# Patient Record
Sex: Female | Born: 1955
Health system: Southern US, Community
[De-identification: ages and names within clinical notes are randomized; demographics above are authoritative.]

## PROBLEM LIST (undated history)

## (undated) DIAGNOSIS — Z1379 Encounter for other screening for genetic and chromosomal anomalies: Principal | ICD-10-CM

## (undated) DIAGNOSIS — Z973 Presence of spectacles and contact lenses: Secondary | ICD-10-CM

## (undated) DIAGNOSIS — Z923 Personal history of irradiation: Secondary | ICD-10-CM

## (undated) DIAGNOSIS — H409 Unspecified glaucoma: Secondary | ICD-10-CM

## (undated) DIAGNOSIS — Z87442 Personal history of urinary calculi: Secondary | ICD-10-CM

## (undated) DIAGNOSIS — C801 Malignant (primary) neoplasm, unspecified: Secondary | ICD-10-CM

## (undated) DIAGNOSIS — M549 Dorsalgia, unspecified: Secondary | ICD-10-CM

## (undated) DIAGNOSIS — R21 Rash and other nonspecific skin eruption: Secondary | ICD-10-CM

## (undated) DIAGNOSIS — I10 Essential (primary) hypertension: Secondary | ICD-10-CM

## (undated) DIAGNOSIS — K219 Gastro-esophageal reflux disease without esophagitis: Secondary | ICD-10-CM

## (undated) DIAGNOSIS — G8929 Other chronic pain: Secondary | ICD-10-CM

## (undated) DIAGNOSIS — N611 Abscess of the breast and nipple: Secondary | ICD-10-CM

## (undated) DIAGNOSIS — F329 Major depressive disorder, single episode, unspecified: Secondary | ICD-10-CM

## (undated) DIAGNOSIS — F32A Depression, unspecified: Secondary | ICD-10-CM

## (undated) DIAGNOSIS — F419 Anxiety disorder, unspecified: Secondary | ICD-10-CM

## (undated) DIAGNOSIS — E119 Type 2 diabetes mellitus without complications: Secondary | ICD-10-CM

## (undated) DIAGNOSIS — Z972 Presence of dental prosthetic device (complete) (partial): Secondary | ICD-10-CM

## (undated) DIAGNOSIS — K861 Other chronic pancreatitis: Secondary | ICD-10-CM

## (undated) DIAGNOSIS — E7800 Pure hypercholesterolemia, unspecified: Secondary | ICD-10-CM

## (undated) HISTORY — DX: Encounter for other screening for genetic and chromosomal anomalies: Z13.79

## (undated) HISTORY — PX: BREAST EXCISIONAL BIOPSY: SUR124

## (undated) HISTORY — PX: BREAST REDUCTION SURGERY: SHX8

## (undated) HISTORY — DX: Unspecified glaucoma: H40.9

## (undated) HISTORY — PX: TUBAL LIGATION: SHX77

## (undated) HISTORY — PX: PARTIAL HYSTERECTOMY: SHX80

## (undated) HISTORY — DX: Other chronic pain: G89.29

## (undated) HISTORY — DX: Pure hypercholesterolemia, unspecified: E78.00

## (undated) HISTORY — DX: Other chronic pancreatitis: K86.1

## (undated) HISTORY — PX: ABDOMINAL HYSTERECTOMY: SHX81

## (undated) HISTORY — DX: Essential (primary) hypertension: I10

## (undated) HISTORY — PX: APPENDECTOMY: SHX54

## (undated) HISTORY — PX: REDUCTION MAMMAPLASTY: SUR839

## (undated) HISTORY — PX: COLONOSCOPY W/ BIOPSIES AND POLYPECTOMY: SHX1376

## (undated) HISTORY — PX: MULTIPLE TOOTH EXTRACTIONS: SHX2053

## (undated) HISTORY — DX: Dorsalgia, unspecified: M54.9

## (undated) HISTORY — PX: OTHER SURGICAL HISTORY: SHX169

## (undated) HISTORY — PX: CHOLECYSTECTOMY: SHX55

## (undated) HISTORY — DX: Type 2 diabetes mellitus without complications: E11.9

## (undated) HISTORY — PX: BACK SURGERY: SHX140

---

## 2004-01-13 ENCOUNTER — Ambulatory Visit: Payer: Self-pay | Admitting: Internal Medicine

## 2004-03-16 ENCOUNTER — Ambulatory Visit: Payer: Self-pay | Admitting: Internal Medicine

## 2004-04-15 ENCOUNTER — Ambulatory Visit: Payer: Self-pay | Admitting: Internal Medicine

## 2004-04-15 ENCOUNTER — Ambulatory Visit (HOSPITAL_COMMUNITY): Admission: RE | Admit: 2004-04-15 | Discharge: 2004-04-15 | Payer: Self-pay | Admitting: Internal Medicine

## 2004-04-22 ENCOUNTER — Ambulatory Visit: Payer: Self-pay | Admitting: Internal Medicine

## 2006-01-27 ENCOUNTER — Inpatient Hospital Stay (HOSPITAL_COMMUNITY): Admission: RE | Admit: 2006-01-27 | Discharge: 2006-01-29 | Payer: Self-pay | Admitting: Neurosurgery

## 2009-04-24 ENCOUNTER — Encounter: Admission: RE | Admit: 2009-04-24 | Discharge: 2009-04-24 | Payer: Self-pay | Admitting: Neurosurgery

## 2009-07-03 ENCOUNTER — Ambulatory Visit (HOSPITAL_COMMUNITY): Admission: RE | Admit: 2009-07-03 | Discharge: 2009-07-04 | Payer: Self-pay | Admitting: Neurosurgery

## 2009-08-17 ENCOUNTER — Encounter: Admission: RE | Admit: 2009-08-17 | Discharge: 2009-08-17 | Payer: Self-pay | Admitting: Neurosurgery

## 2009-10-27 ENCOUNTER — Encounter: Admission: RE | Admit: 2009-10-27 | Discharge: 2009-10-27 | Payer: Self-pay | Admitting: Neurosurgery

## 2009-12-08 ENCOUNTER — Ambulatory Visit: Payer: Self-pay | Admitting: Thoracic Surgery

## 2009-12-15 ENCOUNTER — Ambulatory Visit: Payer: Self-pay | Admitting: Thoracic Surgery

## 2009-12-15 ENCOUNTER — Inpatient Hospital Stay (HOSPITAL_COMMUNITY): Admission: RE | Admit: 2009-12-15 | Discharge: 2009-12-21 | Payer: Self-pay | Admitting: Neurosurgery

## 2009-12-29 ENCOUNTER — Encounter: Admission: RE | Admit: 2009-12-29 | Discharge: 2009-12-29 | Payer: Self-pay | Admitting: Thoracic Surgery

## 2009-12-29 ENCOUNTER — Ambulatory Visit: Payer: Self-pay | Admitting: Thoracic Surgery

## 2010-01-01 ENCOUNTER — Ambulatory Visit (HOSPITAL_COMMUNITY): Admission: RE | Admit: 2010-01-01 | Discharge: 2010-01-01 | Payer: Self-pay | Admitting: Thoracic Surgery

## 2010-01-05 ENCOUNTER — Encounter: Admission: RE | Admit: 2010-01-05 | Discharge: 2010-01-05 | Payer: Self-pay | Admitting: Thoracic Surgery

## 2010-01-05 ENCOUNTER — Ambulatory Visit: Payer: Self-pay | Admitting: Thoracic Surgery

## 2010-01-20 ENCOUNTER — Ambulatory Visit: Payer: Self-pay | Admitting: Thoracic Surgery

## 2010-01-20 ENCOUNTER — Encounter
Admission: RE | Admit: 2010-01-20 | Discharge: 2010-01-20 | Payer: Self-pay | Source: Home / Self Care | Attending: Thoracic Surgery | Admitting: Thoracic Surgery

## 2010-02-24 ENCOUNTER — Encounter
Admission: RE | Admit: 2010-02-24 | Discharge: 2010-02-24 | Payer: Self-pay | Source: Home / Self Care | Attending: Thoracic Surgery | Admitting: Thoracic Surgery

## 2010-02-24 ENCOUNTER — Ambulatory Visit
Admission: RE | Admit: 2010-02-24 | Discharge: 2010-02-24 | Payer: Self-pay | Source: Home / Self Care | Attending: Thoracic Surgery | Admitting: Thoracic Surgery

## 2010-03-31 ENCOUNTER — Other Ambulatory Visit: Payer: Self-pay | Admitting: Neurosurgery

## 2010-03-31 DIAGNOSIS — M549 Dorsalgia, unspecified: Secondary | ICD-10-CM

## 2010-03-31 DIAGNOSIS — M545 Low back pain: Secondary | ICD-10-CM

## 2010-04-20 LAB — DIFFERENTIAL
Eosinophils Absolute: 0.3 10*3/uL (ref 0.0–0.7)
Eosinophils Relative: 4 % (ref 0–5)
Lymphs Abs: 2.8 10*3/uL (ref 0.7–4.0)

## 2010-04-20 LAB — BASIC METABOLIC PANEL
BUN: 5 mg/dL — ABNORMAL LOW (ref 6–23)
CO2: 27 mEq/L (ref 19–32)
CO2: 28 mEq/L (ref 19–32)
Calcium: 8.5 mg/dL (ref 8.4–10.5)
Chloride: 106 mEq/L (ref 96–112)
Creatinine, Ser: 0.73 mg/dL (ref 0.4–1.2)
Creatinine, Ser: 0.79 mg/dL (ref 0.4–1.2)
GFR calc Af Amer: >60 mL/min (ref >60)
Glucose, Bld: 111 mg/dL — ABNORMAL HIGH (ref 70–99)
Glucose, Bld: 124 mg/dL — ABNORMAL HIGH (ref 70–99)

## 2010-04-20 LAB — COMPREHENSIVE METABOLIC PANEL
ALT: 31 U/L (ref 0–35)
Albumin: 3 g/dL — ABNORMAL LOW (ref 3.5–5.2)
Alkaline Phosphatase: 63 U/L (ref 39–117)
BUN: 11 mg/dL (ref 6–23)
Calcium: 8.1 mg/dL — ABNORMAL LOW (ref 8.4–10.5)
GFR calc non Af Amer: >60 mL/min (ref >60)
Glucose, Bld: 101 mg/dL — ABNORMAL HIGH (ref 70–99)
Potassium: 4.3 mEq/L (ref 3.5–5.1)
Total Protein: 5.5 g/dL — ABNORMAL LOW (ref 6.0–8.3)

## 2010-04-20 LAB — CBC
HCT: 38.4 % (ref 36.0–46.0)
Hemoglobin: 12.4 g/dL (ref 12.0–15.0)
MCH: 29 pg (ref 26.0–34.0)
MCH: 29.4 pg (ref 26.0–34.0)
MCHC: 32.3 g/dL (ref 30.0–36.0)
MCHC: 33.8 g/dL (ref 30.0–36.0)
MCV: 87.1 fL (ref 78.0–100.0)
Platelets: 251 10*3/uL (ref 150–400)
RBC: 5.27 MIL/uL — ABNORMAL HIGH (ref 3.87–5.11)

## 2010-04-20 LAB — URINE CULTURE
Culture  Setup Time: 201111080000
Culture: NO GROWTH

## 2010-04-20 LAB — TYPE AND SCREEN
ABO/RH(D): A NEG
Antibody Screen: NEGATIVE
Unit division: 0
Unit division: 0

## 2010-04-20 LAB — URINALYSIS, ROUTINE W REFLEX MICROSCOPIC
Bilirubin Urine: NEGATIVE
Ketones, ur: NEGATIVE mg/dL
Nitrite: NEGATIVE
Urobilinogen, UA: 0.2 mg/dL (ref 0.0–1.0)

## 2010-04-20 LAB — SURGICAL PCR SCREEN: Staphylococcus aureus: NEGATIVE

## 2010-04-26 LAB — BASIC METABOLIC PANEL
CO2: 28 mEq/L (ref 19–32)
Calcium: 9.7 mg/dL (ref 8.4–10.5)
GFR calc Af Amer: >60 mL/min (ref >60)
Sodium: 139 mEq/L (ref 135–145)

## 2010-04-26 LAB — GLUCOSE, CAPILLARY
Glucose-Capillary: 104 mg/dL — ABNORMAL HIGH (ref 70–99)
Glucose-Capillary: 162 mg/dL — ABNORMAL HIGH (ref 70–99)

## 2010-04-26 LAB — TYPE AND SCREEN
ABO/RH(D): A NEG
Antibody Screen: NEGATIVE

## 2010-04-26 LAB — CBC
Hemoglobin: 15.6 g/dL — ABNORMAL HIGH (ref 12.0–15.0)
MCHC: 34.3 g/dL (ref 30.0–36.0)
RBC: 5.04 MIL/uL (ref 3.87–5.11)
WBC: 9.8 10*3/uL (ref 4.0–10.5)

## 2010-04-26 LAB — DIFFERENTIAL
Basophils Relative: 1 % (ref 0–1)
Lymphocytes Relative: 34 % (ref 12–46)
Lymphs Abs: 3.4 10*3/uL (ref 0.7–4.0)
Monocytes Absolute: 0.5 10*3/uL (ref 0.1–1.0)
Monocytes Relative: 5 % (ref 3–12)
Neutro Abs: 5.6 10*3/uL (ref 1.7–7.7)

## 2010-04-26 LAB — ABO/RH: ABO/RH(D): A NEG

## 2010-04-26 LAB — SURGICAL PCR SCREEN: MRSA, PCR: NEGATIVE

## 2010-05-05 ENCOUNTER — Ambulatory Visit
Admission: RE | Admit: 2010-05-05 | Discharge: 2010-05-05 | Disposition: A | Discharge status: Home / Self Care | Payer: BC Managed Care – PPO | Source: Ambulatory Visit | Attending: Neurosurgery | Admitting: Neurosurgery

## 2010-05-05 ENCOUNTER — Other Ambulatory Visit: Payer: Self-pay

## 2010-05-05 DIAGNOSIS — M549 Dorsalgia, unspecified: Secondary | ICD-10-CM

## 2010-05-05 DIAGNOSIS — M545 Low back pain: Secondary | ICD-10-CM

## 2010-05-06 NOTE — H&P (Addendum)
  NAMESAYWARD, HORVATH NO.:  [id number]  MEDICAL RECORD NO.:  [id number]           PATIENT TYPE:  LOCATION:                                 FACILITY:  PHYSICIAN:  Dalia Heading, M.D.  DATE OF BIRTH:  1955-04-16  DATE OF ADMISSION: DATE OF DISCHARGE:  LH                             HISTORY & PHYSICAL   CHIEF COMPLAINT:  Functional port.  HISTORY OF PRESENT ILLNESS:  The patient is a 55 year old white female who is referred for Port-A-Cath removal.  It was placed 2 years ago by Dr. Cleotis Nipper for recurrent pancreatitis.  It got infected recently and is not functioning.  PAST MEDICAL HISTORY:  Is as noted above, hypertension, non-insulin- dependent diabetes mellitus.  PAST SURGICAL HISTORY:  Hysterectomy, appendectomy, cholecystectomy, breast reduction, shoulder surgery, two back surgeries.  CURRENT MEDICATIONS:  Potassium supplements, lisinopril, Cymbalta, metformin, Lipitor, Norvasc, Pangestyme, Dilaudid, Skelaxin, Xanax, fentanyl.  ALLERGIES:  AMPICILLIN, ORAL CONTRAST.  REVIEW OF SYSTEMS:  The patient does smoke half pack cigarettes a day. She does not drink alcohol.  She denies any other cardiopulmonary difficulties or bleeding disorders.  FAMILY MEDICAL HISTORY:  Noncontributory.  PHYSICAL EXAMINATION:  GENERAL:  The patient is a well-developed, well- nourished white female in no acute distress. HEENT:  Unremarkable. LUNGS:  Clear to auscultation with equal breath sounds bilaterally. HEART:  Regular rate and rhythm without S3, S4, or murmurs. SKIN:  A dressing over a partially exposed port at the left upper chest.  IMPRESSION:  Dysfunctional Port-A-Cath.  PLAN:  The patient is scheduled for Port-A-Cath removal on May 07, 2010.  The risks and benefits of the procedure including bleeding and infection were fully explained to the patient, gave informed consent. The port will be replaced once her wound has healed.     Dalia Heading,  M.D.     MAJ/MEDQ  D:  05/04/2010  T:  05/04/2010  Job:  [id number]  cc:   Lia Hopping Fax: [phone]  Electronically Signed by Franky Macho M.D. on 05/06/2010 11:50:06 AM

## 2010-05-07 ENCOUNTER — Ambulatory Visit (HOSPITAL_COMMUNITY)
Admission: RE | Admit: 2010-05-07 | Discharge: 2010-05-07 | Disposition: A | Discharge status: Home / Self Care | Payer: BC Managed Care – PPO | Source: Ambulatory Visit | Attending: General Surgery | Admitting: General Surgery

## 2010-05-07 DIAGNOSIS — Y849 Medical procedure, unspecified as the cause of abnormal reaction of the patient, or of later complication, without mention of misadventure at the time of the procedure: Secondary | ICD-10-CM | POA: Insufficient documentation

## 2010-05-07 DIAGNOSIS — T82598A Other mechanical complication of other cardiac and vascular devices and implants, initial encounter: Secondary | ICD-10-CM | POA: Insufficient documentation

## 2010-05-10 NOTE — Op Note (Signed)
  NAMEJENIFFER, [name] NO.:  [id number]  MEDICAL RECORD NO.:  [id number]           PATIENT TYPE:  O  LOCATION:  DAYP                          FACILITY:  [facility]  PHYSICIAN:  Dalia Heading, M.D.  DATE OF BIRTH:  11/06/1955  DATE OF PROCEDURE:  05/07/2010 DATE OF DISCHARGE:                              OPERATIVE REPORT   PREOPERATIVE DIAGNOSIS:  Dysfunctional Port-A-Cath.  POSTOPERATIVE DIAGNOSIS:  Dysfunctional Port-A-Cath.  PROCEDURE:  Port-A-Cath removal.  SURGEON:  Dalia Heading, MD  ANESTHESIA:  Local  INDICATIONS:  The patient is a 55 year old white female who had a Port-A- Cath placed 2 years ago in [place], [place] who now presents with a dysfunctional port with skin loss over the port.  The port is exposed. Risks and benefits of procedure were fully explained to the patient, gave informed consent.  PROCEDURE NOTE:  The patient was placed in supine position.  The left upper chest was prepped and draped using usual sterile technique with Betadine.  Surgical site confirmation was performed.  Xylocaine 1% was used for local anesthesia.  As stated earlier, there was skin erosion and loss over the port and the port was exposed.  The necrotic tissue was debrided and the incision was somewhat extended in order to facilitate port removal.  The port was removed without difficulty.  It was disposed.  The wound was irrigated with normal saline.  The skin was reapproximated loosely using 4-0 nylon interrupted sutures.  Betadine ointment and dry sterile dressings were applied.  All tape and needle counts were correct at the end of the procedure. The patient was transferred to Day Surgery in stable condition.  COMPLICATIONS:  None.  SPECIMEN:  Port, disposed.  BLOOD LOSS:  Minimal.     Dalia Heading, M.D.     MAJ/MEDQ  D:  05/07/2010  T:  05/08/2010  Job:  [id number]  cc:   Lia Hopping Fax: [phone]  Electronically Signed by Franky Macho  M.D. on 05/10/2010 08:22:36 AM

## 2010-05-19 NOTE — Discharge Summary (Signed)
  NAMEELISHEVA, Tabitha [name] NO.:  [id number]  MEDICAL RECORD NO.:  [id number]          PATIENT TYPE:  INP  LOCATION:  3004                         FACILITY:  [facility]  PHYSICIAN:  Kathaleen Maser. Mariana Goytia, M.D.    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  OPERATION AND TREATMENT:  Left T9-10 transthoracic microdiskectomy with T9-10 interbody cage fusion utilizing rib autograft and bone morphogenic protein.  HISTORY OF PRESENT ILLNESS:  [name] is a 55 year old female with history of thoracic pain with thoracic myelopathy.  Workup demonstrates evidence of spinal stenosis at the T9-10 level secondary to a broad- based disk herniation, osteophytic protrusion causing spinal cord compression.  The patient presents now for transthoracic decompression and fusion.  HOSPITAL COURSE:  The patient was taken to the operating room where uncomplicated left thoracotomy and T9-10 microdiskectomy followed by T9- 10 interbody fusion was performed.  Postoperatively, the patient awakened with intact neurological function.  She had great deal of incisional/post thoracotomy pain.  She was gradually mobilized. Subsequent chest x-rays demonstrated no evidence of significant pneumothorax.  Dr. Edwyna Shell of the [facility] removed her two chest tubes.  She was gradually mobilized with the aid of physical and occupational therapies.  She had some mild difficulty regaining normal bowel function, but this improved and at the time of discharge, the patient was tolerating regular diet.  She is ambulating with minimal assistance of a walker.  Her wound was healing well and overall her pain seemed to be getting better.  CONDITION AT DISCHARGE:  Improving.  DISCHARGE DISPOSITION:  The patient to be discharged home.  She  will follow up in my office in 1 week.  She will also follow up with Dr. Edwyna Shell.          ______________________________ Kathaleen Maser Luan Maberry, M.D.     HAP/MEDQ  D:  05/06/2010  T:  05/07/2010  Job:  [id number]  Electronically Signed by Julio Sicks M.D. on 05/19/2010 03:23:39 PM

## 2010-05-25 ENCOUNTER — Other Ambulatory Visit: Payer: Self-pay | Admitting: Neurosurgery

## 2010-05-25 ENCOUNTER — Other Ambulatory Visit: Payer: Self-pay | Admitting: Thoracic Surgery

## 2010-05-25 DIAGNOSIS — M5124 Other intervertebral disc displacement, thoracic region: Secondary | ICD-10-CM

## 2010-05-26 ENCOUNTER — Ambulatory Visit
Admission: RE | Admit: 2010-05-26 | Discharge: 2010-05-26 | Disposition: A | Discharge status: Home / Self Care | Payer: BC Managed Care – PPO | Source: Ambulatory Visit | Attending: Thoracic Surgery | Admitting: Thoracic Surgery

## 2010-05-26 ENCOUNTER — Ambulatory Visit (INDEPENDENT_AMBULATORY_CARE_PROVIDER_SITE_OTHER): Payer: BC Managed Care – PPO | Admitting: Thoracic Surgery

## 2010-05-26 DIAGNOSIS — M5124 Other intervertebral disc displacement, thoracic region: Secondary | ICD-10-CM

## 2010-05-27 NOTE — Assessment & Plan Note (Signed)
OFFICE VISIT  TREVOR, DUTY DOB:  1955/03/10                                        May 26, 2010 CHART #:  [id number]  The patient came for followup today.  Her chest x-ray is stable shows normal postoperative change in the right side.  She complains of some pain in the right lower quadrant which is different from her left thoracotomy incision and mostly has some nerve impingement on the right side.  Overall though she is doing satisfactory, we plan to release her back to Dr. Jordan Likes, and we will see her again if she has any future problems.  Ines Bloomer, M.D. Electronically Signed  DPB/MEDQ  D:  05/26/2010  T:  05/27/2010  Job:  [id number]

## 2010-06-03 DIAGNOSIS — Z0271 Encounter for disability determination: Secondary | ICD-10-CM

## 2010-06-22 NOTE — Letter (Signed)
January 20, 2010   Sherilyn Cooter A. Pool, MD  [address]  [place], [place] [address]   Re:  Tabitha [name], Tabitha [name]              DOB:  1955-12-28   Dear Sherilyn Cooter:   The patient came today.  Her incisions are well healed.  Her blood  pressure is 135/80, pulse 100, respirations 20, sats are 97%.  Chest x-  ray showed some reaction and loculated fluid, but I think this is  improved and I think she will continue to improve.  She is doing much  better than I saw her last time.  I will see her back again in 4 weeks  with another chest x-ray.   Ines Bloomer, M.D.  Electronically Signed   DPB/MEDQ  D:  01/20/2010  T:  01/21/2010  Job:  [id number]

## 2010-06-22 NOTE — Letter (Signed)
February 24, 2010   Sherilyn Cooter A. Pool, MD  [address]  [place], [place] [address]   Re:  Tabitha [name], Tabitha [name]              DOB:  03-27-1955   Dear Mardelle Matte:   I saw the patient back today and her chest x-ray was stable, still  showed postoperative changes on the left and she has some numbness.  Her  blood pressure was 135/76, pulse 100, respirations 20, and sats were  97%, and overall she is stable.  I will see her back in 3 months with a  chest x-ray for a final check.   Sincerely,   Ines Bloomer, M.D.  Electronically Signed   DPB/MEDQ  D:  02/24/2010  T:  02/24/2010  Job:  [id number]

## 2010-06-22 NOTE — Assessment & Plan Note (Signed)
OFFICE VISIT   BERKLEE, BATTEY  DOB:  Dec 16, 1955                                        January 05, 2010  CHART #:  [id number]   The patient is status post exposure of the T9-T10 spondylosis  ___________.  Exposure was done by Dr. Edwyna Shell in December 15, 2009.  The  patient was last seen in the office December 29, 2009.  Since then, the  patient underwent thoracentesis on the last Friday, January 01, 2010,  by Radiology.  600 mL of bloody fluid was removed.  The patient's  breathing did improve following.  The patient states she is still in  significant amount of pain.  She is taking Dilaudid for her pain, and is  trying to wean off, because she does not want to become dependent.  She  is up ambulating better.  She continues to work with Physical Therapy.  At time, she is able to ambulate without a walker.  She has a followup  appointment with Dr. Jordan Likes in 3 weeks.   PHYSICAL EXAMINATION:  Vital Signs:  Blood pressure 123/81, pulse 86,  respirations of 16, O2 sats 100% on room air.  Respiratory:  Diminished  breath sounds at the left base.  Cardiac:  Regular rate and rhythm.  Incisions:  All clean, dry and intact and healing well.   STUDIES:  The patient had PA and lateral chest x-ray obtained today,  which shows a persistent small left pleural effusion noted.  No signs of  pneumothorax noted.   IMPRESSION AND PLAN:  The patient is progressing well postoperatively.  He chest x-ray did show improvement for left pleural effusion.  At this  time, the patient is continue using her incentive spirometer.  She is to  continue activity as per Dr. Jordan Likes and continue taking pain medication as  per Dr. Jordan Likes.  We will bring the patient back in 2 weeks with repeat PA  lateral chest x-ray for further followup of her left pleural effusion.  The patient was told if she has any  surgical issues in the interim she is to contact Dr. Jordan Likes at Dr.  Scheryl Darter office.  The  patient is in agreement.   Sol Blazing, PA   KMD/MEDQ  D:  01/05/2010  T:  01/06/2010  Job:  [id number]   cc:   Ines Bloomer, M.D.  Henry A. Pool, M.D.

## 2010-06-22 NOTE — Letter (Signed)
December 08, 2009   Sherilyn Cooter A. Pool, MD  [address]  [place], [place] [address]   Re:  Tabitha [name], Tabitha [name]              DOB:  07-13-1955   Dear Sherilyn Cooter:   I saw the patient in the office today.  This 55 year old patient has had  right flank pain and underwent a laminectomy previously but had  recurrent pain and recurrent CT scan shows still distal profusion that  T9-10.  She is referred here for exposure for a diskectomy with a  microdiskectomy followed by interbody fusion and local bone graft and  lateral plate fixation.  The question is whether to do this from the  right side or the left side.   MEDICATIONS:  Potassium, lisinopril, Cymbalta, metformin, Lipitor,  Norvasc, Pangestyme, Skelaxin, Percocet, fentanyl.   ALLERGIES:  She is allergic to ampicillin and oral contrast.   PAST MEDICAL HISTORY:  She has hyperlipidemia, diabetes mellitus type 2,  hypertension.   FAMILY HISTORY:  Noncontributory.   SOCIAL HISTORY:  She is married and has 2 children.  She is a Chief Operating Officer.  Smokes a pack of cigarettes a day.  Does not drink  alcohol on a regular basis.   REVIEW OF SYSTEMS:  GENERAL:  She is 198 pounds, 5 feet 5 inches.  In  general, her weight has been stable.  CARDIAC:  No angina or atrial fibrillation.  PULMONARY:  No hemoptysis, bronchitis, and wheezing.  GI:  She has diarrhea and bouts of pancreatitis and abdominal pain.  GU:  No kidney disease, dysuria, or frequent urination.  VASCULAR:  No claudication, DVT, TIAs.  NEUROLOGIC:  No dizziness, headaches, blackouts, or seizures.  MUSCULOSKELETAL:  No arthritis, but see history of present illness.  PSYCHIATRIC:  Depression.  EYE/ENT:  No change in eyesight or hearing.  HEMATOLOGIC:  No problems with bleeding, clotting disorders, or anemia.   PHYSICAL EXAMINATION:  Her blood pressure is 132/86, pulse 81,  respirations 18, sats are 94%.  Head, eyes, ears, nose, and throat are  unremarkable.  Neck is  supple without thyromegaly.  There is no  supraclavicular or axillary5 adenopathy.  Chest is clear to auscultation  and percussion.  Heart regular sinus rhythm.  No murmurs.  Abdomen is  soft and obese.  Bowel sounds are normal.  There is no splenomegaly.  Extremity pulses are 2+.  There is no clubbing or edema.  Neurological,  she is oriented x3.  Sensory and motor intact.  Cranial nerves intact.   ASSESSMENT AND PLAN:  As I mentioned, I think this can be exposed from  the right or the left.  I will call and discuss this with you.  We plan  to do this on December 15, 2009, and I explained to her the risks of the  surgery including myocardial infarction, pulmonary embolus, infection,  hemorrhage, and pneumonia.  She understands the risk and agrees to the  surgery.   Ines Bloomer, M.D.  Electronically Signed   DPB/MEDQ  D:  12/08/2009  T:  12/09/2009  Job:  [id number]

## 2010-06-22 NOTE — Letter (Signed)
December 29, 2009   Sherilyn Cooter A. Pool, M.D.  [address] [place] [address]   Re:  SHIRL, WEIR              DOB:  [birth year]   Dear Mardelle Matte,   I saw the patient back today.  Her incision is healing satisfactory;  however, her chest x-ray shows loculated left pleural effusion.  She is  going to have a thoracentesis done since it has been sore, it maybe  slightly bigger.  I will arrange to have that done either tomorrow or on  Friday.  I will see her back again next week with another chest x-ray.  Her blood pressure is 146/85, pulse 100, respirations 18, saturations  were 96%.   Ines Bloomer, M.D.  Electronically Signed   DPB/MEDQ  D:  12/29/2009  T:  12/30/2009  Job:  [id number]

## 2010-06-25 NOTE — Op Note (Signed)
[name], [name]              ACCOUNT NO.:  [id number]   MEDICAL RECORD NO.:  [id number]          PATIENT TYPE:  AMB   LOCATION:  DAY                           FACILITY:  [facility]   PHYSICIAN:  Lionel December, M.D.    DATE OF BIRTH:  [birth year]   DATE OF PROCEDURE:  04/15/2004  DATE OF DISCHARGE:                                 OPERATIVE REPORT   PROCEDURE:  ERCP with endoscopic sphincterotomy (biliary sphincterotomy).   INDICATION:  Tabitha [name] is a 55 year old Caucasian female with multiple medical  problems who has chronic right upper quadrant pain with nausea and vomiting,  and she also has chronic right lower quadrant pain. She has had extensive  evaluation. She was noted to have dilated CBD CHD measuring 16 mL; however,  LFTs were repeatedly normal. Therefore, she had an EUS at [facility] revealing  microlithiasis. She also had enlarged lymph nodes which were FNA'd.  She  also had enlarged lymph nodes and FNA revealed reactive hyperplasia. She is  undergoing ERCP with sphincterotomy. Procedure risks were reviewed the  patient, and informed consent was obtained.   PREMEDICATION:  Please see anesthesia record for details.   FINDINGS:  Procedure performed in OR. After the patient was placed under  anesthesia and intubated, she was positioned in a semi-prone position.  Therapeutic Olympus video duodenoscope was passed via oropharynx into  esophagus. Mucosa of the esophagus normal. Scope was advanced into stomach  with moderate to large amount of food. Antral mucosa was normal. Pyloric  channel was patent. Scope was advanced into bulb and descending duodenum.  Ampulla of Vater appeared normal. There was a peri-ampullary  diverticulum.  Intramural segment was short. Cannulation was attempted with RX44 autotome  and 035 __________  jag wire. The PD was initially cannulated, and dilute  contrast was injected gently. There was partially filled and  prominent  minor system, but there were no  filling defects or stricture noted. CBD was  then selectively cannulated and filled with dilute contrast. CBD and CHD  were dilated. There was segmental sacculation or dilation to hepatic duct.  It measured 15-16 mm. CBD distally was 10-12 mm and tapered nicely. There  were no filling defects. Intrahepatic biliary radicals were normal.  Endoscopic sphincterotomy was performed with a gush of contrast and bile  into the duodenum. Subsequently partially filled balloon to a diameter of 7  to 8 mm was also trolled through the duct, and no stone debris was removed.  Endoscope was withdrawn. The patient tolerated the procedure well. She is in  the process of being extubated.   FINAL DIAGNOSIS:  Moderate to large amount of food in stomach with a patent  pylorus indicative of gastroparesis which is possibly secondary to her  diabetes and narcotic therapy.   Dilated CBD and CHD but without stones. A biliary sphincterotomy performed  with excellent drainage.   PLAN:  1.  She will be monitored in day hospital and hopefully will be going home.  2.  Will start on Reglan 10 mg before each meal.  3.  She will return for OV in 2-4 weeks  from now.      NR/MEDQ  D:  04/15/2004  T:  04/15/2004  Job:  [id number]

## 2010-06-25 NOTE — Op Note (Signed)
Tabitha [name], Tabitha [name] NO.:  [id number]   MEDICAL RECORD NO.:  [id number]          PATIENT TYPE:  INP   LOCATION:  3005                         FACILITY:  [facility]   PHYSICIAN:  Hilda Lias, M.D.   DATE OF BIRTH:  November 26, 1955   DATE OF PROCEDURE:  01/27/2006  DATE OF DISCHARGE:                               OPERATIVE REPORT   PREOPERATIVE DIAGNOSES:  C5-C6, C6-C7 spondylosis with radiculopathy and  spinal stenosis.   POSTOPERATIVE DIAGNOSES:  C5-C6, C6-C7 spondylosis with radiculopathy  and spinal stenosis.   PROCEDURE:  Anterior C5-6 and 6-7 decompression of the spinal cord,  foraminotomy, interbody fusion with allograft, plate, microscope.   SURGEON:  Hilda Lias, M.D.   ASSISTANT:  Coletta Memos, M.D.   CLINICAL HISTORY:  The patient was admitted because of neck pain with  radiation into both upper extremities.  X-rays show spondylosis with a  stenosis at the levels of 5-6 and 6-7.  Surgery was advised, and the  risks were explained in the History and Physical.   DESCRIPTION OF PROCEDURE:  The patient was taken to the OR, and after  intubation, the neck was cleaned with DuraPrep.  A transverse incision  was made through the skin and subcutaneous tissue down to the cervical  area.  The x-rays showed that we were at the level of 5-6.  From then  on, we removed the anterior osteophyte with the drill, and with the help  of the microscope, we removed the anterior ligament, which was calcified  at both levels.  The excision of the ligament was made, and with the  drill as well as the curet, were did a total gross diskectomy.  We found  a calcified posterior ligament.  The disk was opened at the level of 5-6  in the midline, and we went laterally to decompress not only the spinal  cord, but also both C6 nerve roots.  The same procedure was done  essentially at the level of C6-C7 with the same results.  From then on,  the endplates were drilled and 2 pieces of  iliac crest of 7 mm height  were inserted, followed by a plate using 6 screws.  X-rays showed that  the upper part of the plate was in normal position.  The lower part was  difficult to see because of the shoulder.  Irrigation was made.  After  we accomplished good hemostasis, the wound was closed with Vicryl and  Steri-Strips.           ______________________________  Hilda Lias, M.D.     EB/MEDQ  D:  01/27/2006  T:  01/28/2006  Job:  [id number]   cc:   Coletta Memos, M.D.

## 2010-07-11 ENCOUNTER — Other Ambulatory Visit: Payer: Self-pay | Admitting: Neurosurgery

## 2010-07-11 DIAGNOSIS — M545 Low back pain, unspecified: Secondary | ICD-10-CM

## 2010-07-11 DIAGNOSIS — M549 Dorsalgia, unspecified: Secondary | ICD-10-CM

## 2010-07-11 DIAGNOSIS — R200 Anesthesia of skin: Secondary | ICD-10-CM

## 2010-07-11 DIAGNOSIS — M79604 Pain in right leg: Secondary | ICD-10-CM

## 2010-07-15 ENCOUNTER — Other Ambulatory Visit: Payer: BC Managed Care – PPO

## 2010-07-20 ENCOUNTER — Ambulatory Visit
Admission: RE | Admit: 2010-07-20 | Discharge: 2010-07-20 | Disposition: A | Discharge status: Home / Self Care | Payer: BC Managed Care – PPO | Source: Ambulatory Visit | Attending: Neurosurgery | Admitting: Neurosurgery

## 2010-07-20 DIAGNOSIS — M549 Dorsalgia, unspecified: Secondary | ICD-10-CM

## 2010-07-20 DIAGNOSIS — R200 Anesthesia of skin: Secondary | ICD-10-CM

## 2010-07-20 DIAGNOSIS — M79604 Pain in right leg: Secondary | ICD-10-CM

## 2010-07-20 DIAGNOSIS — M545 Low back pain: Secondary | ICD-10-CM

## 2010-07-20 DIAGNOSIS — M79605 Pain in left leg: Secondary | ICD-10-CM

## 2010-08-05 DIAGNOSIS — Z0271 Encounter for disability determination: Secondary | ICD-10-CM

## 2014-02-17 ENCOUNTER — Other Ambulatory Visit: Payer: Self-pay | Admitting: Neurosurgery

## 2014-02-17 DIAGNOSIS — M5416 Radiculopathy, lumbar region: Secondary | ICD-10-CM

## 2014-03-03 ENCOUNTER — Other Ambulatory Visit: Payer: Self-pay

## 2014-03-14 ENCOUNTER — Ambulatory Visit
Admission: RE | Admit: 2014-03-14 | Discharge: 2014-03-14 | Disposition: A | Discharge status: Home / Self Care | Payer: Medicare Other | Source: Ambulatory Visit | Attending: Neurosurgery | Admitting: Neurosurgery

## 2014-03-14 DIAGNOSIS — M5416 Radiculopathy, lumbar region: Secondary | ICD-10-CM

## 2014-03-14 MED ORDER — GADOBENATE DIMEGLUMINE 529 MG/ML IV SOLN
19.0000 mL | Freq: Once | INTRAVENOUS | Status: AC | PRN
  Administered 2014-03-14: 19 mL via INTRAVENOUS

## 2015-02-16 DIAGNOSIS — H401131 Primary open-angle glaucoma, bilateral, mild stage: Secondary | ICD-10-CM | POA: Diagnosis not present

## 2015-03-16 DIAGNOSIS — Z01419 Encounter for gynecological examination (general) (routine) without abnormal findings: Secondary | ICD-10-CM | POA: Diagnosis not present

## 2015-03-16 DIAGNOSIS — Z6833 Body mass index (BMI) 33.0-33.9, adult: Secondary | ICD-10-CM | POA: Diagnosis not present

## 2015-03-19 DIAGNOSIS — Z1389 Encounter for screening for other disorder: Secondary | ICD-10-CM | POA: Diagnosis not present

## 2015-03-19 DIAGNOSIS — Z79899 Other long term (current) drug therapy: Secondary | ICD-10-CM | POA: Diagnosis not present

## 2015-03-19 DIAGNOSIS — I10 Essential (primary) hypertension: Secondary | ICD-10-CM | POA: Diagnosis not present

## 2015-03-19 DIAGNOSIS — Z6832 Body mass index (BMI) 32.0-32.9, adult: Secondary | ICD-10-CM | POA: Diagnosis not present

## 2015-03-19 DIAGNOSIS — Z0000 Encounter for general adult medical examination without abnormal findings: Secondary | ICD-10-CM | POA: Diagnosis not present

## 2015-03-19 DIAGNOSIS — M545 Low back pain: Secondary | ICD-10-CM | POA: Diagnosis not present

## 2015-03-19 DIAGNOSIS — E1142 Type 2 diabetes mellitus with diabetic polyneuropathy: Secondary | ICD-10-CM | POA: Diagnosis not present

## 2015-03-24 DIAGNOSIS — Z1389 Encounter for screening for other disorder: Secondary | ICD-10-CM | POA: Diagnosis not present

## 2015-03-26 DIAGNOSIS — G458 Other transient cerebral ischemic attacks and related syndromes: Secondary | ICD-10-CM | POA: Diagnosis not present

## 2015-03-26 DIAGNOSIS — I6523 Occlusion and stenosis of bilateral carotid arteries: Secondary | ICD-10-CM | POA: Diagnosis not present

## 2015-04-14 DIAGNOSIS — Z1231 Encounter for screening mammogram for malignant neoplasm of breast: Secondary | ICD-10-CM | POA: Diagnosis not present

## 2015-05-08 DIAGNOSIS — K8681 Exocrine pancreatic insufficiency: Secondary | ICD-10-CM | POA: Diagnosis not present

## 2015-05-08 DIAGNOSIS — K861 Other chronic pancreatitis: Secondary | ICD-10-CM | POA: Diagnosis not present

## 2015-05-19 DIAGNOSIS — Z6832 Body mass index (BMI) 32.0-32.9, adult: Secondary | ICD-10-CM | POA: Diagnosis not present

## 2015-05-19 DIAGNOSIS — E1142 Type 2 diabetes mellitus with diabetic polyneuropathy: Secondary | ICD-10-CM | POA: Diagnosis not present

## 2015-05-19 DIAGNOSIS — I10 Essential (primary) hypertension: Secondary | ICD-10-CM | POA: Diagnosis not present

## 2015-07-14 DIAGNOSIS — E119 Type 2 diabetes mellitus without complications: Secondary | ICD-10-CM | POA: Diagnosis not present

## 2015-07-14 DIAGNOSIS — H401131 Primary open-angle glaucoma, bilateral, mild stage: Secondary | ICD-10-CM | POA: Diagnosis not present

## 2015-07-20 DIAGNOSIS — Z6831 Body mass index (BMI) 31.0-31.9, adult: Secondary | ICD-10-CM | POA: Diagnosis not present

## 2015-07-20 DIAGNOSIS — E1142 Type 2 diabetes mellitus with diabetic polyneuropathy: Secondary | ICD-10-CM | POA: Diagnosis not present

## 2015-07-20 DIAGNOSIS — M545 Low back pain: Secondary | ICD-10-CM | POA: Diagnosis not present

## 2015-07-20 DIAGNOSIS — I10 Essential (primary) hypertension: Secondary | ICD-10-CM | POA: Diagnosis not present

## 2015-07-30 DIAGNOSIS — E785 Hyperlipidemia, unspecified: Secondary | ICD-10-CM | POA: Diagnosis not present

## 2015-07-30 DIAGNOSIS — F329 Major depressive disorder, single episode, unspecified: Secondary | ICD-10-CM | POA: Diagnosis not present

## 2015-07-30 DIAGNOSIS — I10 Essential (primary) hypertension: Secondary | ICD-10-CM | POA: Diagnosis not present

## 2015-07-30 DIAGNOSIS — K861 Other chronic pancreatitis: Secondary | ICD-10-CM | POA: Diagnosis not present

## 2015-07-30 DIAGNOSIS — Z79891 Long term (current) use of opiate analgesic: Secondary | ICD-10-CM | POA: Diagnosis not present

## 2015-07-30 DIAGNOSIS — G8929 Other chronic pain: Secondary | ICD-10-CM | POA: Diagnosis not present

## 2015-07-30 DIAGNOSIS — Z8042 Family history of malignant neoplasm of prostate: Secondary | ICD-10-CM | POA: Diagnosis not present

## 2015-07-30 DIAGNOSIS — Z888 Allergy status to other drugs, medicaments and biological substances status: Secondary | ICD-10-CM | POA: Diagnosis not present

## 2015-07-30 DIAGNOSIS — F17200 Nicotine dependence, unspecified, uncomplicated: Secondary | ICD-10-CM | POA: Diagnosis not present

## 2015-07-30 DIAGNOSIS — Z881 Allergy status to other antibiotic agents status: Secondary | ICD-10-CM | POA: Diagnosis not present

## 2015-07-30 DIAGNOSIS — E119 Type 2 diabetes mellitus without complications: Secondary | ICD-10-CM | POA: Diagnosis not present

## 2015-07-30 DIAGNOSIS — K859 Acute pancreatitis without necrosis or infection, unspecified: Secondary | ICD-10-CM | POA: Diagnosis not present

## 2015-07-30 DIAGNOSIS — Z87898 Personal history of other specified conditions: Secondary | ICD-10-CM | POA: Diagnosis not present

## 2015-07-30 DIAGNOSIS — Z800 Family history of malignant neoplasm of digestive organs: Secondary | ICD-10-CM | POA: Diagnosis not present

## 2015-07-30 DIAGNOSIS — M545 Low back pain: Secondary | ICD-10-CM | POA: Diagnosis not present

## 2015-07-30 DIAGNOSIS — Z7984 Long term (current) use of oral hypoglycemic drugs: Secondary | ICD-10-CM | POA: Diagnosis not present

## 2015-07-30 DIAGNOSIS — Z79899 Other long term (current) drug therapy: Secondary | ICD-10-CM | POA: Diagnosis not present

## 2015-07-31 DIAGNOSIS — E119 Type 2 diabetes mellitus without complications: Secondary | ICD-10-CM | POA: Diagnosis not present

## 2015-07-31 DIAGNOSIS — E784 Other hyperlipidemia: Secondary | ICD-10-CM | POA: Diagnosis not present

## 2015-07-31 DIAGNOSIS — K861 Other chronic pancreatitis: Secondary | ICD-10-CM | POA: Diagnosis not present

## 2015-07-31 DIAGNOSIS — I10 Essential (primary) hypertension: Secondary | ICD-10-CM | POA: Diagnosis not present

## 2015-07-31 DIAGNOSIS — K858 Other acute pancreatitis without necrosis or infection: Secondary | ICD-10-CM | POA: Diagnosis not present

## 2015-08-01 DIAGNOSIS — K858 Other acute pancreatitis without necrosis or infection: Secondary | ICD-10-CM | POA: Diagnosis not present

## 2015-08-01 DIAGNOSIS — K861 Other chronic pancreatitis: Secondary | ICD-10-CM | POA: Diagnosis not present

## 2015-08-01 DIAGNOSIS — E784 Other hyperlipidemia: Secondary | ICD-10-CM | POA: Diagnosis not present

## 2015-08-01 DIAGNOSIS — E119 Type 2 diabetes mellitus without complications: Secondary | ICD-10-CM | POA: Diagnosis not present

## 2015-08-01 DIAGNOSIS — I10 Essential (primary) hypertension: Secondary | ICD-10-CM | POA: Diagnosis not present

## 2015-08-03 DIAGNOSIS — K858 Other acute pancreatitis without necrosis or infection: Secondary | ICD-10-CM | POA: Diagnosis not present

## 2015-08-03 DIAGNOSIS — E119 Type 2 diabetes mellitus without complications: Secondary | ICD-10-CM | POA: Diagnosis not present

## 2015-08-03 DIAGNOSIS — K861 Other chronic pancreatitis: Secondary | ICD-10-CM | POA: Diagnosis not present

## 2015-08-03 DIAGNOSIS — E784 Other hyperlipidemia: Secondary | ICD-10-CM | POA: Diagnosis not present

## 2015-08-03 DIAGNOSIS — I10 Essential (primary) hypertension: Secondary | ICD-10-CM | POA: Diagnosis not present

## 2015-08-10 DIAGNOSIS — I10 Essential (primary) hypertension: Secondary | ICD-10-CM | POA: Diagnosis not present

## 2015-08-10 DIAGNOSIS — Z6831 Body mass index (BMI) 31.0-31.9, adult: Secondary | ICD-10-CM | POA: Diagnosis not present

## 2015-08-10 DIAGNOSIS — K861 Other chronic pancreatitis: Secondary | ICD-10-CM | POA: Diagnosis not present

## 2015-09-18 DIAGNOSIS — I10 Essential (primary) hypertension: Secondary | ICD-10-CM | POA: Diagnosis not present

## 2015-09-18 DIAGNOSIS — K861 Other chronic pancreatitis: Secondary | ICD-10-CM | POA: Diagnosis not present

## 2015-09-18 DIAGNOSIS — Z6831 Body mass index (BMI) 31.0-31.9, adult: Secondary | ICD-10-CM | POA: Diagnosis not present

## 2015-09-18 DIAGNOSIS — E784 Other hyperlipidemia: Secondary | ICD-10-CM | POA: Diagnosis not present

## 2015-09-18 DIAGNOSIS — E1142 Type 2 diabetes mellitus with diabetic polyneuropathy: Secondary | ICD-10-CM | POA: Diagnosis not present

## 2015-10-13 DIAGNOSIS — K8681 Exocrine pancreatic insufficiency: Secondary | ICD-10-CM | POA: Diagnosis not present

## 2015-10-13 DIAGNOSIS — E1142 Type 2 diabetes mellitus with diabetic polyneuropathy: Secondary | ICD-10-CM | POA: Diagnosis not present

## 2015-10-13 DIAGNOSIS — K861 Other chronic pancreatitis: Secondary | ICD-10-CM | POA: Diagnosis not present

## 2015-10-13 DIAGNOSIS — E784 Other hyperlipidemia: Secondary | ICD-10-CM | POA: Diagnosis not present

## 2015-10-13 DIAGNOSIS — I10 Essential (primary) hypertension: Secondary | ICD-10-CM | POA: Diagnosis not present

## 2015-11-19 DIAGNOSIS — E784 Other hyperlipidemia: Secondary | ICD-10-CM | POA: Diagnosis not present

## 2015-11-19 DIAGNOSIS — E1142 Type 2 diabetes mellitus with diabetic polyneuropathy: Secondary | ICD-10-CM | POA: Diagnosis not present

## 2015-11-19 DIAGNOSIS — I10 Essential (primary) hypertension: Secondary | ICD-10-CM | POA: Diagnosis not present

## 2015-11-19 DIAGNOSIS — Z6832 Body mass index (BMI) 32.0-32.9, adult: Secondary | ICD-10-CM | POA: Diagnosis not present

## 2015-11-19 DIAGNOSIS — K861 Other chronic pancreatitis: Secondary | ICD-10-CM | POA: Diagnosis not present

## 2015-12-03 DIAGNOSIS — E1142 Type 2 diabetes mellitus with diabetic polyneuropathy: Secondary | ICD-10-CM | POA: Diagnosis not present

## 2015-12-03 DIAGNOSIS — E784 Other hyperlipidemia: Secondary | ICD-10-CM | POA: Diagnosis not present

## 2015-12-03 DIAGNOSIS — I10 Essential (primary) hypertension: Secondary | ICD-10-CM | POA: Diagnosis not present

## 2015-12-03 DIAGNOSIS — K861 Other chronic pancreatitis: Secondary | ICD-10-CM | POA: Diagnosis not present

## 2015-12-24 DIAGNOSIS — G8929 Other chronic pain: Secondary | ICD-10-CM | POA: Diagnosis not present

## 2015-12-24 DIAGNOSIS — I10 Essential (primary) hypertension: Secondary | ICD-10-CM | POA: Diagnosis not present

## 2015-12-24 DIAGNOSIS — F329 Major depressive disorder, single episode, unspecified: Secondary | ICD-10-CM | POA: Diagnosis not present

## 2015-12-24 DIAGNOSIS — Z9049 Acquired absence of other specified parts of digestive tract: Secondary | ICD-10-CM | POA: Diagnosis not present

## 2015-12-24 DIAGNOSIS — Z888 Allergy status to other drugs, medicaments and biological substances status: Secondary | ICD-10-CM | POA: Diagnosis not present

## 2015-12-24 DIAGNOSIS — R112 Nausea with vomiting, unspecified: Secondary | ICD-10-CM | POA: Diagnosis not present

## 2015-12-24 DIAGNOSIS — Z7984 Long term (current) use of oral hypoglycemic drugs: Secondary | ICD-10-CM | POA: Diagnosis not present

## 2015-12-24 DIAGNOSIS — Z79899 Other long term (current) drug therapy: Secondary | ICD-10-CM | POA: Diagnosis not present

## 2015-12-24 DIAGNOSIS — M545 Low back pain: Secondary | ICD-10-CM | POA: Diagnosis not present

## 2015-12-24 DIAGNOSIS — E785 Hyperlipidemia, unspecified: Secondary | ICD-10-CM | POA: Diagnosis not present

## 2015-12-24 DIAGNOSIS — Z90710 Acquired absence of both cervix and uterus: Secondary | ICD-10-CM | POA: Diagnosis not present

## 2015-12-24 DIAGNOSIS — Z808 Family history of malignant neoplasm of other organs or systems: Secondary | ICD-10-CM | POA: Diagnosis not present

## 2015-12-24 DIAGNOSIS — K529 Noninfective gastroenteritis and colitis, unspecified: Secondary | ICD-10-CM | POA: Diagnosis not present

## 2015-12-24 DIAGNOSIS — Z881 Allergy status to other antibiotic agents status: Secondary | ICD-10-CM | POA: Diagnosis not present

## 2015-12-24 DIAGNOSIS — Z8042 Family history of malignant neoplasm of prostate: Secondary | ICD-10-CM | POA: Diagnosis not present

## 2015-12-24 DIAGNOSIS — E119 Type 2 diabetes mellitus without complications: Secondary | ICD-10-CM | POA: Diagnosis not present

## 2015-12-24 DIAGNOSIS — K861 Other chronic pancreatitis: Secondary | ICD-10-CM | POA: Diagnosis not present

## 2015-12-24 DIAGNOSIS — F17200 Nicotine dependence, unspecified, uncomplicated: Secondary | ICD-10-CM | POA: Diagnosis not present

## 2015-12-25 DIAGNOSIS — I10 Essential (primary) hypertension: Secondary | ICD-10-CM | POA: Diagnosis not present

## 2015-12-25 DIAGNOSIS — E1142 Type 2 diabetes mellitus with diabetic polyneuropathy: Secondary | ICD-10-CM | POA: Diagnosis not present

## 2015-12-25 DIAGNOSIS — E784 Other hyperlipidemia: Secondary | ICD-10-CM | POA: Diagnosis not present

## 2015-12-25 DIAGNOSIS — K861 Other chronic pancreatitis: Secondary | ICD-10-CM | POA: Diagnosis not present

## 2016-01-01 DIAGNOSIS — K2960 Other gastritis without bleeding: Secondary | ICD-10-CM | POA: Diagnosis not present

## 2016-01-01 DIAGNOSIS — Z6833 Body mass index (BMI) 33.0-33.9, adult: Secondary | ICD-10-CM | POA: Diagnosis not present

## 2016-01-19 ENCOUNTER — Encounter (INDEPENDENT_AMBULATORY_CARE_PROVIDER_SITE_OTHER): Payer: Self-pay | Admitting: Internal Medicine

## 2016-01-21 DIAGNOSIS — Z6833 Body mass index (BMI) 33.0-33.9, adult: Secondary | ICD-10-CM | POA: Diagnosis not present

## 2016-01-21 DIAGNOSIS — I10 Essential (primary) hypertension: Secondary | ICD-10-CM | POA: Diagnosis not present

## 2016-01-21 DIAGNOSIS — E1165 Type 2 diabetes mellitus with hyperglycemia: Secondary | ICD-10-CM | POA: Diagnosis not present

## 2016-01-21 DIAGNOSIS — E784 Other hyperlipidemia: Secondary | ICD-10-CM | POA: Diagnosis not present

## 2016-01-21 DIAGNOSIS — E119 Type 2 diabetes mellitus without complications: Secondary | ICD-10-CM | POA: Diagnosis not present

## 2016-01-21 DIAGNOSIS — M545 Low back pain: Secondary | ICD-10-CM | POA: Diagnosis not present

## 2016-01-27 ENCOUNTER — Encounter (INDEPENDENT_AMBULATORY_CARE_PROVIDER_SITE_OTHER): Payer: Self-pay | Admitting: *Deleted

## 2016-01-27 ENCOUNTER — Ambulatory Visit (INDEPENDENT_AMBULATORY_CARE_PROVIDER_SITE_OTHER): Payer: PPO | Admitting: Internal Medicine

## 2016-01-27 ENCOUNTER — Encounter (INDEPENDENT_AMBULATORY_CARE_PROVIDER_SITE_OTHER): Payer: Self-pay

## 2016-01-27 ENCOUNTER — Encounter (INDEPENDENT_AMBULATORY_CARE_PROVIDER_SITE_OTHER): Payer: Self-pay | Admitting: Internal Medicine

## 2016-01-27 VITALS — BP 140/100 | HR 71 | Resp 18 | Ht 64.0 in | Wt 193.4 lb

## 2016-01-27 DIAGNOSIS — H409 Unspecified glaucoma: Secondary | ICD-10-CM | POA: Insufficient documentation

## 2016-01-27 DIAGNOSIS — E7800 Pure hypercholesterolemia, unspecified: Secondary | ICD-10-CM

## 2016-01-27 DIAGNOSIS — R1013 Epigastric pain: Secondary | ICD-10-CM

## 2016-01-27 DIAGNOSIS — I10 Essential (primary) hypertension: Secondary | ICD-10-CM | POA: Diagnosis not present

## 2016-01-27 DIAGNOSIS — G8929 Other chronic pain: Secondary | ICD-10-CM

## 2016-01-27 DIAGNOSIS — E119 Type 2 diabetes mellitus without complications: Secondary | ICD-10-CM

## 2016-01-27 DIAGNOSIS — K861 Other chronic pancreatitis: Secondary | ICD-10-CM

## 2016-01-27 HISTORY — DX: Pure hypercholesterolemia, unspecified: E78.00

## 2016-01-27 HISTORY — DX: Unspecified glaucoma: H40.9

## 2016-01-27 HISTORY — DX: Type 2 diabetes mellitus without complications: E11.9

## 2016-01-27 HISTORY — DX: Other chronic pancreatitis: K86.1

## 2016-01-27 NOTE — Patient Instructions (Signed)
US abdomen 

## 2016-01-27 NOTE — Progress Notes (Signed)
   Subjective:    Patient ID: Tabitha [name], female    DOB: 1955-10-02, 60 y.o.   MRN: [id number]  HPI Referred by Dr. Sherrie Sport for EGD.  She presents today with c/o epigastric pain. She has had the pain for several years. The pain comes and goes. She says this pain is not acid reflux. She also says the pain radiates into her back.  She says she has been to [facility] for hx of pancreatitis. She avoid fatty foods. Appetite is not good. She says she has lost about 6-7 pounds over the past several months.  BMs are normal. Usually has diarrhea.  Hx of chronic pancreatitis and maintained on Creon.  No hx of etoh abuse or drugs. She is a smoker. Smokes 1/2 pack a day.  10/13/2015 Amylase 89, Lipase 38.   Review of Systems Past Medical History:  Diagnosis Date  . Chronic back pain   . Chronic pancreatitis ([place]) 01/27/2016  . Diabetes ([place]) 01/27/2016  . Glaucoma 01/27/2016  . High cholesterol 01/27/2016  . Hypertension     Past Surgical History:  Procedure Laterality Date  . APPENDECTOMY    . BACK SURGERY     x 2  . BREAST REDUCTION SURGERY    . CHOLECYSTECTOMY    . PARTIAL HYSTERECTOMY    . rt foot spur    . rt shoulder rototar cuff      Allergies  Allergen Reactions  . Barium-Containing Compounds Hives and Swelling  . Penicillins Hives and Swelling    No current outpatient prescriptions on file prior to visit.   No current facility-administered medications on file prior to visit.        Objective:   Physical Exam Blood pressure (!) 140/100, pulse 71, resp. rate 18, height 5\' 4"  (1.626 m), weight 193 lb 6.4 oz (87.7 kg).  Alert and oriented. Skin warm and dry. Oral mucosa is moist.   . Sclera anicteric, conjunctivae is pink. Thyroid not enlarged. No cervical lymphadenopathy. Lungs clear. Heart regular rate and rhythm.  Abdomen is soft. Bowel sounds are positive. No hepatomegaly. No abdominal masses felt. No tenderness.  No edema to lower extremities.         Assessment  & Plan:  Epigastric pain. ? Pancreatitis. Am going to get an US abdomen. Further recommendations to follow.

## 2016-01-28 ENCOUNTER — Telehealth (INDEPENDENT_AMBULATORY_CARE_PROVIDER_SITE_OTHER): Payer: Self-pay | Admitting: Internal Medicine

## 2016-01-28 ENCOUNTER — Encounter (INDEPENDENT_AMBULATORY_CARE_PROVIDER_SITE_OTHER): Payer: Self-pay

## 2016-01-28 ENCOUNTER — Ambulatory Visit (HOSPITAL_COMMUNITY)
Admission: RE | Admit: 2016-01-28 | Discharge: 2016-01-28 | Disposition: A | Discharge status: Home / Self Care | Payer: PPO | Source: Ambulatory Visit | Attending: Internal Medicine | Admitting: Internal Medicine

## 2016-01-28 DIAGNOSIS — Z9049 Acquired absence of other specified parts of digestive tract: Secondary | ICD-10-CM | POA: Diagnosis not present

## 2016-01-28 DIAGNOSIS — K828 Other specified diseases of gallbladder: Secondary | ICD-10-CM | POA: Insufficient documentation

## 2016-01-28 DIAGNOSIS — K861 Other chronic pancreatitis: Secondary | ICD-10-CM | POA: Diagnosis not present

## 2016-01-28 DIAGNOSIS — R1013 Epigastric pain: Secondary | ICD-10-CM | POA: Insufficient documentation

## 2016-01-28 DIAGNOSIS — G8929 Other chronic pain: Secondary | ICD-10-CM | POA: Diagnosis not present

## 2016-01-28 DIAGNOSIS — K859 Acute pancreatitis without necrosis or infection, unspecified: Secondary | ICD-10-CM | POA: Diagnosis not present

## 2016-01-28 NOTE — Telephone Encounter (Signed)
MRI ordered

## 2016-01-29 ENCOUNTER — Encounter (INDEPENDENT_AMBULATORY_CARE_PROVIDER_SITE_OTHER): Payer: Self-pay

## 2016-01-30 LAB — COMPREHENSIVE METABOLIC PANEL
ALBUMIN: 4 g/dL (ref 3.6–5.1)
ALK PHOS: 54 U/L (ref 33–130)
ALT: 27 U/L (ref 6–29)
AST: 23 U/L (ref 10–35)
BUN: 10 mg/dL (ref 7–25)
CHLORIDE: 109 mmol/L (ref 98–110)
CO2: 23 mmol/L (ref 20–31)
CREATININE: 0.81 mg/dL (ref 0.50–1.05)
Calcium: 9.6 mg/dL (ref 8.6–10.4)
Glucose, Bld: 123 mg/dL — ABNORMAL HIGH (ref 65–99)
Potassium: 4.6 mmol/L (ref 3.5–5.3)
SODIUM: 142 mmol/L (ref 135–146)
TOTAL PROTEIN: 6.5 g/dL (ref 6.1–8.1)
Total Bilirubin: 0.4 mg/dL (ref 0.2–1.2)

## 2016-02-02 DIAGNOSIS — E784 Other hyperlipidemia: Secondary | ICD-10-CM | POA: Diagnosis not present

## 2016-02-02 DIAGNOSIS — M545 Low back pain: Secondary | ICD-10-CM | POA: Diagnosis not present

## 2016-02-02 DIAGNOSIS — E1165 Type 2 diabetes mellitus with hyperglycemia: Secondary | ICD-10-CM | POA: Diagnosis not present

## 2016-02-07 ENCOUNTER — Ambulatory Visit
Admission: RE | Admit: 2016-02-07 | Discharge: 2016-02-07 | Disposition: A | Discharge status: Home / Self Care | Payer: PPO | Source: Ambulatory Visit | Attending: Internal Medicine | Admitting: Internal Medicine

## 2016-02-07 DIAGNOSIS — K861 Other chronic pancreatitis: Secondary | ICD-10-CM

## 2016-02-07 DIAGNOSIS — R935 Abnormal findings on diagnostic imaging of other abdominal regions, including retroperitoneum: Secondary | ICD-10-CM | POA: Diagnosis not present

## 2016-02-07 DIAGNOSIS — R109 Unspecified abdominal pain: Secondary | ICD-10-CM | POA: Diagnosis not present

## 2016-02-07 MED ORDER — GADOBENATE DIMEGLUMINE 529 MG/ML IV SOLN
20.0000 mL | Freq: Once | INTRAVENOUS | Status: AC | PRN
  Administered 2016-02-07: 20 mL via INTRAVENOUS

## 2016-02-10 ENCOUNTER — Telehealth (INDEPENDENT_AMBULATORY_CARE_PROVIDER_SITE_OTHER): Payer: Self-pay | Admitting: Internal Medicine

## 2016-02-10 NOTE — Telephone Encounter (Signed)
Results given to patient

## 2016-02-10 NOTE — Telephone Encounter (Signed)
Patient called and would like the results of her MRI.  [phone]

## 2016-02-11 ENCOUNTER — Telehealth (INDEPENDENT_AMBULATORY_CARE_PROVIDER_SITE_OTHER): Payer: Self-pay | Admitting: Internal Medicine

## 2016-02-11 ENCOUNTER — Other Ambulatory Visit (INDEPENDENT_AMBULATORY_CARE_PROVIDER_SITE_OTHER): Payer: Self-pay | Admitting: Internal Medicine

## 2016-02-11 DIAGNOSIS — R1010 Upper abdominal pain, unspecified: Secondary | ICD-10-CM

## 2016-02-11 MED ORDER — PANTOPRAZOLE SODIUM 40 MG PO TBEC
40.0000 mg | DELAYED_RELEASE_TABLET | Freq: Every day | ORAL | 3 refills | Status: DC
Start: 2016-02-11 — End: 2016-03-17

## 2016-02-11 NOTE — Telephone Encounter (Signed)
I have already talked with patient

## 2016-02-11 NOTE — Telephone Encounter (Signed)
Rx for Protonix sent to her pharmacy. 

## 2016-02-11 NOTE — Telephone Encounter (Signed)
Ann, EGD 

## 2016-02-11 NOTE — Telephone Encounter (Signed)
Patient called, requesting to speak to Terri  [phone] or [phone]

## 2016-02-12 ENCOUNTER — Encounter (INDEPENDENT_AMBULATORY_CARE_PROVIDER_SITE_OTHER): Payer: Self-pay | Admitting: *Deleted

## 2016-02-12 DIAGNOSIS — R1010 Upper abdominal pain, unspecified: Secondary | ICD-10-CM | POA: Insufficient documentation

## 2016-02-12 NOTE — Telephone Encounter (Signed)
EGD sch'd 03/23/16 at 300 (200), left detailed message for patient, instructions mailed

## 2016-02-19 DIAGNOSIS — H2513 Age-related nuclear cataract, bilateral: Secondary | ICD-10-CM | POA: Diagnosis not present

## 2016-02-19 DIAGNOSIS — H353191 Nonexudative age-related macular degeneration, unspecified eye, early dry stage: Secondary | ICD-10-CM | POA: Diagnosis not present

## 2016-02-19 DIAGNOSIS — H40053 Ocular hypertension, bilateral: Secondary | ICD-10-CM | POA: Diagnosis not present

## 2016-02-19 DIAGNOSIS — E119 Type 2 diabetes mellitus without complications: Secondary | ICD-10-CM | POA: Diagnosis not present

## 2016-02-23 DIAGNOSIS — E1165 Type 2 diabetes mellitus with hyperglycemia: Secondary | ICD-10-CM | POA: Diagnosis not present

## 2016-02-23 DIAGNOSIS — E784 Other hyperlipidemia: Secondary | ICD-10-CM | POA: Diagnosis not present

## 2016-02-23 DIAGNOSIS — M545 Low back pain: Secondary | ICD-10-CM | POA: Diagnosis not present

## 2016-03-16 DIAGNOSIS — E1165 Type 2 diabetes mellitus with hyperglycemia: Secondary | ICD-10-CM | POA: Diagnosis not present

## 2016-03-16 DIAGNOSIS — E784 Other hyperlipidemia: Secondary | ICD-10-CM | POA: Diagnosis not present

## 2016-03-16 DIAGNOSIS — M545 Low back pain: Secondary | ICD-10-CM | POA: Diagnosis not present

## 2016-03-18 DIAGNOSIS — Z6833 Body mass index (BMI) 33.0-33.9, adult: Secondary | ICD-10-CM | POA: Diagnosis not present

## 2016-03-18 DIAGNOSIS — E784 Other hyperlipidemia: Secondary | ICD-10-CM | POA: Diagnosis not present

## 2016-03-18 DIAGNOSIS — E1165 Type 2 diabetes mellitus with hyperglycemia: Secondary | ICD-10-CM | POA: Diagnosis not present

## 2016-03-18 DIAGNOSIS — M545 Low back pain: Secondary | ICD-10-CM | POA: Diagnosis not present

## 2016-03-23 ENCOUNTER — Ambulatory Visit (HOSPITAL_COMMUNITY)
Admission: RE | Admit: 2016-03-23 | Discharge: 2016-03-23 | Disposition: A | Discharge status: Home / Self Care | Payer: PPO | Source: Ambulatory Visit | Attending: Internal Medicine | Admitting: Internal Medicine

## 2016-03-23 ENCOUNTER — Encounter (HOSPITAL_COMMUNITY)
Admission: RE | Disposition: A | Discharge status: Home / Self Care | Payer: Self-pay | Source: Ambulatory Visit | Attending: Internal Medicine

## 2016-03-23 ENCOUNTER — Encounter (HOSPITAL_COMMUNITY): Payer: Self-pay | Admitting: *Deleted

## 2016-03-23 DIAGNOSIS — B9681 Helicobacter pylori [H. pylori] as the cause of diseases classified elsewhere: Secondary | ICD-10-CM | POA: Insufficient documentation

## 2016-03-23 DIAGNOSIS — K2950 Unspecified chronic gastritis without bleeding: Secondary | ICD-10-CM | POA: Insufficient documentation

## 2016-03-23 DIAGNOSIS — R1010 Upper abdominal pain, unspecified: Secondary | ICD-10-CM | POA: Insufficient documentation

## 2016-03-23 DIAGNOSIS — R1013 Epigastric pain: Secondary | ICD-10-CM | POA: Diagnosis not present

## 2016-03-23 DIAGNOSIS — K2960 Other gastritis without bleeding: Secondary | ICD-10-CM | POA: Diagnosis not present

## 2016-03-23 DIAGNOSIS — Z79899 Other long term (current) drug therapy: Secondary | ICD-10-CM | POA: Insufficient documentation

## 2016-03-23 DIAGNOSIS — F17210 Nicotine dependence, cigarettes, uncomplicated: Secondary | ICD-10-CM | POA: Insufficient documentation

## 2016-03-23 DIAGNOSIS — K3189 Other diseases of stomach and duodenum: Secondary | ICD-10-CM | POA: Diagnosis not present

## 2016-03-23 DIAGNOSIS — R110 Nausea: Secondary | ICD-10-CM | POA: Insufficient documentation

## 2016-03-23 DIAGNOSIS — E119 Type 2 diabetes mellitus without complications: Secondary | ICD-10-CM | POA: Insufficient documentation

## 2016-03-23 DIAGNOSIS — K269 Duodenal ulcer, unspecified as acute or chronic, without hemorrhage or perforation: Secondary | ICD-10-CM | POA: Diagnosis not present

## 2016-03-23 DIAGNOSIS — I10 Essential (primary) hypertension: Secondary | ICD-10-CM | POA: Diagnosis not present

## 2016-03-23 DIAGNOSIS — K31819 Angiodysplasia of stomach and duodenum without bleeding: Secondary | ICD-10-CM | POA: Diagnosis not present

## 2016-03-23 DIAGNOSIS — Z7983 Long term (current) use of bisphosphonates: Secondary | ICD-10-CM | POA: Diagnosis not present

## 2016-03-23 DIAGNOSIS — Z79891 Long term (current) use of opiate analgesic: Secondary | ICD-10-CM | POA: Diagnosis not present

## 2016-03-23 DIAGNOSIS — E7800 Pure hypercholesterolemia, unspecified: Secondary | ICD-10-CM | POA: Insufficient documentation

## 2016-03-23 DIAGNOSIS — Z7984 Long term (current) use of oral hypoglycemic drugs: Secondary | ICD-10-CM | POA: Diagnosis not present

## 2016-03-23 HISTORY — PX: ESOPHAGOGASTRODUODENOSCOPY: SHX5428

## 2016-03-23 LAB — GLUCOSE, CAPILLARY: Glucose-Capillary: 143 mg/dL — ABNORMAL HIGH (ref 65–99)

## 2016-03-23 SURGERY — EGD (ESOPHAGOGASTRODUODENOSCOPY)
Anesthesia: Moderate Sedation

## 2016-03-23 MED ORDER — SODIUM CHLORIDE 0.9 % IV SOLN
INTRAVENOUS | Status: DC
  Administered 2016-03-23: 12:00:00 via INTRAVENOUS

## 2016-03-23 MED ORDER — BUTAMBEN-TETRACAINE-BENZOCAINE 2-2-14 % EX AERO
INHALATION_SPRAY | CUTANEOUS | Status: DC | PRN
  Administered 2016-03-23: 2 via TOPICAL

## 2016-03-23 MED ORDER — MIDAZOLAM HCL 5 MG/5ML IJ SOLN
INTRAMUSCULAR | Status: DC | PRN
  Administered 2016-03-23: 3 mg via INTRAVENOUS
  Administered 2016-03-23 (×2): 2 mg via INTRAVENOUS

## 2016-03-23 MED ORDER — STERILE WATER FOR IRRIGATION IR SOLN
Status: DC | PRN
  Administered 2016-03-23: 13:00:00

## 2016-03-23 MED ORDER — DICYCLOMINE HCL 10 MG PO CAPS: 10.0000 mg | ORAL_CAPSULE | Freq: Three times a day (TID) | ORAL | 1 refills | Status: DC

## 2016-03-23 MED ORDER — MIDAZOLAM HCL 5 MG/5ML IJ SOLN
INTRAMUSCULAR | Status: DC
Start: 2016-03-23 — End: 2016-03-23
  Filled 2016-03-23: qty 10

## 2016-03-23 MED ORDER — MEPERIDINE HCL 50 MG/ML IJ SOLN
INTRAMUSCULAR | Status: DC | PRN
  Administered 2016-03-23 (×2): 25 mg via INTRAVENOUS

## 2016-03-23 MED ORDER — MEPERIDINE HCL 50 MG/ML IJ SOLN
INTRAMUSCULAR | Status: AC
  Filled 2016-03-23: qty 1

## 2016-03-23 NOTE — Op Note (Signed)
[facility] Patient Name: Tabitha [name] Procedure Date: 03/23/2016 11:55 AM MRN: [id number] Date of Birth: [birth year] Attending MD: Hildred Laser , MD CSN: [id number] Age: 61 Admit Type: Outpatient Procedure:                Upper GI endoscopy Indications:              Epigastric abdominal pain. Providers:                Hildred Laser, MD, Jeanann Lewandowsky. Sharon Seller, RN, Isabella Stalling, Technician Referring MD:             Stoney Bang MD, MD Medicines:                Cetacaine spray, Meperidine 50 mg IV, Midazolam 7                            mg IV Complications:            No immediate complications. Estimated Blood Loss:     Estimated blood loss was minimal. Procedure:                Pre-Anesthesia Assessment:                           - Prior to the procedure, a History and Physical                            was performed, and patient medications and                            allergies were reviewed. The patient's tolerance of                            previous anesthesia was also reviewed. The risks                            and benefits of the procedure and the sedation                            options and risks were discussed with the patient.                            All questions were answered, and informed consent                            was obtained. Prior Anticoagulants: The patient has                            taken no previous anticoagulant or antiplatelet                            agents. ASA Grade Assessment: II - A patient with  mild systemic disease. After reviewing the risks                            and benefits, the patient was deemed in                            satisfactory condition to undergo the procedure.                           After obtaining informed consent, the endoscope was                            passed under direct vision. Throughout the                            procedure, the  patient's blood pressure, pulse, and                            oxygen saturations were monitored continuously. The                            [id number]) scope was introduced through the                            mouth, and advanced to the second part of duodenum.                            The upper GI endoscopy was accomplished without                            difficulty. The patient tolerated the procedure                            well. Scope In: 12:36:20 PM Scope Out: 12:43:10 PM Total Procedure Duration: 0 hours 6 minutes 50 seconds  Findings:      The examined esophagus was normal.      The Z-line was regular.      Mild gastric antral vascular ectasia was present in the gastric fundus       and in the gastric body.      Patchy mildly erythematous mucosa without bleeding was found in the       gastric antrum. Biopsies were taken with a cold forceps for histology.      The exam of the stomach was otherwise normal.      A few erosions without bleeding were found in the duodenal bulb.      The second portion of the duodenum was normal. Impression:               - Normal esophagus.                           - Z-line regular.                           - Gastric antral vascular ectasia.                           -  Erythematous mucosa in the antrum. Biopsied.                           - Duodenal erosions without bleeding.                           - Normal second portion of the duodenum. Moderate Sedation:      Moderate (conscious) sedation was administered by the endoscopy nurse       and supervised by the endoscopist. The following parameters were       monitored: oxygen saturation, heart rate, blood pressure, CO2       capnography and response to care. Total physician intraservice time was       13 minutes. Recommendation:           - Patient has a contact number available for                            emergencies. The signs and symptoms of potential                             delayed complications were discussed with the                            patient. Return to normal activities tomorrow.                            Written discharge instructions were provided to the                            patient.                           - Resume previous diet today.                           - Continue present medications.                           - Use Bentyl (dicyclomine) 10 mg PO TID 30 min AC. Procedure Code(s):        --- Professional ---                           [phone], Esophagogastroduodenoscopy, flexible,                            transoral; with biopsy, single or multiple                           99152, Moderate sedation services provided by the                            same physician or other qualified health care                            professional performing the diagnostic or  therapeutic service that the sedation supports,                            requiring the presence of an independent trained                            observer to assist in the monitoring of the                            patient's level of consciousness and physiological                            status; initial 15 minutes of intraservice time,                            patient age 13 years or older Diagnosis Code(s):        --- Professional ---                           K31.819, Angiodysplasia of stomach and duodenum                            without bleeding                           K31.89, Other diseases of stomach and duodenum                           K26.9, Duodenal ulcer, unspecified as acute or                            chronic, without hemorrhage or perforation                           R10.13, Epigastric pain CPT copyright 2016 American [employer]. All rights reserved. The codes documented in this report are preliminary and upon coder review may  be revised to meet current compliance requirements. Hildred Laser,  MD Hildred Laser, MD 03/23/2016 12:57:12 PM This report has been signed electronically. Number of Addenda: 0

## 2016-03-23 NOTE — H&P (Signed)
Tabitha [name] is an 61 y.o. female.   Chief Complaint: Patient is here for EGD. HPI: Patient is 61 year old Caucasian female was history of pancreatitis dating back to 12 years. She was evaluated at [facility] and felt to have chronic pancreatitis. She is on pancreatic enzyme supplement. She now has chronic epigastric pain with nausea. At times pain gets worse with meals. She has lost 10 pounds last year when it was all infiltrated weight loss. She denies melena or rectal bleeding. She is on chronic pain indication. She does not drink alcohol. She does smoke cigarettes She underwent MRCP recently and no abnormality noted to her pancreas. CBD was mildly dilated without filling defects. Mild dilation of CBD felt to be post cholecystectomy.  Past Medical History:  Diagnosis Date  . Chronic back pain   . Chronic pancreatitis ([place]) 01/27/2016  . Diabetes ([place]) 01/27/2016  . Glaucoma 01/27/2016  . High cholesterol 01/27/2016  . Hypertension        History of colonic polyps. She had colonoscopy in December 2014 with removal of 4 polyps in 3 polyps are tubular adenomas(Dr. Britta Mccreedy at [facility]).  Past Surgical History:  Procedure Laterality Date  . APPENDECTOMY    . BACK SURGERY     x 2  . BREAST REDUCTION SURGERY    . CHOLECYSTECTOMY    . PARTIAL HYSTERECTOMY    . rt foot spur    . rt shoulder rototar cuff      Family History  Problem Relation Age of Onset  . Cancer Father   . Diabetes Sister   . Breast cancer Sister   . Esophageal varices Brother   . Diabetes Sister    Social History:  reports that she has been smoking Cigarettes.  She has been smoking about 0.50 packs per day. She has never used smokeless tobacco. She reports that she does not drink alcohol or use drugs.  Allergies:  Allergies  Allergen Reactions  . Ampicillin Other (See Comments)    Hives, swelling  . Barium-Containing Compounds Hives and Swelling  . Morphine And Related Hives    Hives, rash , bad itch  . Penicillins  Hives and Swelling    Medications Prior to Admission  Medication Sig Dispense Refill  . alendronate (FOSAMAX) 70 MG tablet Take 70 mg by mouth once a week. Take with a full glass of water on an empty stomach.    . ALPRAZolam (XANAX) 0.5 MG tablet Take 0.5 mg by mouth at bedtime as needed for anxiety.    Marland Kitchen amLODipine (NORVASC) 5 MG tablet Take 5 mg by mouth daily.    Marland Kitchen atorvastatin (LIPITOR) 20 MG tablet Take 20 mg by mouth daily.    . cholecalciferol (VITAMIN D) 1000 units tablet Take 1 tablet by mouth daily.     Marland Kitchen latanoprost (XALATAN) 0.005 % ophthalmic solution Place 1 drop into both eyes at bedtime.    Marland Kitchen lisinopril (PRINIVIL,ZESTRIL) 40 MG tablet Take 40 mg by mouth daily.    . metFORMIN (GLUCOPHAGE) 500 MG tablet Take 1,000 mg by mouth 2 (two) times daily with a meal. Patient takes 2 po in the morning , and 2 po in the evening.     Marland Kitchen oxyCODONE-acetaminophen (PERCOCET) 10-325 MG tablet Take 1 tablet by mouth 3 (three) times daily.    . Pancrelipase, Lip-Prot-Amyl, (CREON) 24000-76000 units CPEP Take 2 capsules by mouth 3 (three) times daily before meals.     . triamterene-hydrochlorothiazide (MAXZIDE) 75-50 MG tablet Take 1 tablet by mouth  daily.      Results for orders placed or performed during the hospital encounter of 03/23/16 (from the past 48 hour(s))  Glucose, capillary     Status: Abnormal   Collection Time: 03/23/16 12:03 PM  Result Value Ref Range   Glucose-Capillary 143 (H) 65 - 99 mg/dL   No results found.  ROS  Blood pressure (!) 144/88, pulse 82, temperature 98.5 F (36.9 C), temperature source Oral, resp. rate 18, SpO2 97 %. Physical Exam  Constitutional: She appears well-developed and well-nourished.  HENT:  Mouth/Throat: Oropharynx is clear and moist.  Eyes: Conjunctivae are normal. No scleral icterus.  Neck: No thyromegaly present.  Cardiovascular: Normal rate, regular rhythm and normal heart sounds.   No murmur heard. Respiratory: Effort normal and breath  sounds normal.  GI:  Abdomen is full but soft with midepigastric tenderness. No organomegaly or masses  Musculoskeletal: She exhibits no edema.  Lymphadenopathy:    She has no cervical adenopathy.  Neurological: She is alert.  Skin: Skin is warm and dry.     Assessment/Plan Chronic epigastric pain and nausea. Diagnostic EGD.  Hildred Laser, MD 03/23/2016, 12:23 PM

## 2016-03-23 NOTE — Discharge Instructions (Addendum)
Resume usual medications and diet. Dicyclomine 10 mg by mouth 30 minutes before each meal. No driving for 24 hours. Physician will call with biopsy results and further recommendations.   Upper Endoscopy, Care After Refer to this sheet in the next few weeks. These instructions provide you with information about caring for yourself after your procedure. Your health care provider may also give you more specific instructions. Your treatment has been planned according to current medical practices, but problems sometimes occur. Call your health care provider if you have any problems or questions after your procedure. What can I expect after the procedure? After the procedure, it is common to have:  A sore throat.  Bloating.  Nausea. Follow these instructions at home:  Follow instructions from your health care provider about what to eat or drink after your procedure.  Return to your normal activities as told by your health care provider. Ask your health care provider what activities are safe for you.  Take over-the-counter and prescription medicines only as told by your health care provider.  Do not drive for 24 hours if you received a sedative.  Keep all follow-up visits as told by your health care provider. This is important. Contact a health care provider if:  You have a sore throat that lasts longer than one day.  You have trouble swallowing. Get help right away if:  You have a fever.  You vomit blood or your vomit looks like coffee grounds.  You have bloody, black, or tarry stools.  You have a severe sore throat or you cannot swallow.  You have difficulty breathing.  You have severe pain in your chest or belly. This information is not intended to replace advice given to you by your health care provider. Make sure you discuss any questions you have with your health care provider. Document Released: 07/26/2011 Document Revised: 07/02/2015 Document Reviewed:  11/06/2014 Elsevier Interactive Patient Education  2017 [employer].

## 2016-03-26 ENCOUNTER — Other Ambulatory Visit (INDEPENDENT_AMBULATORY_CARE_PROVIDER_SITE_OTHER): Payer: Self-pay | Admitting: Internal Medicine

## 2016-03-26 MED ORDER — OMEPRAZOLE 20 MG PO CPDR: 20.0000 mg | DELAYED_RELEASE_CAPSULE | Freq: Two times a day (BID) | ORAL | 0 refills | Status: DC

## 2016-03-26 MED ORDER — BIS SUBCIT-METRONID-TETRACYC 140-125-125 MG PO CAPS: 3.0000 | ORAL_CAPSULE | Freq: Three times a day (TID) | ORAL | 0 refills | Status: DC

## 2016-03-28 ENCOUNTER — Telehealth (INDEPENDENT_AMBULATORY_CARE_PROVIDER_SITE_OTHER): Payer: Self-pay | Admitting: Internal Medicine

## 2016-03-28 DIAGNOSIS — M7989 Other specified soft tissue disorders: Secondary | ICD-10-CM | POA: Diagnosis not present

## 2016-03-28 NOTE — Telephone Encounter (Signed)
Currently no samples. Will need to discuss with Dr.Rehman, patient is allergic to Ampicillin and Penicillin.

## 2016-03-28 NOTE — Telephone Encounter (Signed)
To be addressed by Dr.Rehman.

## 2016-03-28 NOTE — Telephone Encounter (Signed)
Patient called, stated that Dr. Laural Golden called her on Saturday and told her he was going to see if we had samples for her because the medication was too expensive at the pharmacy.  If we do not have any samples, she said he would call something different in.  She does not know what the medication is called.  [place]

## 2016-03-30 ENCOUNTER — Encounter (INDEPENDENT_AMBULATORY_CARE_PROVIDER_SITE_OTHER): Payer: Self-pay | Admitting: Internal Medicine

## 2016-03-30 ENCOUNTER — Encounter (HOSPITAL_COMMUNITY): Payer: Self-pay | Admitting: Internal Medicine

## 2016-03-30 NOTE — Progress Notes (Signed)
Patient was given an appointment for 05/17/16 at 1:30pm with Dr. Laural Golden.  A letter was mailed to the patient.

## 2016-04-07 NOTE — Telephone Encounter (Signed)
The following was called to [facility]- Bismuth - take 1 1/2 gram by mouth daily , Metronidazole 500 mg take 1 by mouth 3 times daily for 10 days, Tetracycline 500 mg - Take 1 by mouth 3 times a day for 10 days. Patient has Omeprazole 20 mg that she will take twice a day while on this treatment.

## 2016-04-12 DIAGNOSIS — E1165 Type 2 diabetes mellitus with hyperglycemia: Secondary | ICD-10-CM | POA: Diagnosis not present

## 2016-04-12 DIAGNOSIS — M545 Low back pain: Secondary | ICD-10-CM | POA: Diagnosis not present

## 2016-04-12 DIAGNOSIS — E784 Other hyperlipidemia: Secondary | ICD-10-CM | POA: Diagnosis not present

## 2016-05-17 ENCOUNTER — Ambulatory Visit (INDEPENDENT_AMBULATORY_CARE_PROVIDER_SITE_OTHER): Payer: PPO | Admitting: Internal Medicine

## 2016-05-19 ENCOUNTER — Ambulatory Visit (INDEPENDENT_AMBULATORY_CARE_PROVIDER_SITE_OTHER): Payer: PPO | Admitting: Internal Medicine

## 2016-05-19 DIAGNOSIS — Z0000 Encounter for general adult medical examination without abnormal findings: Secondary | ICD-10-CM | POA: Diagnosis not present

## 2016-05-19 DIAGNOSIS — Z6833 Body mass index (BMI) 33.0-33.9, adult: Secondary | ICD-10-CM | POA: Diagnosis not present

## 2016-05-19 DIAGNOSIS — Z1389 Encounter for screening for other disorder: Secondary | ICD-10-CM | POA: Diagnosis not present

## 2016-05-19 DIAGNOSIS — M545 Low back pain: Secondary | ICD-10-CM | POA: Diagnosis not present

## 2016-05-19 DIAGNOSIS — E784 Other hyperlipidemia: Secondary | ICD-10-CM | POA: Diagnosis not present

## 2016-05-19 DIAGNOSIS — E1165 Type 2 diabetes mellitus with hyperglycemia: Secondary | ICD-10-CM | POA: Diagnosis not present

## 2016-05-27 DIAGNOSIS — Z1231 Encounter for screening mammogram for malignant neoplasm of breast: Secondary | ICD-10-CM | POA: Diagnosis not present

## 2016-06-01 ENCOUNTER — Other Ambulatory Visit: Payer: Self-pay | Admitting: Internal Medicine

## 2016-06-01 DIAGNOSIS — R928 Other abnormal and inconclusive findings on diagnostic imaging of breast: Secondary | ICD-10-CM | POA: Diagnosis not present

## 2016-06-01 DIAGNOSIS — E784 Other hyperlipidemia: Secondary | ICD-10-CM | POA: Diagnosis not present

## 2016-06-01 DIAGNOSIS — E1142 Type 2 diabetes mellitus with diabetic polyneuropathy: Secondary | ICD-10-CM | POA: Diagnosis not present

## 2016-06-01 DIAGNOSIS — I10 Essential (primary) hypertension: Secondary | ICD-10-CM | POA: Diagnosis not present

## 2016-06-01 DIAGNOSIS — N6489 Other specified disorders of breast: Secondary | ICD-10-CM | POA: Diagnosis not present

## 2016-06-01 DIAGNOSIS — N6310 Unspecified lump in the right breast, unspecified quadrant: Secondary | ICD-10-CM | POA: Diagnosis not present

## 2016-06-07 DIAGNOSIS — C801 Malignant (primary) neoplasm, unspecified: Secondary | ICD-10-CM

## 2016-06-07 HISTORY — DX: Malignant (primary) neoplasm, unspecified: C80.1

## 2016-06-07 HISTORY — PX: BREAST LUMPECTOMY: SHX2

## 2016-06-08 ENCOUNTER — Ambulatory Visit
Admission: RE | Admit: 2016-06-08 | Discharge: 2016-06-08 | Disposition: A | Discharge status: Home / Self Care | Payer: PPO | Source: Ambulatory Visit | Attending: Internal Medicine | Admitting: Internal Medicine

## 2016-06-08 ENCOUNTER — Other Ambulatory Visit: Payer: Self-pay | Admitting: Diagnostic Radiology

## 2016-06-08 DIAGNOSIS — N6310 Unspecified lump in the right breast, unspecified quadrant: Secondary | ICD-10-CM

## 2016-06-08 DIAGNOSIS — N6313 Unspecified lump in the right breast, lower outer quadrant: Secondary | ICD-10-CM | POA: Diagnosis not present

## 2016-06-08 DIAGNOSIS — C50511 Malignant neoplasm of lower-outer quadrant of right female breast: Secondary | ICD-10-CM | POA: Diagnosis not present

## 2016-06-08 HISTORY — PX: BREAST BIOPSY: SHX20

## 2016-06-09 ENCOUNTER — Telehealth: Payer: Self-pay | Admitting: *Deleted

## 2016-06-09 NOTE — Telephone Encounter (Signed)
Left vm for return call to discuss [place] for 06/15/16. Contact information provided.

## 2016-06-09 NOTE — Telephone Encounter (Signed)
Confirmed [facility] for 06/15/16 at 0815 .  Instructions and contact information given.

## 2016-06-14 ENCOUNTER — Encounter: Payer: Self-pay | Admitting: *Deleted

## 2016-06-14 ENCOUNTER — Other Ambulatory Visit: Payer: Self-pay | Admitting: *Deleted

## 2016-06-14 DIAGNOSIS — C50511 Malignant neoplasm of lower-outer quadrant of right female breast: Secondary | ICD-10-CM

## 2016-06-14 DIAGNOSIS — Z803 Family history of malignant neoplasm of breast: Secondary | ICD-10-CM | POA: Diagnosis not present

## 2016-06-14 DIAGNOSIS — Z8042 Family history of malignant neoplasm of prostate: Secondary | ICD-10-CM | POA: Diagnosis not present

## 2016-06-14 DIAGNOSIS — Z170 Estrogen receptor positive status [ER+]: Secondary | ICD-10-CM

## 2016-06-15 ENCOUNTER — Encounter: Payer: Self-pay | Admitting: Hematology and Oncology

## 2016-06-15 ENCOUNTER — Ambulatory Visit (HOSPITAL_BASED_OUTPATIENT_CLINIC_OR_DEPARTMENT_OTHER): Payer: PPO | Admitting: Genetics

## 2016-06-15 ENCOUNTER — Ambulatory Visit: Payer: Self-pay | Admitting: General Surgery

## 2016-06-15 ENCOUNTER — Ambulatory Visit (HOSPITAL_BASED_OUTPATIENT_CLINIC_OR_DEPARTMENT_OTHER): Payer: PPO | Admitting: Hematology and Oncology

## 2016-06-15 ENCOUNTER — Ambulatory Visit: Payer: PPO | Attending: General Surgery | Admitting: Physical Therapy

## 2016-06-15 ENCOUNTER — Other Ambulatory Visit: Payer: PPO

## 2016-06-15 ENCOUNTER — Other Ambulatory Visit: Payer: Self-pay | Admitting: *Deleted

## 2016-06-15 ENCOUNTER — Other Ambulatory Visit (HOSPITAL_BASED_OUTPATIENT_CLINIC_OR_DEPARTMENT_OTHER): Payer: PPO

## 2016-06-15 ENCOUNTER — Ambulatory Visit (INDEPENDENT_AMBULATORY_CARE_PROVIDER_SITE_OTHER): Payer: PPO | Admitting: Internal Medicine

## 2016-06-15 ENCOUNTER — Encounter: Payer: Self-pay | Admitting: Physical Therapy

## 2016-06-15 ENCOUNTER — Ambulatory Visit
Admission: RE | Admit: 2016-06-15 | Discharge: 2016-06-15 | Disposition: A | Discharge status: Home / Self Care | Payer: PPO | Source: Ambulatory Visit | Attending: Radiation Oncology | Admitting: Radiation Oncology

## 2016-06-15 DIAGNOSIS — Z170 Estrogen receptor positive status [ER+]: Secondary | ICD-10-CM

## 2016-06-15 DIAGNOSIS — M25611 Stiffness of right shoulder, not elsewhere classified: Secondary | ICD-10-CM | POA: Diagnosis not present

## 2016-06-15 DIAGNOSIS — C50511 Malignant neoplasm of lower-outer quadrant of right female breast: Secondary | ICD-10-CM

## 2016-06-15 DIAGNOSIS — C50911 Malignant neoplasm of unspecified site of right female breast: Secondary | ICD-10-CM

## 2016-06-15 DIAGNOSIS — F17210 Nicotine dependence, cigarettes, uncomplicated: Secondary | ICD-10-CM | POA: Diagnosis not present

## 2016-06-15 DIAGNOSIS — Z803 Family history of malignant neoplasm of breast: Secondary | ICD-10-CM

## 2016-06-15 DIAGNOSIS — Z716 Tobacco abuse counseling: Secondary | ICD-10-CM | POA: Diagnosis not present

## 2016-06-15 DIAGNOSIS — Z808 Family history of malignant neoplasm of other organs or systems: Secondary | ICD-10-CM

## 2016-06-15 DIAGNOSIS — R293 Abnormal posture: Secondary | ICD-10-CM | POA: Insufficient documentation

## 2016-06-15 DIAGNOSIS — Z7183 Encounter for nonprocreative genetic counseling: Secondary | ICD-10-CM

## 2016-06-15 LAB — CBC WITH DIFFERENTIAL/PLATELET
BASO%: 1.2 % (ref 0.0–2.0)
BASOS ABS: 0.1 10*3/uL (ref 0.0–0.1)
EOS%: 2.5 % (ref 0.0–7.0)
Eosinophils Absolute: 0.2 10*3/uL (ref 0.0–0.5)
HCT: 44.5 % (ref 34.8–46.6)
HEMOGLOBIN: 15.2 g/dL (ref 11.6–15.9)
LYMPH%: 28.2 % (ref 14.0–49.7)
MCH: 29.9 pg (ref 25.1–34.0)
MCHC: 34.1 g/dL (ref 31.5–36.0)
MCV: 87.4 fL (ref 79.5–101.0)
MONO#: 0.5 10*3/uL (ref 0.1–0.9)
MONO%: 4.9 % (ref 0.0–14.0)
NEUT#: 5.9 10*3/uL (ref 1.5–6.5)
NEUT%: 63.2 % (ref 38.4–76.8)
Platelets: 259 10*3/uL (ref 145–400)
RBC: 5.08 10*6/uL (ref 3.70–5.45)
RDW: 13.2 % (ref 11.2–14.5)
WBC: 9.3 10*3/uL (ref 3.9–10.3)
lymph#: 2.6 10*3/uL (ref 0.9–3.3)

## 2016-06-15 LAB — COMPREHENSIVE METABOLIC PANEL
ALT: 40 U/L (ref 0–55)
AST: 29 U/L (ref 5–34)
Albumin: 4 g/dL (ref 3.5–5.0)
Alkaline Phosphatase: 74 U/L (ref 40–150)
Anion Gap: 12 mEq/L — ABNORMAL HIGH (ref 3–11)
BUN: 8.3 mg/dL (ref 7.0–26.0)
CHLORIDE: 104 meq/L (ref 98–109)
CO2: 25 meq/L (ref 22–29)
Calcium: 9.6 mg/dL (ref 8.4–10.4)
Creatinine: 0.8 mg/dL (ref 0.6–1.1)
EGFR: 79 mL/min/{1.73_m2} — AB (ref >90)
GLUCOSE: 151 mg/dL — AB (ref 70–140)
POTASSIUM: 4.2 meq/L (ref 3.5–5.1)
SODIUM: 141 meq/L (ref 136–145)
Total Bilirubin: 0.71 mg/dL (ref 0.20–1.20)
Total Protein: 7.3 g/dL (ref 6.4–8.3)

## 2016-06-15 MED ORDER — ANASTROZOLE 1 MG PO TABS: 1.0000 mg | ORAL_TABLET | Freq: Every day | ORAL | 6 refills | Status: DC

## 2016-06-15 NOTE — Progress Notes (Signed)
Radiation Oncology         [phone] ________________________________  Initial outpatient Consultation  Name: Tabitha [name] MRN: [id number]  Date: 06/15/2016  DOB: [birth year]  YE:[id number], Samul Dada, MD  Excell Seltzer, MD   REFERRING PHYSICIAN: Excell Seltzer, MD  DIAGNOSIS:    ICD-9-CM ICD-10-CM   1. Malignant neoplasm of lower-outer quadrant of right breast of female, estrogen receptor positive ([place]) 174.5 C50.511    V86.0 Z17.0   Cancer Staging Malignant neoplasm of lower-outer quadrant of right breast of female, estrogen receptor positive ([place]) Staging form: Breast, AJCC 8th Edition - Clinical stage from 06/15/2016: Stage IA (cT1b, cN0, cM0, G2, ER: Positive, PR: Positive, HER2: Negative) - Unsigned Staging comments: Staged at breast conference on 5.9.18  Stage T1b M0 N0 Right Breast LOQ Invasive Ductal Carcinoma, ER+ / PR+ / Her2 neg, Grade II  CHIEF COMPLAINT: Here to discuss management of right breast cancer  HISTORY OF PRESENT ILLNESS::Tabitha [name] is a 61 y.o. female who presented with screening mammogram in [place] which showed asymmetry in the right breast. The lesion measured 7 mm with no ultrasound correlate and the axilla was negative. Biopsy showed invasive ductal carcinoma with characteristics as described above in the diagnosis. Genetics referral was recommended at tumor board  She is accompanied by several family members today. She lives in [place]. She currently smokes 1/2 ppd. She is motivated to quit smoking. Today we set a quit date of June 30th, 2018.   On review of systems, the patient reports stomach and back pain, described as stabbing and throbbing. She wears glasses. She has dentures. She reports feet swelling. She uses two pillows to sleep. She reports nausea/vomiting and abdominal pain. She has arthritis. She reports anxiety. She as diabetes. A complete review of systems is obtained and is otherwise negative. Chronic RLE edema x years.   PREVIOUS  RADIATION THERAPY: No  PAST MEDICAL HISTORY:  has a past medical history of Chronic back pain; Chronic pancreatitis ([place]) (01/27/2016); Diabetes ([place]) (01/27/2016); Glaucoma (01/27/2016); High cholesterol (01/27/2016); and Hypertension.    PAST SURGICAL HISTORY: Past Surgical History:  Procedure Laterality Date  . APPENDECTOMY    . BACK SURGERY     x 2  . BREAST REDUCTION SURGERY    . CHOLECYSTECTOMY    . ESOPHAGOGASTRODUODENOSCOPY N/A 03/23/2016   Procedure: ESOPHAGOGASTRODUODENOSCOPY (EGD);  Surgeon: Rogene Houston, MD;  Location: [facility];  Service: Endoscopy;  Laterality: N/A;  3:00  . PARTIAL HYSTERECTOMY    . rt foot spur    . rt shoulder rototar cuff      FAMILY HISTORY: family history includes Breast cancer in her sister; Cancer in her father; Diabetes in her sister and sister; Esophageal varices in her brother.  SOCIAL HISTORY:  reports that she has been smoking Cigarettes.  She has been smoking about 0.50 packs per day. She has never used smokeless tobacco. She reports that she does not drink alcohol or use drugs.  ALLERGIES: Ampicillin; Barium-containing compounds; Morphine and related; and Penicillins  MEDICATIONS:  Current Outpatient Prescriptions  Medication Sig Dispense Refill  . alendronate (FOSAMAX) 70 MG tablet Take 70 mg by mouth once a week. Take with a full glass of water on an empty stomach.    . ALPRAZolam (XANAX) 0.5 MG tablet Take 0.5 mg by mouth at bedtime as needed for anxiety.    Marland Kitchen amLODipine (NORVASC) 5 MG tablet Take 5 mg by mouth daily.    Marland Kitchen atorvastatin (LIPITOR) 20 MG tablet Take  20 mg by mouth daily.    Marland Kitchen bismuth-metronidazole-tetracycline (PYLERA) 140-125-125 MG capsule Take 3 capsules by mouth 4 (four) times daily -  before meals and at bedtime. (Patient not taking: Reported on 06/15/2016) 120 capsule 0  . cholecalciferol (VITAMIN D) 1000 units tablet Take 1 tablet by mouth daily.     Marland Kitchen dicyclomine (BENTYL) 10 MG capsule Take 1 capsule (10 mg  total) by mouth 3 (three) times daily before meals. (Patient not taking: Reported on 06/15/2016) 90 capsule 1  . latanoprost (XALATAN) 0.005 % ophthalmic solution Place 1 drop into both eyes at bedtime.    Marland Kitchen lisinopril (PRINIVIL,ZESTRIL) 40 MG tablet Take 40 mg by mouth daily.    . metFORMIN (GLUCOPHAGE) 500 MG tablet Take 1,000 mg by mouth 2 (two) times daily with a meal. Patient takes 2 po in the morning , and 2 po in the evening.     Marland Kitchen omeprazole (PRILOSEC) 20 MG capsule Take 1 capsule (20 mg total) by mouth 2 (two) times daily before a meal. (Patient not taking: Reported on 06/15/2016) 30 capsule 0  . oxyCODONE-acetaminophen (PERCOCET) 10-325 MG tablet Take 1 tablet by mouth 3 (three) times daily.    . Pancrelipase, Lip-Prot-Amyl, (CREON) 24000-76000 units CPEP Take 2 capsules by mouth 3 (three) times daily before meals.     . triamterene-hydrochlorothiazide (MAXZIDE) 75-50 MG tablet Take 1 tablet by mouth daily.     No current facility-administered medications for this encounter.    Gynecologic History  Age at first menstrual period? 15  Are you still having periods? No Approximate date of last period? N/A  If you are still having periods: Are your periods regular? N/A  If you no longer have periods: Have you used hormone replacement? No  If YES, for how long? N/A When did you stop? N/A Obstetric History:  How many children have you carried to term? 1 Your age at first live birth? 35  Pregnant now or trying to get pregnant? No  Have you used birth control pills or hormone shots for contraception? No  If so, for how long (or approximate dates)? N/A  Would you be interested in learning more about the options to preserve fertility? No Health Maintenance:  Have you ever had a colonoscopy? Yes If yes, date? Within 5 years  Have you ever had a bone density? Yes If yes, date? ?  Date of your last PAP smear? 2017 Date of your FIRST mammogram? ?  REVIEW OF SYSTEMS: A 10+ POINT REVIEW OF SYSTEMS  WAS OBTAINED including neurology, dermatology, psychiatry, cardiac, respiratory, lymph, extremities, GI, GU, Musculoskeletal, constitutional, breasts, reproductive, HEENT.  All pertinent positives are noted in the HPI.  All others are negative.   PHYSICAL EXAM:  Vitals with BMI 06/15/2016  Height '5\' 4"'   Weight 193 lbs 10 oz  BMI 29.5  Systolic 188  Diastolic 75  Pulse 72  Respirations 18   General: Alert and oriented, in no acute distress HEENT: Head is normocephalic. Extraocular movements are intact. Oropharynx is clear. Horizontal scar over her lower neck which is related to cervical neck surgery.  Neck: Neck is supple, no palpable cervical or supraclavicular lymphadenopathy. Heart: Regular in rate and rhythm with no murmurs, rubs, or gallops. Chest: Clear to auscultation bilaterally, with no rhonchi, wheezes, or rales. Left upper chest port-a-cath scar for prior chronic pancreatitis. Abdomen: Soft, mildly tender, nondistended, with no rigidity or guarding. Extremities: No cyanosis. Edema, slightly pitting, in the RLE. Of note, she states has had  a doppler to work up her RLE which is chronic. No upper extremity edema. Lymphatics: see Neck Exam Skin: No concerning lesions. Musculoskeletal: symmetric strength and muscle tone throughout. Neurologic: Cranial nerves II through XII are grossly intact. No obvious focalities. Speech is fluent. Coordination is intact. Psychiatric: Judgment and insight are intact. Affect is appropriate. Breasts: Status post breast reduction bilaterally. Right breast in the LOQ she has bruising related to her biopsy and firmness through the majority of the right lower breast. It is difficult to feel the actual tumor. No palpable adenopathy in the right axilla. Left breast tissue is denser in the LOQ compared to the UOQ but no palpable masses or axillary adenopathy. No other palpable masses appreciated in the breasts or axillae.  ECOG = 2  0 - Asymptomatic (Fully  active, able to carry on all predisease activities without restriction)  1 - Symptomatic but completely ambulatory (Restricted in physically strenuous activity but ambulatory and able to carry out work of a light or sedentary nature. For example, light housework, office work)  2 - Symptomatic, <50% in bed during the day (Ambulatory and capable of all self care but unable to carry out any work activities. Up and about more than 50% of waking hours)  3 - Symptomatic, >50% in bed, but not bedbound (Capable of only limited self-care, confined to bed or chair 50% or more of waking hours)  4 - Bedbound (Completely disabled. Cannot carry on any self-care. Totally confined to bed or chair)  5 - Death   Eustace Pen MM, Creech RH, Tormey DC, et al. [phone]). "Toxicity and response criteria of the [facility] Group". [place] Oncol. 5 (6): 649-55   LABORATORY DATA:  Lab Results  Component Value Date   WBC 9.3 06/15/2016   HGB 15.2 06/15/2016   HCT 44.5 06/15/2016   MCV 87.4 06/15/2016   PLT 259 06/15/2016   CMP     Component Value Date/Time   NA 141 06/15/2016 0852   K 4.2 06/15/2016 0852   CL 109 01/28/2016 1308   CO2 25 06/15/2016 0852   GLUCOSE 151 (H) 06/15/2016 0852   BUN 8.3 06/15/2016 0852   CREATININE 0.8 06/15/2016 0852   CALCIUM 9.6 06/15/2016 0852   PROT 7.3 06/15/2016 0852   ALBUMIN 4.0 06/15/2016 0852   AST 29 06/15/2016 0852   ALT 40 06/15/2016 0852   ALKPHOS 74 06/15/2016 0852   BILITOT 0.71 06/15/2016 0852   GFRNONAA >60 12/17/2009 0357   GFRAA  12/17/2009 0357    >60        The eGFR has been calculated using the MDRD equation. This calculation has not been validated in all clinical situations. eGFR's persistently <60 mL/min signify possible Chronic Kidney Disease.         RADIOGRAPHY: Mm Clip Placement Right  Result Date: 06/08/2016 CLINICAL DATA:  Status post stereotactic biopsy for a right breast mass earlier today. EXAM: DIAGNOSTIC RIGHT  MAMMOGRAM POST STEREOTACTIC BIOPSY COMPARISON:  Previous exam(s). FINDINGS: Mammographic images were obtained following stereotactic guided biopsy of the right breast mass, lower outer quadrant, at posterior depth. At the conclusion of the procedure, a coil shaped tissue marker was placed at the biopsy site. Biopsy clip is appropriately positioned within lower outer quadrant at posterior depth. IMPRESSION: Coil shaped biopsy clip appropriately positioned within the lower outer quadrant of the right breast, at posterior depth. Postprocedure hematoma at the biopsy site obscures the mass on this postprocedure examination. Final Assessment: Post Procedure Mammograms  for Marker Placement Electronically Signed   By: Franki Cabot M.D.   On: 06/08/2016 11:37   Mm Rt Breast Bx W Loc Dev 1st Lesion Image Bx Spec Stereo Guide  Addendum Date: 06/09/2016   ADDENDUM REPORT: 06/09/2016 11:44 ADDENDUM: Pathology revealed grade II invasive ductal carcinoma in the right breast. This was found to be concordant by Dr. Franki Cabot. Pathology results were discussed with the patient by telephone. The patient reported doing well after the biopsy with tenderness and bruising at the site. Post biopsy instructions and care were reviewed and questions were answered. The patient was encouraged to call [facility] for any additional concerns. The patient was referred to the [facility] at the [facility] on Jun 15, 2016. Pathology results reported by Susa Raring RN, BSN on 06/09/2016. Electronically Signed   By: Franki Cabot M.D.   On: 06/09/2016 11:44   Result Date: 06/09/2016 CLINICAL DATA:  Patient with a right breast mass presents today for stereotactic biopsy using 3D tomosynthesis guidance. EXAM: RIGHT BREAST STEREOTACTIC CORE NEEDLE BIOPSY COMPARISON:  Previous exams. FINDINGS: The patient and I discussed the procedure of stereotactic-guided biopsy including  benefits and alternatives. We discussed the high likelihood of a successful procedure. We discussed the risks of the procedure including infection, bleeding, tissue injury, clip migration, and inadequate sampling. Informed written consent was given. The usual time out protocol was performed immediately prior to the procedure. Using sterile technique and 1% Lidocaine as local anesthetic, under stereotactic guidance, a 9 gauge vacuum assisted device was used to perform core needle biopsy of the mass in the lower outer quadrant of the right breast using a lateral approach. Lesion quadrant: Lower outer quadrant At the conclusion of the procedure, a coil shaped tissue marker clip was deployed into the biopsy cavity. Follow-up 2-view mammogram was performed and dictated separately. IMPRESSION: Stereotactic-guided biopsy of the right breast mass, lower outer quadrant. No apparent complications. Electronically Signed: By: Franki Cabot M.D. On: 06/08/2016 11:16      IMPRESSION/PLAN: Stage T1b M0 N0 Right Breast LOQ Invasive Ductal Carcinoma, ER+ / PR+ / Her2 neg, Grade II   She has been discussed at our multidisciplinary tumor board.  The consensus is that she would be a good candidate for breast conservation. I talked to her about the option of a mastectomy and informed her that her expected overall survival would be equivalent between mastectomy and breast conservation, based upon randomized controlled data. She is enthusiastic about breast conservation.  It was a pleasure meeting the patient today. We discussed the risks, benefits, and side effects of radiotherapy. I recommend radiotherapy to the right breast to reduce her risk of locoregional recurrence by 2/3.  We discussed that radiation would take approximately 4 weeks to complete and that I would give the patient a few weeks to heal following surgery before starting treatment planning.  If chemotherapy were to be given, this would precede radiotherapy. We  spoke about acute effects including skin irritation and fatigue as well as much less common late effects including internal organ injury or irritation. We spoke about the latest technology that is used to minimize the risk of late effects for patients undergoing radiotherapy to the breast or chest wall. No guarantees of treatment were given. The patient is enthusiastic about proceeding with treatment. She is unsure whether she would like to proceed with radiation treatment in [place] or in [place]. She will contact our clinic with  her decision in the near future.   I told her that I think the clinic in [place] at [facility] would serve her well; I am not sure if the MD up there would recommend 4 weeks or 6 weeks of RT.  I look forward to participating in the patient's care, if decided.     I asked the patient today about tobacco use. The patient uses tobacco.  I advised the patient to quit. Services were offered by me today including outpatient counseling and pharmacotherapy. I assessed for the willingness to attempt to quit and provided encouragement and demonstrated willingness to make referrals and/or prescriptions to help the patient attempt to quit. The patient has follow-up with the oncologic team to touch base on their tobacco use and /or cessation efforts.  Over 3 minutes were spent on this issue. We discussed 1-800-QUITNOW, and she was offered pharmacotherapy and further counseling to help with this. The patient declined pharmacotherapy and further counseling at this time. Today we set a quit date of June 30th, 2018.  __________________________________________   Eppie Gibson, MD  This document serves as a record of services personally performed by Eppie Gibson, MD. It was created on her behalf by Arlyce Harman, a trained medical scribe. The creation of this record is based on the scribe's personal observations and the provider's statements to them. This document has been checked and  approved by the attending provider.

## 2016-06-15 NOTE — Progress Notes (Signed)
[place] NOTE  Patient Care Team: Neale Burly, MD as PCP - General (Internal Medicine) Excell Seltzer, MD as Consulting Physician (General Surgery) Nicholas Lose, MD as Consulting Physician (Hematology and Oncology) Eppie Gibson, MD as Attending Physician (Radiation Oncology)  CHIEF COMPLAINTS/PURPOSE OF CONSULTATION:  Newly diagnosed breast cancer  HISTORY OF PRESENTING ILLNESS:  Tabitha [name] 61 y.o. female is here because of recent diagnosis of right breast cancer. Patient presented with the routine screening mammogram that revealed a right breast asymmetry measuring 7 mm in size. There was no ultrasound correlate for this. She had a stereotactic biopsy which revealed that this was a grade 2 invasive ductal cancer that was ER/PR positive HER-2 negative with a Ki-67 of 10%. She was presented this morning in the multidisciplinary tumor board and she is here today to discuss a treatment plan at the [facility] clinic. Patient has family history of breast cancer in 2 paternal aunts and maternal grandmother.   I reviewed her records extensively and collaborated the history with the patient.  SUMMARY OF ONCOLOGIC HISTORY:   Malignant neoplasm of lower-outer quadrant of right breast of female, estrogen receptor positive ([place])   06/08/2016 Initial Diagnosis    Right breast asymmetry posteriorly 7 mm size, no ultrasound correlate, AFFIRM biopsy: Grade 2 invasive ductal carcinoma ER 100%, PR 95%, Ki-67 10%, HER-2 negative ratio 1.58, T1 BN 0 stage IA clinical stage      MEDICAL HISTORY:  Past Medical History:  Diagnosis Date  . Chronic back pain   . Chronic pancreatitis ([place]) 01/27/2016  . Diabetes ([place]) 01/27/2016  . Glaucoma 01/27/2016  . High cholesterol 01/27/2016  . Hypertension     SURGICAL HISTORY: Past Surgical History:  Procedure Laterality Date  . APPENDECTOMY    . BACK SURGERY     x 2  . BREAST REDUCTION SURGERY    . CHOLECYSTECTOMY    .  ESOPHAGOGASTRODUODENOSCOPY N/A 03/23/2016   Procedure: ESOPHAGOGASTRODUODENOSCOPY (EGD);  Surgeon: Rogene Houston, MD;  [facility];  Service: Endoscopy;  Laterality: N/A;  3:00  . PARTIAL HYSTERECTOMY    . rt foot spur    . rt shoulder rototar cuff      SOCIAL HISTORY: Social History   Social History  . Marital status: Married    Spouse name: N/A  . Number of children: N/A  . Years of education: N/A   Occupational History  . Not on file.   Social History Main Topics  . Smoking status: Current Some Day Smoker    Packs/day: 0.50    Types: Cigarettes  . Smokeless tobacco: Never Used  . Alcohol use No  . Drug use: No  . Sexual activity: Yes    Partners: Male   Other Topics Concern  . Not on file   Social History Narrative  . No narrative on file    FAMILY HISTORY: Family History  Problem Relation Age of Onset  . Cancer Father   . Diabetes Sister   . Breast cancer Sister   . Esophageal varices Brother   . Diabetes Sister     ALLERGIES:  is allergic to ampicillin; barium-containing compounds; morphine and related; and penicillins.  MEDICATIONS:  Current Outpatient Prescriptions  Medication Sig Dispense Refill  . alendronate (FOSAMAX) 70 MG tablet Take 70 mg by mouth once a week. Take with a full glass of water on an empty stomach.    . ALPRAZolam (XANAX) 0.5 MG tablet Take 0.5 mg by mouth at  bedtime as needed for anxiety.    Marland Kitchen amLODipine (NORVASC) 5 MG tablet Take 5 mg by mouth daily.    Marland Kitchen atorvastatin (LIPITOR) 20 MG tablet Take 20 mg by mouth daily.    Marland Kitchen latanoprost (XALATAN) 0.005 % ophthalmic solution Place 1 drop into both eyes at bedtime.    Marland Kitchen lisinopril (PRINIVIL,ZESTRIL) 40 MG tablet Take 40 mg by mouth daily.    . metFORMIN (GLUCOPHAGE) 500 MG tablet Take 1,000 mg by mouth 2 (two) times daily with a meal. Patient takes 2 po in the morning , and 2 po in the evening.     Marland Kitchen oxyCODONE-acetaminophen (PERCOCET) 10-325 MG tablet Take 1 tablet by  mouth 3 (three) times daily.    . Pancrelipase, Lip-Prot-Amyl, (CREON) 24000-76000 units CPEP Take 2 capsules by mouth 3 (three) times daily before meals.     . triamterene-hydrochlorothiazide (MAXZIDE) 75-50 MG tablet Take 1 tablet by mouth daily.    Marland Kitchen anastrozole (ARIMIDEX) 1 MG tablet Take 1 tablet (1 mg total) by mouth daily. 30 tablet 6  . bismuth-metronidazole-tetracycline (PYLERA) 140-125-125 MG capsule Take 3 capsules by mouth 4 (four) times daily -  before meals and at bedtime. (Patient not taking: Reported on 06/15/2016) 120 capsule 0  . cholecalciferol (VITAMIN D) 1000 units tablet Take 1 tablet by mouth daily.     Marland Kitchen dicyclomine (BENTYL) 10 MG capsule Take 1 capsule (10 mg total) by mouth 3 (three) times daily before meals. (Patient not taking: Reported on 06/15/2016) 90 capsule 1  . omeprazole (PRILOSEC) 20 MG capsule Take 1 capsule (20 mg total) by mouth 2 (two) times daily before a meal. (Patient not taking: Reported on 06/15/2016) 30 capsule 0   No current facility-administered medications for this visit.     REVIEW OF SYSTEMS:   Constitutional: Denies fevers, chills or abnormal night sweats Eyes: Denies blurriness of vision, double vision or watery eyes Ears, nose, mouth, throat, and face: Denies mucositis or sore throat Respiratory: Denies cough, dyspnea or wheezes Cardiovascular: Denies palpitation, chest discomfort or lower extremity swelling Gastrointestinal:  Denies nausea, heartburn or change in bowel habits Skin: Denies abnormal skin rashes Lymphatics: Denies new lymphadenopathy or easy bruising Neurological:Denies numbness, tingling or new weaknesses Behavioral/Psych: Mood is stable, no new changes  Breast:  Bruising related to the recent biopsy, prior breast reduction surgery bilaterally All other systems were reviewed with the patient and are negative.  PHYSICAL EXAMINATION: ECOG PERFORMANCE STATUS: 1 - Symptomatic but completely ambulatory  Vitals:   06/15/16 0920   BP: (!) 144/75  Pulse: 72  Resp: 18  Temp: 98.3 F (36.8 C)   Filed Weights   06/15/16 0920  Weight: 193 lb 9.6 oz (87.8 kg)    GENERAL:alert, no distress and comfortable SKIN: skin color, texture, turgor are normal, no rashes or significant lesions EYES: normal, conjunctiva are pink and non-injected, sclera clear OROPHARYNX:no exudate, no erythema and lips, buccal mucosa, and tongue normal  NECK: supple, thyroid normal size, non-tender, without nodularity LYMPH:  no palpable lymphadenopathy in the cervical, axillary or inguinal LUNGS: clear to auscultation and percussion with normal breathing effort HEART: regular rate & rhythm and no murmurs and no lower extremity edema ABDOMEN:abdomen soft, non-tender and normal bowel sounds Musculoskeletal:no cyanosis of digits and no clubbing  PSYCH: alert & oriented x 3 with fluent speech NEURO: no focal motor/sensory deficits BREAST: Bruising and right breast related to the breast biopsy. Prior breast reduction surgery scars on both sides. No palpable axillary or supraclavicular lymphadenopathy (  exam performed in the presence of a chaperone)   LABORATORY DATA:  I have reviewed the data as listed Lab Results  Component Value Date   WBC 9.3 06/15/2016   HGB 15.2 06/15/2016   HCT 44.5 06/15/2016   MCV 87.4 06/15/2016   PLT 259 06/15/2016   Lab Results  Component Value Date   NA 141 06/15/2016   K 4.2 06/15/2016   CL 109 01/28/2016   CO2 25 06/15/2016    RADIOGRAPHIC STUDIES: I have personally reviewed the radiological reports and agreed with the findings in the report.  ASSESSMENT AND PLAN:  Malignant neoplasm of lower-outer quadrant of right breast of female, estrogen receptor positive ([place])  06/08/2016: Right breast asymmetry posteriorly 7 mm size, no ultrasound correlate, AFFIRM biopsy: Grade 2 invasive ductal carcinoma ER 100%, PR 95%, Ki-67 10%, HER-2 negative ratio 1.58, T1 BN 0 stage IA clinical stage  Pathology and  radiology counseling:Discussed with the patient, the details of pathology including the type of breast cancer,the clinical staging, the significance of ER, PR and HER-2/neu receptors and the implications for treatment. After reviewing the pathology in detail, we proceeded to discuss the different treatment options between surgery, radiation, chemotherapy, antiestrogen therapies.  Recommendations:Genetic counseling and testing (2 paternal aunts with breast cancer) 1. Breast conserving surgery followed by 2. Oncotype DX testing to determine if chemotherapy would be of any benefit followed by 3. Adjuvant radiation therapy followed by 4. Adjuvant antiestrogen therapy  Oncotype counseling: I discussed Oncotype DX test. I explained to the patient that this is a 21 gene panel to evaluate patient tumors DNA to calculate recurrence score. This would help determine whether patient has high risk or intermediate risk or low risk breast cancer. She understands that if her tumor was found to be high risk, she would benefit from systemic chemotherapy. If low risk, no need of chemotherapy. If she was found to be intermediate risk, we would need to evaluate the score as well as other risk factors and determine if an abbreviated chemotherapy may be of benefit.  Return to clinic after surgery to discuss final pathology report and then determine if Oncotype DX testing will need to be sent.     All questions were answered. The patient knows to call the clinic with any problems, questions or concerns.    Rulon Eisenmenger, MD 06/15/16

## 2016-06-15 NOTE — Patient Instructions (Signed)

## 2016-06-15 NOTE — Assessment & Plan Note (Signed)
06/08/2016: Right breast asymmetry posteriorly 7 mm size, no ultrasound correlate, AFFIRM biopsy: Grade 2 invasive ductal carcinoma ER 100%, PR 95%, Ki-67 10%, HER-2 negative ratio 1.58, T1 BN 0 stage IA clinical stage  Pathology and radiology counseling:Discussed with the patient, the details of pathology including the type of breast cancer,the clinical staging, the significance of ER, PR and HER-2/neu receptors and the implications for treatment. After reviewing the pathology in detail, we proceeded to discuss the different treatment options between surgery, radiation, chemotherapy, antiestrogen therapies.  Recommendations:Genetic counseling and testing (2 paternal aunts with breast cancer) 1. Breast conserving surgery followed by 2. Oncotype DX testing to determine if chemotherapy would be of any benefit followed by 3. Adjuvant radiation therapy followed by 4. Adjuvant antiestrogen therapy  Oncotype counseling: I discussed Oncotype DX test. I explained to the patient that this is a 21 gene panel to evaluate patient tumors DNA to calculate recurrence score. This would help determine whether patient has high risk or intermediate risk or low risk breast cancer. She understands that if her tumor was found to be high risk, she would benefit from systemic chemotherapy. If low risk, no need of chemotherapy. If she was found to be intermediate risk, we would need to evaluate the score as well as other risk factors and determine if an abbreviated chemotherapy may be of benefit.  Return to clinic after surgery to discuss final pathology report and then determine if Oncotype DX testing will need to be sent.

## 2016-06-15 NOTE — Progress Notes (Signed)
Nutrition Assessment  Reason for Assessment:  Pt seen in Breast Clinic  ASSESSMENT:   61 year old female with new diagnosis of breast cancer.  Past medical history of DM, HTN, high cholesterol, chronic pancreatitis.  Medications:  reviewed  Labs: reviewed  Anthropometrics:   Height: 64 inches Weight: 193 lb 9.6 oz BMI: 33.3   NUTRITION DIAGNOSIS: Food and nutrition related knowledge deficit related to new diagnosis of breast cancer as evidenced by no prior need for nutrition related information.  INTERVENTION:   Discussed and provided packet of information regarding nutritional tips for breast cancer patients.  Questions answered.  Teachback method used.  Contact information provided and patient knows to contact me with questions/concerns.    MONITORING, EVALUATION, and GOAL: Pt will consume a healthy plant based diet to maintain lean body mass throughout treatment.   Alec Jaros B. Zenia Resides, [place], [place] Registered Dietitian [phone] (pager)

## 2016-06-15 NOTE — Therapy (Signed)
[place], [place], [address] Phone: [phone]   Fax:  [phone]  Physical Therapy Evaluation  Patient Details  Name: Tabitha [name] MRN: [id number] Date of Birth: [birth year] Referring Provider: Dr. Excell Seltzer  Encounter Date: 06/15/2016      PT End of Session - 06/15/16 1056    Visit Number 1   Number of Visits 1   PT Start Time 1005   PT Stop Time 1031   PT Time Calculation (min) 26 min   Activity Tolerance Patient tolerated treatment well   Behavior During Therapy [facility] for tasks assessed/performed      Past Medical History:  Diagnosis Date  . Chronic back pain   . Chronic pancreatitis ([place]) 01/27/2016  . Diabetes ([place]) 01/27/2016  . Glaucoma 01/27/2016  . High cholesterol 01/27/2016  . Hypertension     Past Surgical History:  Procedure Laterality Date  . APPENDECTOMY    . BACK SURGERY     x 2  . BREAST REDUCTION SURGERY    . CHOLECYSTECTOMY    . ESOPHAGOGASTRODUODENOSCOPY N/A 03/23/2016   Procedure: ESOPHAGOGASTRODUODENOSCOPY (EGD);  Surgeon: Rogene Houston, MD;  Location[facility];  Service: Endoscopy;  Laterality: N/A;  3:00  . PARTIAL HYSTERECTOMY    . rt foot spur    . rt shoulder rototar cuff      There were no vitals filed for this visit.       Subjective Assessment - 06/15/16 1037    Subjective Patient reports she is here today with her family to be seen by her medical team for her newly diagnosed right breast cancer.   Patient is accompained by: Family member   Pertinent History Patient was diagnosed on 05/27/16 with right grade 2 invasive ductal carcinoma breast cancer. It measures 7 mm and is located in the lower outer quadrant. It is ER/PR positive and HER2 negative with a Ki67 of 10%. She has had multiple back surgeries and smokes 1/2 pack per day.   Patient Stated Goals Reduce lymphedema risk and learn post op shoulder ROM HEP   Currently in Pain? Yes    Pain Score 5    Pain Location Back   Pain Orientation Lower   Pain Descriptors / Indicators Constant   Pain Type Chronic pain   Pain Radiating Towards unknown   Pain Onset More than a month ago   Pain Frequency Constant   Aggravating Factors  Unknown - pain is constant   Pain Relieving Factors Unknown - pain is constant   Multiple Pain Sites No            [facility] PT Assessment - 06/15/16 0001      Assessment   Medical Diagnosis Right breast cancer   Referring Provider Dr. Excell Seltzer   Onset Date/Surgical Date 05/27/16   Hand Dominance Right   Prior Therapy none     Precautions   Precautions Other (comment)   Precaution Comments Active cancer     Restrictions   Weight Bearing Restrictions No     Balance Screen   Has the patient fallen in the past 6 months No   Has the patient had a decrease in activity level because of a fear of falling?  No   Is the patient reluctant to leave their home because of a fear of falling?  No     Home Social worker Private residence   Living Arrangements Spouse/significant other;Children  Husband and  5 y.o. daughter   Available Help at Discharge Family     Prior Function   Level of Independence Independent   Vocation On disability   Vocation Requirements On disability for back pain   Leisure She reports that she walks 3x/week for 15 minutes with her grandchild     Cognition   Overall Cognitive Status Within Functional Limits for tasks assessed     Posture/Postural Control   Posture/Postural Control Postural limitations   Postural Limitations Forward head;Rounded Shoulders;Decreased lumbar lordosis;Increased thoracic kyphosis  Very poor posture     ROM / Strength   AROM / PROM / Strength AROM;Strength     AROM   AROM Assessment Site Shoulder;Cervical   Right/Left Shoulder Right;Left   Right Shoulder Extension 27 Degrees   Right Shoulder Flexion 102 Degrees   Right Shoulder ABduction 122 Degrees    Right Shoulder Internal Rotation 52 Degrees   Right Shoulder External Rotation 68 Degrees   Left Shoulder Extension 31 Degrees   Left Shoulder Flexion 140 Degrees   Left Shoulder ABduction 161 Degrees   Left Shoulder Internal Rotation 58 Degrees   Left Shoulder External Rotation 72 Degrees   Cervical Flexion WNL   Cervical Extension WNL   Cervical - Right Side Bend 25% limited   Cervical - Left Side Bend WNL   Cervical - Right Rotation 25% limited   Cervical - Left Rotation WNL     Strength   Overall Strength Within functional limits for tasks performed           LYMPHEDEMA/ONCOLOGY QUESTIONNAIRE - 06/15/16 1052      Type   Cancer Type Right breast cancer     Lymphedema Assessments   Lymphedema Assessments Upper extremities     Right Upper Extremity Lymphedema   10 cm Proximal to Olecranon Process 29.2 cm   Olecranon Process 24.9 cm   10 cm Proximal to Ulnar Styloid Process 21.7 cm   Just Proximal to Ulnar Styloid Process 16.4 cm   Across Hand at [employer] 18 cm   At [place] of 2nd Digit 6.1 cm     Left Upper Extremity Lymphedema   10 cm Proximal to Olecranon Process 30.5 cm   Olecranon Process 26 cm   10 cm Proximal to Ulnar Styloid Process 22.5 cm   Just Proximal to Ulnar Styloid Process 17.1 cm   Across Hand at [employer] 18.4 cm   At [place] of 2nd Digit 6.1 cm      Patient was instructed today in a home exercise program today for post op shoulder range of motion. These included active assist shoulder flexion in sitting, scapular retraction, wall walking with shoulder abduction, and hands behind head external rotation.  She was encouraged to do these twice a day, holding 3 seconds and repeating 5 times when permitted by her physician.         PT Education - 06/15/16 1056    Education provided Yes   Education Details Lymphedema risk reduction and post op shoulder ROM HEP   Person(s) Educated Patient;Child(ren)   Methods  Explanation;Demonstration;Handout   Comprehension Returned demonstration;Verbalized understanding              Breast Clinic Goals - 06/15/16 1104      Patient will be able to verbalize understanding of pertinent lymphedema risk reduction practices relevant to her diagnosis specifically related to skin care.   Time 1   Period Days   Status Achieved  Patient will be able to return demonstrate and/or verbalize understanding of the post-op home exercise program related to regaining shoulder range of motion.   Time 1   Period Days   Status Achieved     Patient will be able to verbalize understanding of the importance of attending the postoperative After Breast Cancer Class for further lymphedema risk reduction education and therapeutic exercise.   Time 1   Period Days   Status Achieved              Plan - 07-13-16 1057    Clinical Impression Statement Patient was diagnosed on 05/27/16 with right grade 2 invasive ductal carcinoma breast cancer. It measures 7 mm and is located in the lower outer quadrant. It is ER/PR positive and HER2 negative with a Ki67 of 10%. She has had multiple back surgeries and smokes 1/2 pack per day. Her multidisciplinary medical team met prior to her assessments to determine a recommended treatment plan. She is planning to have a right lumpectomy and sentinel node biopsy followed by radiation and anti-estrogen therapy. Due to her back issues which limit her ability to ambulate and exercise and due to lmited right shoulder ROM at baseline, she will benefit from post op PT to improve and regain function. Due to her comorbidities and evolving condition, her eval is of moderate complexity.   Rehab Potential Good   Clinical Impairments Affecting Rehab Potential Multiple back surgery history and overall health condition   PT Frequency One time visit   PT Treatment/Interventions Therapeutic exercise;Patient/family education   PT Next Visit Plan Will f/u  after surgery to determine PT needs   PT Home Exercise Plan Post op shoulder ROM HEP   Consulted and Agree with Plan of Care Patient;Family member/caregiver   Family Member Consulted sister and daughters      Patient will benefit from skilled therapeutic intervention in order to improve the following deficits and impairments:  Postural dysfunction, Decreased knowledge of precautions, Pain, Impaired UE functional use, Decreased range of motion, Decreased mobility  Visit Diagnosis: Carcinoma of lower-outer quadrant of right breast in female, estrogen receptor positive ([place]) - Plan: PT plan of care cert/re-cert  Abnormal posture - Plan: PT plan of care cert/re-cert  Stiffness of right shoulder, not elsewhere classified - Plan: PT plan of care cert/re-cert  Patient will follow up at outpatient cancer rehab if needed following surgery.  If the patient requires physical therapy at that time, a specific plan will be dictated and sent to the referring physician for approval. The patient was educated today on appropriate basic range of motion exercises to begin post operatively and the importance of attending the After Breast Cancer class following surgery.  Patient was educated today on lymphedema risk reduction practices as it pertains to recommendations that will benefit the patient immediately following surgery.  She verbalized good understanding.  No additional physical therapy is indicated at this time.        G-Codes - Jul 13, 2016 1104    Functional Assessment Tool Used (Outpatient Only) Clinical Judgement   Functional Limitation Other PT primary   Other PT Primary Current Status (X3818) At least 20 percent but less than 40 percent impaired, limited or restricted   Other PT Primary Goal Status (E9937) At least 20 percent but less than 40 percent impaired, limited or restricted   Other PT Primary Discharge Status (J6967) At least 20 percent but less than 40 percent impaired, limited or restricted        Problem  List Patient Active Problem List   Diagnosis Date Noted  . Malignant neoplasm of lower-outer quadrant of right breast of female, estrogen receptor positive ([place]) 06/14/2016  . Pain of upper abdomen 02/12/2016  . Diabetes ([place]) 01/27/2016  . High cholesterol 01/27/2016  . Essential hypertension 01/27/2016  . Glaucoma 01/27/2016  . Chronic pancreatitis ([place]) 01/27/2016  . Hypertension    Annia Friendly, [place] 06/15/16 11:06 AM  [place], [place], [address] Phone: [phone]   Fax:  [phone]  Name: SHANAE LUO MRN: [id number] Date of Birth: [birth year]

## 2016-06-16 ENCOUNTER — Other Ambulatory Visit: Payer: PPO

## 2016-06-16 ENCOUNTER — Encounter: Payer: Self-pay | Admitting: Genetics

## 2016-06-16 ENCOUNTER — Encounter: Payer: PPO | Admitting: Genetics

## 2016-06-20 ENCOUNTER — Telehealth: Payer: Self-pay | Admitting: *Deleted

## 2016-06-20 NOTE — Telephone Encounter (Signed)
Spoke to pt concerning Tabitha [name] from 06/15/16. Denies questions or concerns regarding dx or treatment care plan. Encourage pt to call with needs. Received verbal understanding.

## 2016-06-23 DIAGNOSIS — I10 Essential (primary) hypertension: Secondary | ICD-10-CM | POA: Diagnosis not present

## 2016-06-23 DIAGNOSIS — E784 Other hyperlipidemia: Secondary | ICD-10-CM | POA: Diagnosis not present

## 2016-06-23 DIAGNOSIS — E1142 Type 2 diabetes mellitus with diabetic polyneuropathy: Secondary | ICD-10-CM | POA: Diagnosis not present

## 2016-06-23 DIAGNOSIS — E861 Hypovolemia: Secondary | ICD-10-CM | POA: Diagnosis not present

## 2016-06-27 NOTE — Progress Notes (Signed)
REFERRING PROVIDER: Nicholas Lose, MD [place], [place] [phone]  PRIMARY PROVIDER:  Neale Burly, MD  PRIMARY REASON FOR VISIT:  1. Malignant neoplasm of right breast in female, estrogen receptor positive, unspecified site of breast ([place])     HISTORY OF PRESENT ILLNESS:   Tabitha [name], a 61 y.o. female, was seen for a [place] cancer genetics consultation at the request of Dr. Lindi Adie due to a personal and family history of cancer.  Tabitha [name] presents to clinic today to discuss the possibility of a hereditary predisposition to cancer, genetic testing, and to further clarify her future cancer risks, as well as potential cancer risks for family members.   In May 2018, at the age of 20, Tabitha [name] was diagnosed with ER/PR+ HER2- invasive ductal carcinoma of the right breast. Her treatment is pending. Tabitha [name]'s health history is also significant for a carcinoid tumor of her colon in her 57s. Her most recent colonoscopy, performed in December 2014, revealed 3 adenomatous polyps and a hyperplastic polyp.    CANCER HISTORY:    Malignant neoplasm of lower-outer quadrant of right breast of female, estrogen receptor positive ([place])   06/08/2016 Initial Diagnosis    Right breast asymmetry posteriorly 7 mm size, no ultrasound correlate, AFFIRM biopsy: Grade 2 invasive ductal carcinoma ER 100%, PR 95%, Ki-67 10%, HER-2 negative ratio 1.58, T1 BN 0 stage IA clinical stage       Past Medical History:  Diagnosis Date  . Chronic back pain   . Chronic pancreatitis ([place]) 01/27/2016  . Diabetes ([place] ) 01/27/2016  . Glaucoma 01/27/2016  . High cholesterol 01/27/2016  . Hypertension     Past Surgical History:  Procedure Laterality Date  . APPENDECTOMY    . BACK SURGERY     x 2  . BREAST REDUCTION SURGERY    . CHOLECYSTECTOMY    . ESOPHAGOGASTRODUODENOSCOPY N/A 03/23/2016   Procedure: ESOPHAGOGASTRODUODENOSCOPY (EGD);  Surgeon: Rogene Houston, MD;  Location: [facility];  Service: Endoscopy;  Laterality: N/A;  3:00  . PARTIAL HYSTERECTOMY    . rt foot spur    . rt shoulder rototar cuff      Social History   Social History  . Marital status: Married    Spouse name: N/A  . Number of children: N/A  . Years of education: N/A   Social History Main Topics  . Smoking status: Current Some Day Smoker    Packs/day: 0.50    Types: Cigarettes  . Smokeless tobacco: Never Used  . Alcohol use No  . Drug use: No  . Sexual activity: Yes    Partners: Male   Other Topics Concern  . Not on file   Social History Narrative  . No narrative on file     FAMILY HISTORY:  We obtained a detailed, 4-generation family history.  Significant diagnoses are listed below: Family History  Problem Relation Age of Onset  . Prostate cancer Father        d.69  . Diabetes Sister   . Breast cancer Sister 65       Maternal half-sister  . Esophageal varices Brother   . Diabetes Sister   . Breast cancer Paternal Aunt   . Brain cancer Paternal Uncle   . Breast cancer Maternal Grandmother        d.60s  . Breast cancer Paternal Aunt   . Skin cancer Paternal Uncle    Tabitha [name] has one biological daughter, age 71, who  has no history of cancer. Tabitha [name] also adopted her brother's daughter as an infant. This niece/daughter is also cancer-free. Tabitha [name]'s full-brother died at age 74 from liver disease related to Hepatitis/drug use. Tabitha [name]'s full sister is 44 without cancers. Tabitha [name]'s maternal half-sister is currently 10 and had breast cancer in her 79s.  Tabitha [name]'s mother died at age 24 in a car accident. She had no previous illnesses/cancers. Both of Tabitha [name]'s maternal aunts are deceased without cancers. Her mother's maternal half-brother is also deceased without cancer. Her maternal grandmother died in her 71s and had a history of breast cancer. Her maternal grandfather is deceased without cancer.  Tabitha [name]'s father died at 71 with a history of prostate  cancer. Tabitha [name] reported that when her father died, he "had tumors everywhere". Tabitha [name] had two paternal aunts with breast cancer. One is living, the other is deceased. A paternal uncle died of brain cancer and another died with skin cancer. Tabitha [name]'s father had 11 siblings in total. Tabitha [name]'s paternal grandmother died in her late-60s without cancer. There is no information about her paternal grandfather.  Tabitha [name] is unaware of previous family history of genetic testing for hereditary cancer risks. Patient's maternal and paternal ancestors are of Caucasian descent. There is no reported Ashkenazi Jewish ancestry. There is no known consanguinity.  GENETIC COUNSELING ASSESSMENT: NOVEMBER SYPHER is a 61 y.o. female with a personal and family history which is somewhat suggestive of a hereditary cancer syndrome and predisposition to cancer. We, therefore, discussed and recommended the following at today's visit.   DISCUSSION: We reviewed the characteristics, features and inheritance patterns of hereditary cancer syndromes. We also discussed genetic testing, including the appropriate family members to test, the process of testing, insurance coverage and turn-around-time for results. We discussed the implications of a negative, positive and/or variant of uncertain significant result. In order to get genetic test results in a timely manner so that Tabitha [name] can use these genetic test results for surgical decisions, we recommended Tabitha [name] pursue genetic testing for the 9-gene High/Moderate Breast Risk STAT Panel offered by [facility]. If this test is negative, we then recommend Tabitha [name] pursue reflex genetic testing to the 46-gene Common Hereditary Cancer Panel. [facility]'s Common Hereditary Cancers Panel includes analysis of the following 46 genes: APC, ATM, AXIN2, BARD1, BMPR1A, BRCA1, BRCA2, BRIP1, CDH1, CDKN2A, CHEK2, CTNNA1, DICER1, EPCAM, GREM1, HOXB13, KIT, MEN1, MLH1, MSH2, MSH3, MSH6, MUTYH,  NBN, NF1, NTHL1, PALB2, PDGFRA, PMS2, POLD1, POLE, PTEN, RAD50, RAD51C, RAD51D, SDHA, SDHB, SDHC, SDHD, SMAD4, SMARCA4, STK11, TP53, TSC1, TSC2, and VHL.   Based on Tabitha [name]'s personal and family history of cancer, she meets medical criteria for genetic testing. Despite that she meets criteria, she may still have an out of pocket cost. We discussed that if her out of pocket cost for testing is over $100, the laboratory will call and confirm whether she wants to proceed with testing.  If the out of pocket cost of testing is less than $100 she will be billed by the genetic testing laboratory.   PLAN: After considering the risks, benefits, and limitations, Ms. Mcglothlin  provided informed consent to pursue genetic testing and the blood sample was sent to [facility] for analysis of the 9-gene High/Moderate Breast Risk STAT Panel followed by [facility]'s 46-gene Common Hereditary Cancers Panel. Results for the STAT panel should be available within approximately 2 weeks' time, at which point they will be disclosed by telephone to Ms. Jay,  as will any additional recommendations warranted by these results. Ms. Goebel will receive a summary of her genetic counseling visit and a copy of her results once available. This information will also be available in Epic.   Lastly, we encouraged Ms. Martindelcampo to remain in contact with cancer genetics annually so that we can continuously update the family history and inform her of any changes in cancer genetics and testing that may be of benefit for this family.   Ms.  Felix's questions were answered to her satisfaction today. Our contact information was provided should additional questions or concerns arise. Thank you for the referral and allowing [place] to share in the care of your patient.   Mal Misty, MS, [facility] Certified Naval [email or web address] phone: [phone]  The patient was seen for a total of 35 minutes in face-to-face genetic counseling.   This patient was discussed with Drs. Magrinat, Lindi Adie and/or Burr Medico who agrees with the above.    _______________________________________________________________________ For Office Staff:  Number of people involved in session: 1 Was an Intern/ student involved with case: no

## 2016-06-30 ENCOUNTER — Other Ambulatory Visit: Payer: Self-pay | Admitting: General Surgery

## 2016-06-30 ENCOUNTER — Encounter (HOSPITAL_BASED_OUTPATIENT_CLINIC_OR_DEPARTMENT_OTHER): Payer: Self-pay | Admitting: *Deleted

## 2016-06-30 DIAGNOSIS — C50911 Malignant neoplasm of unspecified site of right female breast: Secondary | ICD-10-CM

## 2016-07-01 ENCOUNTER — Telehealth: Payer: Self-pay | Admitting: Genetics

## 2016-07-01 DIAGNOSIS — J028 Acute pharyngitis due to other specified organisms: Secondary | ICD-10-CM | POA: Diagnosis not present

## 2016-07-01 DIAGNOSIS — Z6832 Body mass index (BMI) 32.0-32.9, adult: Secondary | ICD-10-CM | POA: Diagnosis not present

## 2016-07-01 NOTE — Telephone Encounter (Signed)
Reviewed that the 9-gene high/moderate breast risk STAT panel performed by [facility] was negative for mutations. Will reflex to remaining genes included on [facility]'s 46-gene Common Hereditary Cancer panel. Will call patient when remaining results are available. Final risk assessment and documentation will be made at that time. A portion of her STAT panel results are included below for reference.  

## 2016-07-05 ENCOUNTER — Encounter (HOSPITAL_BASED_OUTPATIENT_CLINIC_OR_DEPARTMENT_OTHER)
Admission: RE | Admit: 2016-07-05 | Discharge: 2016-07-05 | Disposition: A | Discharge status: Home / Self Care | Payer: PPO | Source: Ambulatory Visit | Attending: General Surgery | Admitting: General Surgery

## 2016-07-05 ENCOUNTER — Ambulatory Visit
Admission: RE | Admit: 2016-07-05 | Discharge: 2016-07-05 | Disposition: A | Discharge status: Home / Self Care | Payer: PPO | Source: Ambulatory Visit | Attending: General Surgery | Admitting: General Surgery

## 2016-07-05 ENCOUNTER — Other Ambulatory Visit: Payer: Self-pay | Admitting: General Surgery

## 2016-07-05 DIAGNOSIS — C50911 Malignant neoplasm of unspecified site of right female breast: Secondary | ICD-10-CM

## 2016-07-05 NOTE — Progress Notes (Signed)
Bottled water given with instructions to complete by [facility], pt verbalized understanding.

## 2016-07-06 ENCOUNTER — Encounter (HOSPITAL_BASED_OUTPATIENT_CLINIC_OR_DEPARTMENT_OTHER): Payer: Self-pay

## 2016-07-06 ENCOUNTER — Ambulatory Visit (HOSPITAL_BASED_OUTPATIENT_CLINIC_OR_DEPARTMENT_OTHER): Payer: PPO | Admitting: Certified Registered Nurse Anesthetist

## 2016-07-06 ENCOUNTER — Ambulatory Visit (HOSPITAL_COMMUNITY)
Admission: RE | Admit: 2016-07-06 | Discharge: 2016-07-06 | Disposition: A | Discharge status: Home / Self Care | Payer: PPO | Source: Ambulatory Visit | Attending: General Surgery | Admitting: General Surgery

## 2016-07-06 ENCOUNTER — Ambulatory Visit
Admission: RE | Admit: 2016-07-06 | Discharge: 2016-07-06 | Disposition: A | Discharge status: Home / Self Care | Payer: PPO | Source: Ambulatory Visit | Attending: General Surgery | Admitting: General Surgery

## 2016-07-06 ENCOUNTER — Telehealth: Payer: Self-pay | Admitting: Hematology and Oncology

## 2016-07-06 ENCOUNTER — Encounter (HOSPITAL_BASED_OUTPATIENT_CLINIC_OR_DEPARTMENT_OTHER)
Admission: RE | Disposition: A | Discharge status: Home / Self Care | Payer: Self-pay | Source: Ambulatory Visit | Attending: General Surgery

## 2016-07-06 ENCOUNTER — Ambulatory Visit (HOSPITAL_BASED_OUTPATIENT_CLINIC_OR_DEPARTMENT_OTHER)
Admission: RE | Admit: 2016-07-06 | Discharge: 2016-07-06 | Disposition: A | Discharge status: Home / Self Care | Payer: PPO | Source: Ambulatory Visit | Attending: General Surgery | Admitting: General Surgery

## 2016-07-06 DIAGNOSIS — Z7984 Long term (current) use of oral hypoglycemic drugs: Secondary | ICD-10-CM | POA: Insufficient documentation

## 2016-07-06 DIAGNOSIS — C50911 Malignant neoplasm of unspecified site of right female breast: Secondary | ICD-10-CM

## 2016-07-06 DIAGNOSIS — Z780 Asymptomatic menopausal state: Secondary | ICD-10-CM | POA: Insufficient documentation

## 2016-07-06 DIAGNOSIS — Z170 Estrogen receptor positive status [ER+]: Secondary | ICD-10-CM | POA: Insufficient documentation

## 2016-07-06 DIAGNOSIS — I10 Essential (primary) hypertension: Secondary | ICD-10-CM | POA: Diagnosis not present

## 2016-07-06 DIAGNOSIS — J449 Chronic obstructive pulmonary disease, unspecified: Secondary | ICD-10-CM | POA: Insufficient documentation

## 2016-07-06 DIAGNOSIS — E7800 Pure hypercholesterolemia, unspecified: Secondary | ICD-10-CM | POA: Insufficient documentation

## 2016-07-06 DIAGNOSIS — F419 Anxiety disorder, unspecified: Secondary | ICD-10-CM | POA: Insufficient documentation

## 2016-07-06 DIAGNOSIS — M1990 Unspecified osteoarthritis, unspecified site: Secondary | ICD-10-CM | POA: Insufficient documentation

## 2016-07-06 DIAGNOSIS — G8918 Other acute postprocedural pain: Secondary | ICD-10-CM | POA: Diagnosis not present

## 2016-07-06 DIAGNOSIS — Z79899 Other long term (current) drug therapy: Secondary | ICD-10-CM | POA: Insufficient documentation

## 2016-07-06 DIAGNOSIS — Z6832 Body mass index (BMI) 32.0-32.9, adult: Secondary | ICD-10-CM | POA: Diagnosis not present

## 2016-07-06 DIAGNOSIS — F17200 Nicotine dependence, unspecified, uncomplicated: Secondary | ICD-10-CM | POA: Insufficient documentation

## 2016-07-06 DIAGNOSIS — C50511 Malignant neoplasm of lower-outer quadrant of right female breast: Secondary | ICD-10-CM | POA: Diagnosis not present

## 2016-07-06 DIAGNOSIS — E119 Type 2 diabetes mellitus without complications: Secondary | ICD-10-CM | POA: Insufficient documentation

## 2016-07-06 DIAGNOSIS — N6011 Diffuse cystic mastopathy of right breast: Secondary | ICD-10-CM | POA: Diagnosis not present

## 2016-07-06 HISTORY — PX: BREAST LUMPECTOMY WITH RADIOACTIVE SEED AND SENTINEL LYMPH NODE BIOPSY: SHX6550

## 2016-07-06 LAB — GLUCOSE, CAPILLARY: GLUCOSE-CAPILLARY: 136 mg/dL — AB (ref 65–99)

## 2016-07-06 SURGERY — BREAST LUMPECTOMY WITH RADIOACTIVE SEED AND SENTINEL LYMPH NODE BIOPSY
Anesthesia: General | Site: Breast | Laterality: Right

## 2016-07-06 MED ORDER — OXYCODONE HCL 5 MG PO TABS
ORAL_TABLET | ORAL | Status: AC
  Filled 2016-07-06: qty 1

## 2016-07-06 MED ORDER — FENTANYL CITRATE (PF) 100 MCG/2ML IJ SOLN
50.0000 ug | INTRAMUSCULAR | Status: AC | PRN
  Administered 2016-07-06: 25 ug via INTRAVENOUS
  Administered 2016-07-06: 50 ug via INTRAVENOUS
  Administered 2016-07-06 (×2): 25 ug via INTRAVENOUS
  Administered 2016-07-06: 50 ug via INTRAVENOUS
  Administered 2016-07-06: 25 ug via INTRAVENOUS

## 2016-07-06 MED ORDER — BUPIVACAINE-EPINEPHRINE (PF) 0.5% -1:200000 IJ SOLN
INTRAMUSCULAR | Status: DC | PRN
  Administered 2016-07-06: 14 mL

## 2016-07-06 MED ORDER — LIDOCAINE 2% (20 MG/ML) 5 ML SYRINGE
INTRAMUSCULAR | Status: DC | PRN
  Administered 2016-07-06: 60 mg via INTRAVENOUS

## 2016-07-06 MED ORDER — EPHEDRINE SULFATE-NACL 50-0.9 MG/10ML-% IV SOSY
PREFILLED_SYRINGE | INTRAVENOUS | Status: DC | PRN
Start: 2016-07-06 — End: 2016-07-06
  Administered 2016-07-06 (×3): 10 mg via INTRAVENOUS
  Administered 2016-07-06: 15 mg via INTRAVENOUS

## 2016-07-06 MED ORDER — LIDOCAINE 2% (20 MG/ML) 5 ML SYRINGE
INTRAMUSCULAR | Status: AC
  Filled 2016-07-06: qty 5

## 2016-07-06 MED ORDER — METHYLENE BLUE 0.5 % INJ SOLN
INTRAVENOUS | Status: AC
  Filled 2016-07-06: qty 10

## 2016-07-06 MED ORDER — BUPIVACAINE-EPINEPHRINE (PF) 0.5% -1:200000 IJ SOLN
INTRAMUSCULAR | Status: AC
  Filled 2016-07-06: qty 60

## 2016-07-06 MED ORDER — SODIUM CHLORIDE 0.9 % IJ SOLN
INTRAMUSCULAR | Status: AC
  Filled 2016-07-06: qty 10

## 2016-07-06 MED ORDER — DEXAMETHASONE SODIUM PHOSPHATE 10 MG/ML IJ SOLN
INTRAMUSCULAR | Status: AC
  Filled 2016-07-06: qty 1

## 2016-07-06 MED ORDER — SCOPOLAMINE 1 MG/3DAYS TD PT72: 1.0000 | MEDICATED_PATCH | Freq: Once | TRANSDERMAL | Status: DC | PRN

## 2016-07-06 MED ORDER — CHLORHEXIDINE GLUCONATE CLOTH 2 % EX PADS: 6.0000 | MEDICATED_PAD | Freq: Once | CUTANEOUS | Status: DC

## 2016-07-06 MED ORDER — MEPERIDINE HCL 25 MG/ML IJ SOLN: 6.2500 mg | INTRAMUSCULAR | Status: DC | PRN

## 2016-07-06 MED ORDER — TECHNETIUM TC 99M SULFUR COLLOID FILTERED
1.0000 | Freq: Once | INTRAVENOUS | Status: AC | PRN
  Administered 2016-07-06: 1 via INTRADERMAL

## 2016-07-06 MED ORDER — LACTATED RINGERS IV SOLN
INTRAVENOUS | Status: DC
  Administered 2016-07-06 (×2): via INTRAVENOUS

## 2016-07-06 MED ORDER — FENTANYL CITRATE (PF) 100 MCG/2ML IJ SOLN
INTRAMUSCULAR | Status: AC
  Filled 2016-07-06: qty 2

## 2016-07-06 MED ORDER — ONDANSETRON HCL 4 MG/2ML IJ SOLN
INTRAMUSCULAR | Status: DC | PRN
  Administered 2016-07-06: 4 mg via INTRAVENOUS

## 2016-07-06 MED ORDER — CIPROFLOXACIN IN D5W 400 MG/200ML IV SOLN
400.0000 mg | INTRAVENOUS | Status: AC
  Administered 2016-07-06: 400 mg via INTRAVENOUS

## 2016-07-06 MED ORDER — FENTANYL CITRATE (PF) 100 MCG/2ML IJ SOLN
25.0000 ug | INTRAMUSCULAR | Status: DC | PRN
  Administered 2016-07-06 (×2): 50 ug via INTRAVENOUS

## 2016-07-06 MED ORDER — ONDANSETRON HCL 4 MG/2ML IJ SOLN
INTRAMUSCULAR | Status: AC
  Filled 2016-07-06: qty 2

## 2016-07-06 MED ORDER — CIPROFLOXACIN IN D5W 400 MG/200ML IV SOLN
INTRAVENOUS | Status: DC | PRN
  Administered 2016-07-06: 400 mg via INTRAVENOUS

## 2016-07-06 MED ORDER — PROPOFOL 500 MG/50ML IV EMUL
INTRAVENOUS | Status: AC
  Filled 2016-07-06: qty 50

## 2016-07-06 MED ORDER — DEXAMETHASONE SODIUM PHOSPHATE 4 MG/ML IJ SOLN
INTRAMUSCULAR | Status: DC | PRN
  Administered 2016-07-06: 5 mg via INTRAVENOUS

## 2016-07-06 MED ORDER — OXYCODONE HCL 5 MG PO TABS
5.0000 mg | ORAL_TABLET | Freq: Once | ORAL | Status: AC
  Administered 2016-07-06: 5 mg via ORAL

## 2016-07-06 MED ORDER — ACETAMINOPHEN 500 MG PO TABS
ORAL_TABLET | ORAL | Status: AC
  Filled 2016-07-06: qty 2

## 2016-07-06 MED ORDER — PROMETHAZINE HCL 25 MG/ML IJ SOLN: 6.2500 mg | INTRAMUSCULAR | Status: DC | PRN

## 2016-07-06 MED ORDER — PROPOFOL 10 MG/ML IV BOLUS
INTRAVENOUS | Status: DC | PRN
  Administered 2016-07-06: 150 mg via INTRAVENOUS

## 2016-07-06 MED ORDER — OXYCODONE-ACETAMINOPHEN 10-325 MG PO TABS: 1.0000 | ORAL_TABLET | ORAL | 0 refills | Status: DC | PRN

## 2016-07-06 MED ORDER — ACETAMINOPHEN 500 MG PO TABS
1000.0000 mg | ORAL_TABLET | ORAL | Status: AC
  Administered 2016-07-06: 1000 mg via ORAL

## 2016-07-06 MED ORDER — BUPIVACAINE-EPINEPHRINE (PF) 0.5% -1:200000 IJ SOLN
INTRAMUSCULAR | Status: DC | PRN
  Administered 2016-07-06: 30 mL

## 2016-07-06 MED ORDER — MIDAZOLAM HCL 2 MG/2ML IJ SOLN
1.0000 mg | INTRAMUSCULAR | Status: DC | PRN
  Administered 2016-07-06 (×2): 1 mg via INTRAVENOUS

## 2016-07-06 MED ORDER — GABAPENTIN 300 MG PO CAPS
ORAL_CAPSULE | ORAL | Status: AC
  Filled 2016-07-06: qty 1

## 2016-07-06 MED ORDER — CELECOXIB 200 MG PO CAPS
ORAL_CAPSULE | ORAL | Status: AC
  Filled 2016-07-06: qty 2

## 2016-07-06 MED ORDER — MIDAZOLAM HCL 2 MG/2ML IJ SOLN: 0.5000 mg | Freq: Once | INTRAMUSCULAR | Status: DC | PRN

## 2016-07-06 MED ORDER — MIDAZOLAM HCL 2 MG/2ML IJ SOLN
INTRAMUSCULAR | Status: AC
  Filled 2016-07-06: qty 2

## 2016-07-06 MED ORDER — EPHEDRINE 5 MG/ML INJ
INTRAVENOUS | Status: AC
  Filled 2016-07-06: qty 10

## 2016-07-06 MED ORDER — CELECOXIB 400 MG PO CAPS
400.0000 mg | ORAL_CAPSULE | ORAL | Status: AC
  Administered 2016-07-06: 400 mg via ORAL

## 2016-07-06 MED ORDER — SODIUM CHLORIDE 0.9 % IJ SOLN
INTRAVENOUS | Status: DC | PRN
  Administered 2016-07-06: 5 mL via INTRADERMAL

## 2016-07-06 MED ORDER — GABAPENTIN 300 MG PO CAPS
300.0000 mg | ORAL_CAPSULE | ORAL | Status: AC
  Administered 2016-07-06: 300 mg via ORAL

## 2016-07-06 MED ORDER — CIPROFLOXACIN IN D5W 400 MG/200ML IV SOLN
INTRAVENOUS | Status: AC
  Filled 2016-07-06: qty 200

## 2016-07-06 SURGICAL SUPPLY — 57 items
ADH SKN CLS APL DERMABOND .7 (GAUZE/BANDAGES/DRESSINGS) ×1
BINDER BREAST LRG (GAUZE/BANDAGES/DRESSINGS) IMPLANT
BINDER BREAST MEDIUM (GAUZE/BANDAGES/DRESSINGS) IMPLANT
BINDER BREAST XLRG (GAUZE/BANDAGES/DRESSINGS) ×2 IMPLANT
BINDER BREAST XXLRG (GAUZE/BANDAGES/DRESSINGS) IMPLANT
BLADE SURG 15 STRL LF DISP TIS (BLADE) ×1 IMPLANT
BLADE SURG 15 STRL SS (BLADE) ×3
CANISTER SUC SOCK COL 7IN (MISCELLANEOUS) IMPLANT
CANISTER SUCT 1200ML W/VALVE (MISCELLANEOUS) ×2 IMPLANT
CHLORAPREP W/TINT 26ML (MISCELLANEOUS) ×5 IMPLANT
CLIP TI WIDE RED SMALL 6 (CLIP) ×2 IMPLANT
COVER BACK TABLE 60X90IN (DRAPES) ×3 IMPLANT
COVER MAYO STAND STRL (DRAPES) ×3 IMPLANT
COVER PROBE W GEL 5X96 (DRAPES) ×3 IMPLANT
DECANTER SPIKE VIAL GLASS SM (MISCELLANEOUS) IMPLANT
DERMABOND ADVANCED (GAUZE/BANDAGES/DRESSINGS) ×2
DERMABOND ADVANCED .7 DNX12 (GAUZE/BANDAGES/DRESSINGS) ×1 IMPLANT
DEVICE DUBIN W/COMP PLATE 8390 (MISCELLANEOUS) ×3 IMPLANT
DRAPE LAPAROSCOPIC ABDOMINAL (DRAPES) ×3 IMPLANT
DRAPE UTILITY XL STRL (DRAPES) ×3 IMPLANT
ELECT COATED BLADE 2.86 ST (ELECTRODE) ×3 IMPLANT
ELECT REM PT RETURN 9FT ADLT (ELECTROSURGICAL) ×3
ELECTRODE REM PT RTRN 9FT ADLT (ELECTROSURGICAL) ×1 IMPLANT
GLOVE BIOGEL PI IND STRL 7.0 (GLOVE) IMPLANT
GLOVE BIOGEL PI IND STRL 7.5 (GLOVE) IMPLANT
GLOVE BIOGEL PI IND STRL 8 (GLOVE) ×1 IMPLANT
GLOVE BIOGEL PI INDICATOR 7.0 (GLOVE) ×4
GLOVE BIOGEL PI INDICATOR 7.5 (GLOVE) ×2
GLOVE BIOGEL PI INDICATOR 8 (GLOVE) ×2
GLOVE ECLIPSE 6.5 STRL STRAW (GLOVE) ×2 IMPLANT
GLOVE ECLIPSE 7.5 STRL STRAW (GLOVE) ×7 IMPLANT
GOWN STRL REUS W/ TWL LRG LVL3 (GOWN DISPOSABLE) ×1 IMPLANT
GOWN STRL REUS W/ TWL XL LVL3 (GOWN DISPOSABLE) ×1 IMPLANT
GOWN STRL REUS W/TWL LRG LVL3 (GOWN DISPOSABLE) ×3
GOWN STRL REUS W/TWL XL LVL3 (GOWN DISPOSABLE) ×6
ILLUMINATOR WAVEGUIDE N/F (MISCELLANEOUS) ×2 IMPLANT
KIT MARKER MARGIN INK (KITS) ×3 IMPLANT
NDL FILTER BLUNT 18X1 1/2 (NEEDLE) IMPLANT
NDL HYPO 25X1 1.5 SAFETY (NEEDLE) ×2 IMPLANT
NDL SAFETY ECLIPSE 18X1.5 (NEEDLE) ×1 IMPLANT
NEEDLE FILTER BLUNT 18X 1/2SAF (NEEDLE) ×2
NEEDLE FILTER BLUNT 18X1 1/2 (NEEDLE) ×1 IMPLANT
NEEDLE HYPO 18GX1.5 SHARP (NEEDLE) ×3
NEEDLE HYPO 25X1 1.5 SAFETY (NEEDLE) ×6 IMPLANT
NS IRRIG 1000ML POUR BTL (IV SOLUTION) ×2 IMPLANT
PACK BASIN DAY SURGERY FS (CUSTOM PROCEDURE TRAY) ×3 IMPLANT
PENCIL BUTTON HOLSTER BLD 10FT (ELECTRODE) ×3 IMPLANT
SLEEVE SCD COMPRESS KNEE MED (MISCELLANEOUS) ×3 IMPLANT
SPONGE LAP 4X18 X RAY DECT (DISPOSABLE) ×3 IMPLANT
SUT MON AB 5-0 PS2 18 (SUTURE) ×3 IMPLANT
SUT VICRYL 3-0 CR8 SH (SUTURE) ×3 IMPLANT
SYR CONTROL 10ML LL (SYRINGE) ×6 IMPLANT
TOWEL OR 17X24 6PK STRL BLUE (TOWEL DISPOSABLE) ×3 IMPLANT
TOWEL OR NON WOVEN STRL DISP B (DISPOSABLE) ×1 IMPLANT
TUBE CONNECTING 20'X1/4 (TUBING) ×1
TUBE CONNECTING 20X1/4 (TUBING) ×1 IMPLANT
YANKAUER SUCT BULB TIP NO VENT (SUCTIONS) ×2 IMPLANT

## 2016-07-06 NOTE — Anesthesia Procedure Notes (Signed)
Anesthesia Regional Block: Pectoralis block   Pre-Anesthetic Checklist: ,, timeout performed, Correct Patient, Correct Site, Correct Laterality, Correct Procedure, Correct Position, site marked, Risks and benefits discussed,  Surgical consent,  Pre-op evaluation,  At surgeon's request and post-op pain management  Laterality: Right  Prep: chloraprep       Needles:   Needle Type: Echogenic Needle     Needle Length: 9cm  Needle Gauge: 21     Additional Needles:   Procedures: ultrasound guided,,,,,,,,  Narrative:  Start time: 07/06/2016 10:07 AM End time: 07/06/2016 10:13 AM Injection made incrementally with aspirations every 5 mL.  Performed by: Personally  Anesthesiologist: Glennon Mac, Shenice Dolder  Additional Notes: Pt identified in Holding room.  Monitors applied. Working IV access confirmed. Sterile prep, drape R clavicle and pec.  #21 ga ECHOgenic needle between pec and serratus, ribs, 4,5 with US guidance.  30cc 0.5% Bupivacaine with 1:200k epi injected incrementally after negative test dose, good spread of local across ribs.  Patient asymptomatic, VSS, no heme aspirated, tolerated well.  Jenita Seashore, MD

## 2016-07-06 NOTE — Interval H&P Note (Signed)
History and Physical Interval Note:  07/06/2016 8:37 AM  Tabitha [name]  has presented today for surgery, with the diagnosis of RIGHT BREAST CANCER  The various methods of treatment have been discussed with the patient and family. After consideration of risks, benefits and other options for treatment, the patient has consented to  Procedure(s): RIGHT BREAST LUMPECTOMY WITH RADIOACTIVE SEED AND AXILLARY SENTINEL LYMPH NODE BIOPSY (Right) as a surgical intervention .  The patient's history has been reviewed, patient examined, no change in status, stable for surgery.  I have reviewed the patient's chart and labs.  Questions were answered to the patient's satisfaction.   Note: 3 cm hematoma noted at Bx site at time of seed placement so after discussion with radiology we decided on wire localization to avoid chance of seed displacement   Tabitha [name] T

## 2016-07-06 NOTE — Progress Notes (Signed)
Assisted Dr. Annye Asa with right, ultrasound guided, pectoralis block. Side rails up, monitors on throughout procedure. See vital signs in flow sheet. Tolerated Procedure well.

## 2016-07-06 NOTE — Anesthesia Postprocedure Evaluation (Signed)
Anesthesia Post Note  Patient: Frankey Shown Sholl  Procedure(s) Performed: Procedure(s) (LRB): RIGHT BREAST LUMPECTOMY WITH RADIOACTIVE SEED AND AXILLARY SENTINEL LYMPH NODE BIOPSY (Right)  Patient location during evaluation: PACU Anesthesia Type: General Level of consciousness: awake and alert Pain management: pain level controlled Vital Signs Assessment: post-procedure vital signs reviewed and stable Respiratory status: spontaneous breathing, nonlabored ventilation, respiratory function stable and patient connected to nasal cannula oxygen Cardiovascular status: blood pressure returned to baseline and stable Postop Assessment: no signs of nausea or vomiting Anesthetic complications: no       Last Vitals:  Vitals:   07/06/16 1330 07/06/16 1348  BP: 126/64 140/76  Pulse: 77 93  Resp: 12 18  Temp:  36.7 C    Last Pain:  Vitals:   07/06/16 1357  TempSrc:   PainSc: Adin

## 2016-07-06 NOTE — Telephone Encounter (Signed)
lvm to inform pt of 6/8 appt per sch msg

## 2016-07-06 NOTE — Transfer of Care (Signed)
Immediate Anesthesia Transfer of Care Note  Patient: Tabitha [name]  Procedure(s) Performed: Procedure(s): RIGHT BREAST LUMPECTOMY WITH RADIOACTIVE SEED AND AXILLARY SENTINEL LYMPH NODE BIOPSY (Right)  Patient Location: PACU  Anesthesia Type:General  Level of Consciousness: drowsy and patient cooperative  Airway & Oxygen Therapy: Patient Spontanous Breathing and Patient connected to face mask oxygen  Post-op Assessment: Report given to RN and Post -op Vital signs reviewed and stable  Post vital signs: Reviewed and stable  Last Vitals:  Vitals:   07/06/16 1005 07/06/16 1010  BP:    Pulse: 64 (!) 58  Resp: 14 13  Temp:      Last Pain:  Vitals:   07/06/16 0843  TempSrc: Oral  PainSc: 4          Complications: No apparent anesthesia complications

## 2016-07-06 NOTE — Anesthesia Preprocedure Evaluation (Addendum)
Anesthesia Evaluation  Patient identified by MRN, date of birth, ID band Patient awake    Reviewed: Allergy & Precautions, NPO status , Patient's Chart, lab work & pertinent test results  History of Anesthesia Complications Negative for: history of anesthetic complications  Airway Mallampati: I  TM Distance: >3 FB Neck ROM: Full    Dental  (+) Edentulous Upper, Edentulous Lower   Pulmonary COPD, Current Smoker,    breath sounds clear to auscultation       Cardiovascular hypertension, Pt. on medications (-) angina Rhythm:Regular Rate:Normal     Neuro/Psych negative neurological ROS     GI/Hepatic negative GI ROS, Neg liver ROS, Chronic pancreatitis   Endo/Other  diabetes (glu 136), Oral Hypoglycemic AgentsMorbid obesity  Renal/GU negative Renal ROS     Musculoskeletal   Abdominal (+) + obese,   Peds  Hematology negative hematology ROS (+)   Anesthesia Other Findings Breast cancer  Reproductive/Obstetrics                            Anesthesia Physical Anesthesia Plan  ASA: II  Anesthesia Plan: General   Post-op Pain Management: GA combined w/ Regional for post-op pain   Induction: Intravenous  Airway Management Planned: LMA  Additional Equipment:   Intra-op Plan:   Post-operative Plan:   Informed Consent: I have reviewed the patients History and Physical, chart, labs and discussed the procedure including the risks, benefits and alternatives for the proposed anesthesia with the patient or authorized representative who has indicated his/her understanding and acceptance.     Plan Discussed with: CRNA and Surgeon  Anesthesia Plan Comments: (Plan routine monitors, GA- LMA OK, pectoralis block for post op analgesia)        Anesthesia Quick Evaluation

## 2016-07-06 NOTE — Op Note (Signed)
Preoperative Diagnosis: RIGHT BREAST CANCER  Postoprative Diagnosis: RIGHT BREAST CANCER  Procedure: Procedure(s): Blue dye injection right breast, RIGHT BREAST LUMPECTOMY WITH RADIOACTIVE SEED AND DEEP AXILLARY SENTINEL LYMPH NODE BIOPSY   Surgeon: Excell Seltzer T   Assistants: None  Anesthesia:  General LMA anesthesia  Indications: Patient is a 61 year old female with a recent diagnosis of stage I hormone positive invasive ductal carcinoma of the inferior right breast. Tumor is seen only on mammogram and measures 0.7 cm. After initial consultation and discussion regarding the procedure and alternatives and risks detailed elsewhere we elected to proceed with radioactive seed localized right breast lumpectomy and axillary sentinel lymph node biopsy has initial surgical treatment.  At presentation for seed placement yesterday there was noted to be an approximately 3 cm hematoma at the biopsy site and patient was not felt to be a good candidate for radioactive seed placement due to possible displacement of the seed by the hematoma and we elected to use wire localization instead after consultation with radiology.    Procedure Detail:  Preoperatively the patient underwent wire localization of the small mass in the inferior left breast with the wire placed fairly remotely through a marked medial location due to limited visibility of the mass on other views. It was noted that the biopsy clip was displaced by the hematoma and was quite remote from the visible tumor several centimeters away. In the holding area she underwent injection of 1 mCi of technetium sulfur colloid intradermally around the right nipple and also underwent a pectoral block by anesthesia. She was taken to the operating room, placed in the supine position on the operating table, and general laryngeal mask anesthesia induced. Based on the node location of the tumor in the inferior breast I made a circumareolar curvilinear incision  inferiorly at the site of a previous breast reduction incision. Dissection was then deepened down through the breast tissue and the wire was encountered deep in the breast tissue at about midline. The wire was then brought into the incision. I had not yet encountered the thickened shaft of the wire which was felt to be adjacent to the small tumor. This was very deep in the breast. I dissected the wire a little more laterally and then widened the dissection out around the shaft and the tip of the wire excising a fairly generous, approximately 4 cm specimen around the shaft and tip of the wire. The hematoma from the previous biopsy site was palpable inferior to this and was not entered. The specimen was removed and inked for margins. Specimen x-ray showed the wire intact and centrally located within the specimen and the small mass was visible adjacent to the wire. It appeared fairly well contained but somewhat closer to the superior margin and I therefore excised an additional approximate 5 mm complete superior margin which was also inked and oriented and sent for permanent pathology.  Attention was turned to the sentinel lymph node biopsy. Initially I had not been able to detect much of the wave counts in the axilla prior to starting the procedure and I had injected 5 mL of dilute methylene blue subcutaneous and beneath the right nipple and massaged this for several minutes.  At this point I was able to localize some increased counts in the axilla and a small transverse incision was made at this point. Dissection was carried down through the subcutaneous tissue and the clavipectoral fascia incised. I was able to feel a soft slightly enlarged lymph node. This corresponded to  the elevated counts. Dissection was carried down onto the lymph node which also had bright blue dye. This was completely excised with cautery. Ex vivo the node had counts of about 200. This was sent as hot blue axillary sentinel lymph node. At  this point I could not detect any further counts in the axilla. There was no palpable adenopathy and no further visible blue dye. Hemostasis was assured. The skin incisions were infiltrated with local anesthesia. The deep axillary and breast tissue was closed with interrupted 3-0 Vicryl. The lumpectomy cavity and the breast had been marked with clips. Subcutaneous tissue was closed with interrupted 3-0 Vicryl and skin with running subcuticular 4-0 Monocryl. Sponge needle and instrument counts were correct.   Findings: As above  Estimated Blood Loss:  Minimal         Drains: None  Blood Given: none          Specimens: #1 right breast lumpectomy #2 further superior margin right breast oriented #3 hot blue right axillary sentinel lymph node        Complications:  * No complications entered in OR log *         Disposition: PACU - hemodynamically stable.         Condition: stable

## 2016-07-06 NOTE — Discharge Instructions (Signed)
[facility] °Office Phone Number [phone] ° °BREAST BIOPSY/ PARTIAL MASTECTOMY: POST OP INSTRUCTIONS ° °Always review your discharge instruction sheet given to you by the facility where your surgery was performed. ° °IF YOU HAVE DISABILITY OR FAMILY LEAVE FORMS, YOU MUST BRING THEM TO THE OFFICE FOR PROCESSING.  DO NOT GIVE THEM TO YOUR DOCTOR. ° °1. A prescription for pain medication may be given to you upon discharge.  Take your pain medication as prescribed, if needed.  If narcotic pain medicine is not needed, then you may take acetaminophen (Tylenol) or ibuprofen (Advil) as needed. °2. Take your usually prescribed medications unless otherwise directed °3. If you need a refill on your pain medication, please contact your pharmacy.  They will contact our office to request authorization.  Prescriptions will not be filled after 5pm or on week-ends. °4. You should eat very light the first 24 hours after surgery, such as soup, crackers, pudding, etc.  Resume your normal diet the day after surgery. °5. Most patients will experience some swelling and bruising in the breast.  Ice packs and a good support bra will help.  Swelling and bruising can take several days to resolve.  °6. It is common to experience some constipation if taking pain medication after surgery.  Increasing fluid intake and taking a stool softener will usually help or prevent this problem from occurring.  A mild laxative (Milk of Magnesia or Miralax) should be taken according to package directions if there are no bowel movements after 48 hours. °7. Unless discharge instructions indicate otherwise, you may remove your bandages 24-48 hours after surgery, and you may shower at that time.  You may have steri-strips (small skin tapes) in place directly over the incision.  These strips should be left on the skin for 7-10 days.  If your surgeon used skin glue on the incision, you may shower in 24 hours.  The glue will flake off over the  next 2-3 weeks.  Any sutures or staples will be removed at the office during your follow-up visit. °8. ACTIVITIES:  You may resume regular daily activities (gradually increasing) beginning the next day.  Wearing a good support bra or sports bra minimizes pain and swelling.  You may have sexual intercourse when it is comfortable. °a. You may drive when you no longer are taking prescription pain medication, you can comfortably wear a seatbelt, and you can safely maneuver your car and apply brakes. °b. RETURN TO WORK:  ______________________________________________________________________________________ °9. You should see your doctor in the office for a follow-up appointment approximately two weeks after your surgery.  Your doctor’s nurse will typically make your follow-up appointment when she calls you with your pathology report.  Expect your pathology report 2-3 business days after your surgery.  You may call to check if you do not hear from us after three days. °10. OTHER INSTRUCTIONS: _______________________________________________________________________________________________ _____________________________________________________________________________________________________________________________________ °_____________________________________________________________________________________________________________________________________ °_____________________________________________________________________________________________________________________________________ ° °WHEN TO CALL YOUR DOCTOR: °1. Fever over 101.0 °2. Nausea and/or vomiting. °3. Extreme swelling or bruising. °4. Continued bleeding from incision. °5. Increased pain, redness, or drainage from the incision. ° °The clinic staff is available to answer your questions during regular business hours.  Please don’t hesitate to call and ask to speak to one of the nurses for clinical concerns.  If you have a medical emergency, go to the nearest  emergency room or call 911.  A surgeon from [facility] is always on call at the hospital. ° °For further questions, please visit [email or web address]  ° ° ° ° °  Post Anesthesia Home Care Instructions ° °Activity: °Get plenty of rest for the remainder of the day. A responsible individual must stay with you for 24 hours following the procedure.  °For the next 24 hours, DO NOT: °-Drive a car °-Operate machinery °-Drink alcoholic beverages °-Take any medication unless instructed by your physician °-Make any legal decisions or sign important papers. ° °Meals: °Start with liquid foods such as gelatin or soup. Progress to regular foods as tolerated. Avoid greasy, spicy, heavy foods. If nausea and/or vomiting occur, drink only clear liquids until the nausea and/or vomiting subsides. Call your physician if vomiting continues. ° °Special Instructions/Symptoms: °Your throat may feel dry or sore from the anesthesia or the breathing tube placed in your throat during surgery. If this causes discomfort, gargle with warm salt water. The discomfort should disappear within 24 hours. ° °If you had a scopolamine patch placed behind your ear for the management of post- operative nausea and/or vomiting: ° °1. The medication in the patch is effective for 72 hours, after which it should be removed.  Wrap patch in a tissue and discard in the trash. Wash hands thoroughly with soap and water. °2. You may remove the patch earlier than 72 hours if you experience unpleasant side effects which may include dry mouth, dizziness or visual disturbances. °3. Avoid touching the patch. Wash your hands with soap and water after contact with the patch. °  ° °

## 2016-07-06 NOTE — Anesthesia Procedure Notes (Signed)
Procedure Name: LMA Insertion Date/Time: 07/06/2016 11:00 AM Performed by: Genelle Bal Pre-anesthesia Checklist: Patient identified, Emergency Drugs available, Suction available and Patient being monitored Patient Re-evaluated:Patient Re-evaluated prior to inductionOxygen Delivery Method: Circle system utilized Preoxygenation: Pre-oxygenation with 100% oxygen Intubation Type: IV induction Ventilation: Mask ventilation without difficulty LMA: LMA inserted LMA Size: 4.0 Number of attempts: 1 Airway Equipment and Method: Bite block Placement Confirmation: positive ETCO2 Tube secured with: Tape Dental Injury: Teeth and Oropharynx as per pre-operative assessment

## 2016-07-06 NOTE — Anesthesia Procedure Notes (Signed)
Procedures

## 2016-07-06 NOTE — H&P (Signed)
History of Present Illness Tabitha [name] T. Kelliann Pendergraph MD; 06/15/2016 11:01 AM) The patient is a 61 year old female who presents with breast cancer. She is a post menopausal female referred by Dr. Malka So for evaluation of recently diagnosed carcinoma of the right breast. She recently presented for a screening mamogram revealing possible asymmetry in the right breast. Subsequent imaging included diagnostic mamogram showing a 7 mm irregular mass in the inferior right breast seen on the MLO view only. Ultrasound was performed showing no ultrasound correlate and no apparent adenopathy. A stereotactic guided biopsywas performed on 06/08/2016 with pathology revealing invasive ductal carcinoma of the breast. She is seen now in breast multidisciplinary clinic for initial treatment planning. She has experienced no breast symptoms, specifically lump, skin changes or nipple discharge. Findings at that time were the following:  Tumor size: 0.7 cm Tumor grade: 2, Ki-67 10% Estrogen Receptor: +100% Progesterone Receptor: +95% Her-2 neu: negative Lymph node status: negative    Past Surgical History Tawni Pummel, RN; 06/15/2016 7:49 AM) Appendectomy  Breast Biopsy  Right. Gallbladder Surgery - Laparoscopic  Hysterectomy (not due to cancer) - Partial  Mammoplasty; Reduction  Bilateral. Shoulder Surgery  Right. Spinal Surgery Midback   Diagnostic Studies History Tawni Pummel, RN; 06/15/2016 7:49 AM) Colonoscopy  1-5 years ago Mammogram  within last year Pap Smear  1-5 years ago  Medication History Tawni Pummel, RN; 06/15/2016 7:50 AM) Medications Reconciled  Social History Tawni Pummel, RN; 06/15/2016 7:49 AM) Caffeine use  Carbonated beverages, Coffee. No alcohol use  No drug use  Tobacco use  Current every day smoker.  Family History Tawni Pummel, RN; 06/15/2016 7:49 AM) Alcohol Abuse  Father, Mother. Arthritis  Father. Breast Cancer  Family Members In  General. Diabetes Mellitus  Family Members In General. Heart Disease  Family Members In General. Migraine Headache  Sister. Prostate Cancer  Father. Thyroid problems  Daughter.  Pregnancy / Birth History Tawni Pummel, RN; 06/15/2016 7:49 AM) Age at menarche  74 years. Age of menopause  51-55 Contraceptive History  Oral contraceptives. Gravida  1 Maternal age  64-30 Para  1  Other Problems Tawni Pummel, RN; 06/15/2016 7:49 AM) Anxiety Disorder  Arthritis  Back Pain  Bladder Problems  Diabetes Mellitus  Hemorrhoids  High blood pressure  Hypercholesterolemia  Kidney Stone  Lump In Breast  Pancreatitis     Review of Systems Sunday Spillers Ledford RN; 06/15/2016 7:49 AM) General Not Present- Appetite Loss, Chills, Fatigue, Fever, Night Sweats, Weight Gain and Weight Loss. Skin Not Present- Change in Wart/Mole, Dryness, Hives, Jaundice, New Lesions, Non-Healing Wounds, Rash and Ulcer. HEENT Present- Seasonal Allergies and Wears glasses/contact lenses. Not Present- Earache, Hearing Loss, Hoarseness, Nose Bleed, Oral Ulcers, Ringing in the Ears, Sinus Pain, Sore Throat, Visual Disturbances and Yellow Eyes. Respiratory Present- Snoring. Not Present- Bloody sputum, Chronic Cough, Difficulty Breathing and Wheezing. Breast Present- Breast Mass. Not Present- Breast Pain, Nipple Discharge and Skin Changes. Cardiovascular Present- Swelling of Extremities. Not Present- Chest Pain, Difficulty Breathing Lying Down, Leg Cramps, Palpitations, Rapid Heart Rate and Shortness of Breath. Gastrointestinal Not Present- Abdominal Pain, Bloating, Bloody Stool, Change in Bowel Habits, Chronic diarrhea, Constipation, Difficulty Swallowing, Excessive gas, Gets full quickly at meals, Hemorrhoids, Indigestion, Nausea, Rectal Pain and Vomiting. Female Genitourinary Present- Frequency. Not Present- Nocturia, Painful Urination, Pelvic Pain and Urgency. Musculoskeletal Present- Back Pain. Not  Present- Joint Pain, Joint Stiffness, Muscle Pain, Muscle Weakness and Swelling of Extremities. Neurological Not Present- Decreased Memory, Fainting, Headaches, Numbness, Seizures, Tingling, Tremor, Trouble walking  and Weakness. Psychiatric Present- Anxiety. Not Present- Bipolar, Change in Sleep Pattern, Depression, Fearful and Frequent crying. Endocrine Not Present- Cold Intolerance, Excessive Hunger, Hair Changes, Heat Intolerance, Hot flashes and New Diabetes. Hematology Not Present- Blood Thinners, Easy Bruising, Excessive bleeding, Gland problems, HIV and Persistent Infections.   Physical Exam Tabitha [name] T. [name]; 06/15/2016 11:04 AM) The physical exam findings are as follows: Note:General: Alert, moderately obese Caucasian female, in no distress Skin: Warm and dry without rash or infection. HEENT: No palpable masses or thyromegaly. Sclera nonicteric. Pupils equal round and reactive. Lymph nodes: No cervical, supraclavicular, or inguinal nodes palpable. Breasts: Fairly large hematoma inferior right breast post biopsy. Status post bilateral mammoplasty Lungs: Breath sounds clear and equal. No wheezing or increased work of breathing. Cardiovascular: Regular rate and rhythm without murmer. No JVD or edema. Peripheral pulses intact. No carotid bruits. Abdomen: obese. Soft and nontender. No masses palpable. No organomegaly. No palpable hernias. Extremities: No edema or joint swelling or deformity. No chronic venous stasis changes. Neurologic: Alert and fully oriented. Gait normal. No focal weakness. Psychiatric: Normal mood and affect. Thought content appropriate with normal judgement and insight    Assessment & Plan Tabitha [name] T. [name]; 06/15/2016 11:08 AM) MALIGNANT NEOPLASM OF RIGHT BREAST, STAGE 1, ESTROGEN RECEPTOR POSITIVE (C50.911) Impression: 62 year old female with a new diagnosis of cancer of the right breast, lower after quadrant. Clinical stage 1 a, ER positive, PR  positive, HER-2 negative. I discussed with the patient and family members present today initial surgical treatment options. We discussed options of breast conservation with lumpectomy or total mastectomy and sentinal lymph node biopsy/dissection. she will require genetic testing and is unsure if she would want bilateral mastectomy if she tested genetically positive. if her genetic testing is negative she would desire breast conservation with lumpectomy and sentinel lymph node biopsy. We discussed the indications and nature of the procedure, and expected recovery, in detail. Surgical risks including anesthetic complications, cardiorespiratory complications, bleeding, infection, wound healing complications, blood clots, lymphedema, local and distant recurrence and possible need for further surgery based on the final pathology was discussed and understood. Chemotherapy, hormonal therapy and radiation therapy have been discussed. They have been provided with literature regarding the treatment of breast cancer. All questions were answered. They understand and agree to proceed and we will go ahead with scheduling. Current Plans Referred to Genetic Counseling, for evaluation and follow up (Medical Genetics). Routine. Radioactive seed localizedel lymph node biopsytomy and right axillary sentinel lymph node  Initial high risk genetic panel was negative.

## 2016-07-07 ENCOUNTER — Encounter (HOSPITAL_BASED_OUTPATIENT_CLINIC_OR_DEPARTMENT_OTHER): Payer: Self-pay | Admitting: General Surgery

## 2016-07-12 NOTE — Assessment & Plan Note (Signed)
Malignant neoplasm of lower-outer quadrant of right breast of female, estrogen receptor positive (Tabitha [name])  06/08/2016: Right breast asymmetry posteriorly 7 mm size, no ultrasound correlate, AFFIRM biopsy: Grade 2 invasive ductal carcinoma ER 100%, PR 95%, Ki-67 10%, HER-2 negative ratio 1.58, T1 BN 0 stage IA clinical stage  Genetic counseling and testing (2 paternal aunts with breast cancer): Normal  Recommendations: 1. Breast conserving surgery followed by 2. Oncotype DX testing to determine if chemotherapy would be of any benefit followed by 3. Adjuvant radiation therapy followed by 4. Adjuvant antiestrogen therapy

## 2016-07-13 ENCOUNTER — Ambulatory Visit (HOSPITAL_BASED_OUTPATIENT_CLINIC_OR_DEPARTMENT_OTHER): Payer: PPO | Admitting: Hematology and Oncology

## 2016-07-13 ENCOUNTER — Encounter: Payer: Self-pay | Admitting: Hematology and Oncology

## 2016-07-13 DIAGNOSIS — C50511 Malignant neoplasm of lower-outer quadrant of right female breast: Secondary | ICD-10-CM

## 2016-07-13 DIAGNOSIS — Z170 Estrogen receptor positive status [ER+]: Secondary | ICD-10-CM

## 2016-07-13 NOTE — Progress Notes (Signed)
Patient Care Team: Neale Burly, MD as PCP - General (Internal Medicine) Excell Seltzer, MD as Consulting Physician (General Surgery) Nicholas Lose, MD as Consulting Physician (Hematology and Oncology) Eppie Gibson, MD as Attending Physician (Radiation Oncology)  DIAGNOSIS:  Encounter Diagnosis  Name Primary?  . Malignant neoplasm of lower-outer quadrant of right breast of female, estrogen receptor positive ([place])     SUMMARY OF ONCOLOGIC HISTORY:   Malignant neoplasm of lower-outer quadrant of right breast of female, estrogen receptor positive ([place])   06/08/2016 Initial Diagnosis    Right breast asymmetry posteriorly 7 mm size, no ultrasound correlate, AFFIRM biopsy: Grade 2 invasive ductal carcinoma ER 100%, PR 95%, Ki-67 10%, HER-2 negative ratio 1.58, T1 BN 0 stage IA clinical stage      07/06/2016 Surgery    Right lumpectomy: No tumor noted on the resected specimen       CHIEF COMPLIANT: Follow-up after recent lumpectomy  INTERVAL HISTORY: Tabitha [name] is a 61 year old with above-mentioned history of right breast cancer treated with lumpectomy and is here today to discuss the pathology report. She is recovering fairly well from the surgery apart from the discomfort underneath the arm she is doing quite well. I do not have the official pathology report but on discussing with the pathologist, it appears that there was no evidence of tumor detected on the resected specimen. Because of patient had hematoma during the initial biopsy they could not put radiation seed instead they used needle localization. Dr. Excell Seltzer was able to remove the tumor area at the site of the localization which was also confirmed by radiology.  REVIEW OF SYSTEMS:   Constitutional: Denies fevers, chills or abnormal weight loss Eyes: Denies blurriness of vision Ears, nose, mouth, throat, and face: Denies mucositis or sore throat Respiratory: Denies cough, dyspnea or wheezes Cardiovascular: Denies  palpitation, chest discomfort Gastrointestinal:  Denies nausea, heartburn or change in bowel habits Skin: Denies abnormal skin rashes Lymphatics: Denies new lymphadenopathy or easy bruising Neurological:Denies numbness, tingling or new weaknesses Behavioral/Psych: Mood is stable, no new changes  Extremities: No lower extremity edema Breast:  Recent lumpectomy with discomfort underneath the arm All other systems were reviewed with the patient and are negative.  I have reviewed the past medical history, past surgical history, social history and family history with the patient and they are unchanged from previous note.  ALLERGIES:  is allergic to ampicillin; barium-containing compounds; morphine and related; and penicillins.  MEDICATIONS:  Current Outpatient Prescriptions  Medication Sig Dispense Refill  . alendronate (FOSAMAX) 70 MG tablet Take 70 mg by mouth once a week. Take with a full glass of water on an empty stomach.    . ALPRAZolam (XANAX) 0.5 MG tablet Take 0.5 mg by mouth at bedtime as needed for anxiety.    Marland Kitchen amLODipine (NORVASC) 5 MG tablet Take 5 mg by mouth daily.    Marland Kitchen anastrozole (ARIMIDEX) 1 MG tablet Take 1 tablet (1 mg total) by mouth daily. 30 tablet 6  . atorvastatin (LIPITOR) 20 MG tablet Take 20 mg by mouth daily.    . cholecalciferol (VITAMIN D) 1000 units tablet Take 1 tablet by mouth daily.     Marland Kitchen latanoprost (XALATAN) 0.005 % ophthalmic solution Place 1 drop into both eyes at bedtime.    Marland Kitchen lisinopril (PRINIVIL,ZESTRIL) 40 MG tablet Take 40 mg by mouth daily.    . metFORMIN (GLUCOPHAGE) 500 MG tablet Take 1,000 mg by mouth 2 (two) times daily with a meal. Patient takes 2  po in the morning , and 2 po in the evening.     Marland Kitchen oxyCODONE-acetaminophen (PERCOCET) 10-325 MG tablet Take 1 tablet by mouth every 4 (four) hours as needed for pain. 20 tablet 0  . Pancrelipase, Lip-Prot-Amyl, (CREON) 24000-76000 units CPEP Take 2 capsules by mouth 3 (three) times daily before meals.      . triamterene-hydrochlorothiazide (MAXZIDE) 75-50 MG tablet Take 1 tablet by mouth daily.     No current facility-administered medications for this visit.     PHYSICAL EXAMINATION: ECOG PERFORMANCE STATUS: 1 - Symptomatic but completely ambulatory  Vitals:   07/13/16 1130  BP: (!) 144/74  Pulse: 69  Resp: 18  Temp: 98 F (36.7 C)   Filed Weights   07/13/16 1130  Weight: 193 lb 11.2 oz (87.9 kg)    GENERAL:alert, no distress and comfortable SKIN: skin color, texture, turgor are normal, no rashes or significant lesions EYES: normal, Conjunctiva are pink and non-injected, sclera clear OROPHARYNX:no exudate, no erythema and lips, buccal mucosa, and tongue normal  NECK: supple, thyroid normal size, non-tender, without nodularity LYMPH:  no palpable lymphadenopathy in the cervical, axillary or inguinal LUNGS: clear to auscultation and percussion with normal breathing effort HEART: regular rate & rhythm and no murmurs and no lower extremity edema ABDOMEN:abdomen soft, non-tender and normal bowel sounds MUSCULOSKELETAL:no cyanosis of digits and no clubbing  NEURO: alert & oriented x 3 with fluent speech, no focal motor/sensory deficits EXTREMITIES: No lower extremity edema   LABORATORY DATA:  I have reviewed the data as listed   Chemistry      Component Value Date/Time   NA 141 06/15/2016 0852   K 4.2 06/15/2016 0852   CL 109 01/28/2016 1308   CO2 25 06/15/2016 0852   BUN 8.3 06/15/2016 0852   CREATININE 0.8 06/15/2016 0852      Component Value Date/Time   CALCIUM 9.6 06/15/2016 0852   ALKPHOS 74 06/15/2016 0852   AST 29 06/15/2016 0852   ALT 40 06/15/2016 0852   BILITOT 0.71 06/15/2016 0852       Lab Results  Component Value Date   WBC 9.3 06/15/2016   HGB 15.2 06/15/2016   HCT 44.5 06/15/2016   MCV 87.4 06/15/2016   PLT 259 06/15/2016   NEUTROABS 5.9 06/15/2016    ASSESSMENT & PLAN:  Malignant neoplasm of lower-outer quadrant of right breast of  female, estrogen receptor positive ([place])  06/08/2016: Right breast asymmetry posteriorly 7 mm size, no ultrasound correlate, AFFIRM biopsy: Grade 2 invasive ductal carcinoma ER 100%, PR 95%, Ki-67 10%, HER-2 negative ratio 1.58, T1 BN 0 stage IA clinical stage  Genetic counseling and testing (2 paternal aunts with breast cancer): Normal 07/05/2016: Breast conserving surgery: No tumor detected on the pathological evaluation. If we assume that all the tumor was removed by the initial biopsy, then the final tumor size should be read as 6 mm. I discussed the case with Dr. Excell Seltzer as well as pathology department. Dr. Excell Seltzer will discuss with pathology and radiology as to whether or not the area of concern had been fully removed. She may require another imaging study to verify this.  Recommendations: 1. Breast conserving surgery followed by 2. Oncotype DX testing if the final tumor size is significantly large. 3. Adjuvant radiation therapy followed by 4. Adjuvant antiestrogen therapy  I will call the patient with the final recommendation regarding surgery.  I spent 25 minutes talking to the patient of which more than half was spent in counseling and  coordination of care.  No orders of the defined types were placed in this encounter.  The patient has a good understanding of the overall plan. she agrees with it. she will call with any problems that may develop before the next visit here.   Rulon Eisenmenger, MD 07/13/16

## 2016-07-14 DIAGNOSIS — K861 Other chronic pancreatitis: Secondary | ICD-10-CM | POA: Diagnosis not present

## 2016-07-14 DIAGNOSIS — I10 Essential (primary) hypertension: Secondary | ICD-10-CM | POA: Diagnosis not present

## 2016-07-14 DIAGNOSIS — E784 Other hyperlipidemia: Secondary | ICD-10-CM | POA: Diagnosis not present

## 2016-07-14 DIAGNOSIS — E1142 Type 2 diabetes mellitus with diabetic polyneuropathy: Secondary | ICD-10-CM | POA: Diagnosis not present

## 2016-07-20 ENCOUNTER — Other Ambulatory Visit: Payer: Self-pay | Admitting: General Surgery

## 2016-07-20 DIAGNOSIS — C50911 Malignant neoplasm of unspecified site of right female breast: Secondary | ICD-10-CM

## 2016-07-22 ENCOUNTER — Ambulatory Visit: Payer: Self-pay | Admitting: Genetics

## 2016-07-22 ENCOUNTER — Telehealth: Payer: Self-pay | Admitting: Genetics

## 2016-07-22 ENCOUNTER — Encounter: Payer: Self-pay | Admitting: Genetics

## 2016-07-22 DIAGNOSIS — Z1379 Encounter for other screening for genetic and chromosomal anomalies: Secondary | ICD-10-CM

## 2016-07-22 HISTORY — DX: Encounter for other screening for genetic and chromosomal anomalies: Z13.79

## 2016-07-22 NOTE — Telephone Encounter (Signed)
Reviewed that germline genetic testing revealed no pathogenic mutations. This is considered to be a negative result. Testing was performed through [facility]'s 46-gene Common Hereditary Cancers Panel. [facility] Common Hereditary Cancers Panel includes analysis of the following 46 genes: APC, ATM, AXIN2, BARD1, BMPR1A, BRCA1, BRCA2, BRIP1, CDH1, CDKN2A, CHEK2, CTNNA1, DICER1, EPCAM, GREM1, HOXB13, KIT, MEN1, MLH1, MSH2, MSH3, MSH6, MUTYH, NBN, NF1, NTHL1, PALB2, PDGFRA, PMS2, POLD1, POLE, PTEN, RAD50, RAD51C, RAD51D, SDHA, SDHB, SDHC, SDHD, SMAD4, SMARCA4, STK11, TP53, TSC1, TSC2, and VHL.  Variants of uncertain significance (VUSs) were noted in DICER1 and MSH3. Discussed that VUSs should not change clinical management. DICER1 c.1468C>T (p.Arg490Cys) MSH3 c.2041C>T (p.Pro681Ser)  For more detailed discussion, please see genetic counseling documentation from 07/22/2016. Result report dated 07/01/2016.

## 2016-07-22 NOTE — Progress Notes (Signed)
HPI: Tabitha [name] was previously seen in the [place] clinic on 06/15/2016 due to a personal and family history of cancer and concerns regarding a hereditary predisposition to cancer. Please refer to our prior cancer genetics clinic note for more information regarding Tabitha [name]'s medical, social and family histories, and our assessment and recommendations, at the time. Tabitha [name] recent genetic test results were disclosed to her, as were recommendations warranted by these results. These results and recommendations are discussed in more detail below.  CANCER HISTORY: In May 2018, at the age of 61, Tabitha [name] was diagnosed with ER/PR+ HER2- invasive ductal carcinoma of the right breast. Her treatment is pending. She underwent lumpectomy on 07/06/2016 Ms. Bole's health history is also significant for a carcinoid tumor of her colon in her 61s. Her most recent colonoscopy, performed in December 2014, revealed 3 adenomatous polyps and a hyperplastic polyp.      Malignant neoplasm of lower-outer quadrant of right breast of female, estrogen receptor positive ([place])   06/08/2016 Initial Diagnosis    Right breast asymmetry posteriorly 7 mm size, no ultrasound correlate, AFFIRM biopsy: Grade 2 invasive ductal carcinoma ER 100%, PR 95%, Ki-67 10%, HER-2 negative ratio 1.58, T1 BN 0 stage IA clinical stage      07/06/2016 Surgery    Right lumpectomy: No tumor noted on the resected specimen        FAMILY HISTORY:  We obtained a detailed, 4-generation family history.  Significant diagnoses are listed below: Family History  Problem Relation Age of Onset  . Prostate cancer Father        d.69  . Diabetes Sister   . Breast cancer Sister 41       Maternal half-sister  . Esophageal varices Brother   . Diabetes Sister   . Breast cancer Paternal Aunt   . Brain cancer Paternal Uncle   . Breast cancer Maternal Grandmother        d.60s  . Breast cancer Paternal Aunt   . Skin cancer Paternal  Uncle    Tabitha [name] has one biological daughter, age 53, who has no history of cancer. Tabitha [name] also adopted her brother's daughter as an infant. This niece/daughter is also cancer-free. Tabitha [name]'s full-brother died at age 18 from liver disease related to Hepatitis/drug use. Tabitha [name] full sister is 4 without cancers. Tabitha [name] maternal half-sister is currently 47 and had breast cancer in her 56s.  Ms. Schueller's mother died at age 66 in a car accident. She had no previous illnesses/cancers. Both of Tabitha [name] maternal aunts are deceased without cancers. Her mother's maternal half-brother is also deceased without cancer. Her maternal grandmother died in her 20s and had a history of breast cancer. Her maternal grandfather is deceased without cancer.  Tabitha [name] father died at 20 with a history of prostate cancer. Tabitha [name] reported that when her father died, he "had tumors everywhere". Tabitha [name] had two paternal aunts with breast cancer. One is living, the other is deceased. A paternal uncle died of brain cancer and another died with skin cancer. Tabitha [name] father had 11 siblings in total. Tabitha [name] paternal grandmother died in her late-60s without cancer. There is no information about her paternal grandfather.  Tabitha [name] is unaware of previous family history of genetic testing for hereditary cancer risks. Patient's maternal and paternal ancestors are of Caucasian descent. There is no reported Ashkenazi Jewish ancestry. There is no known consanguinity.  GENETIC TEST RESULTS: Genetic testing performed through  [employer] Common Hereditary Cancers Panel reported out on 07/01/2016 showed no pathogenic mutations. [facility] Common Hereditary Cancers Panel includes analysis of the following 46 genes: APC, ATM, AXIN2, BARD1, BMPR1A, BRCA1, BRCA2, BRIP1, CDH1, CDKN2A, CHEK2, CTNNA1, DICER1, EPCAM, GREM1, HOXB13, KIT, MEN1, MLH1, MSH2, MSH3, MSH6, MUTYH, NBN, NF1, NTHL1, PALB2, PDGFRA, PMS2,  POLD1, POLE, PTEN, RAD50, RAD51C, RAD51D, SDHA, SDHB, SDHC, SDHD, SMAD4, SMARCA4, STK11, TP53, TSC1, TSC2, and VHL.  Variants of uncertain significance (VUSs) called DICER1 c.1468C>T (p.Arg490Cys) and MSH3 c.2041C>T (p.Pro681Ser) were also noted. At this time, it is unknown if either of these variants are associated with increased cancer risk or if this is a normal finding, but most variants such as these get reclassified to being inconsequential. Neither should be used to make medical management decisions. With time, we suspect the lab will determine the significance of this variant, if any. If we do learn more about either variant, we will try to contact Tabitha [name] to discuss it further. However, it is important to stay in touch with [place] periodically and keep the address and phone number up to date.  The test report will be scanned into EPIC and will be located under the Molecular Pathology section of the Results Review tab.A portion of the result report is included below for reference.     We discussed with Ms. Mcnutt that since the current genetic testing is not perfect, it is possible there may be a gene mutation in one of these genes that current testing cannot detect, but that chance is small. We also discussed, that it is possible that another gene that has not yet been discovered, or that we have not yet tested, is responsible for the cancer diagnoses in the family. Therefore, important to remain in touch with cancer genetics in the future so that we can continue to offer Ms. Bazile the most up to date genetic testing.   CANCER SCREENING RECOMMENDATIONS: This result indicates that it is unlikely Ms. Marcom has an increased risk for a future cancer due to a mutation in one of these genes. Because no causative or actionable mutations were identified, it is recommended she continue to follow the cancer management and screening guidelines provided by her oncology and primary healthcare  providers.  RECOMMENDATIONS FOR FAMILY MEMBERS: Women in this family might be at some increased risk of developing breast cancer, over the general population risk, simply due to the family history of cancer. We recommended women in this family have a yearly mammogram beginning at age 85, or 84 years younger than the earliest onset of cancer, an annual clinical breast exam, and perform monthly breast self-exams. Women in this family should also have a gynecological exam as recommended by their primary provider. All family members should have a colonoscopy by age 37.  Based on Ms. Suto's family history, we recommended her maternal half-sister, who was diagnosed with breast cancer in her 68s, have genetic counseling and testing. Ms. Spagna will let us know if we can be of any assistance in coordinating genetic counseling and/or testing for this family member.   FOLLOW-UP: Lastly, we discussed with Ms. Platz that cancer genetics is a rapidly advancing field and it is possible that new genetic tests will be appropriate for her and/or her family members in the future. We encouraged her to remain in contact with cancer genetics on an annual basis so we can update her personal and family histories and let her know of advances in cancer genetics that may benefit this family.  Our contact number was provided. Ms. Shevchenko's questions were answered to her satisfaction, and she knows she is welcome to call us at anytime with additional questions or concerns.   Mal Misty, MS, [facility] Certified Naval [email or web address]

## 2016-07-28 ENCOUNTER — Ambulatory Visit
Admission: RE | Admit: 2016-07-28 | Discharge: 2016-07-28 | Disposition: A | Discharge status: Home / Self Care | Payer: PPO | Source: Ambulatory Visit | Attending: General Surgery | Admitting: General Surgery

## 2016-07-28 DIAGNOSIS — C50911 Malignant neoplasm of unspecified site of right female breast: Secondary | ICD-10-CM

## 2016-07-28 DIAGNOSIS — N6489 Other specified disorders of breast: Secondary | ICD-10-CM | POA: Diagnosis not present

## 2016-07-28 HISTORY — DX: Malignant (primary) neoplasm, unspecified: C80.1

## 2016-07-29 ENCOUNTER — Other Ambulatory Visit: Payer: Self-pay | Admitting: General Surgery

## 2016-07-29 DIAGNOSIS — C50911 Malignant neoplasm of unspecified site of right female breast: Secondary | ICD-10-CM

## 2016-07-29 DIAGNOSIS — Z170 Estrogen receptor positive status [ER+]: Principal | ICD-10-CM

## 2016-07-31 ENCOUNTER — Ambulatory Visit
Admission: RE | Admit: 2016-07-31 | Discharge: 2016-07-31 | Disposition: A | Discharge status: Home / Self Care | Payer: PPO | Source: Ambulatory Visit | Attending: General Surgery | Admitting: General Surgery

## 2016-07-31 DIAGNOSIS — C50911 Malignant neoplasm of unspecified site of right female breast: Secondary | ICD-10-CM | POA: Diagnosis not present

## 2016-07-31 DIAGNOSIS — Z170 Estrogen receptor positive status [ER+]: Principal | ICD-10-CM

## 2016-07-31 MED ORDER — GADOBENATE DIMEGLUMINE 529 MG/ML IV SOLN
18.0000 mL | Freq: Once | INTRAVENOUS | Status: AC | PRN
  Administered 2016-07-31: 18 mL via INTRAVENOUS

## 2016-08-04 ENCOUNTER — Ambulatory Visit: Payer: Self-pay | Admitting: General Surgery

## 2016-08-04 DIAGNOSIS — Z170 Estrogen receptor positive status [ER+]: Principal | ICD-10-CM

## 2016-08-04 DIAGNOSIS — C50911 Malignant neoplasm of unspecified site of right female breast: Secondary | ICD-10-CM

## 2016-08-09 ENCOUNTER — Other Ambulatory Visit: Payer: Self-pay | Admitting: General Surgery

## 2016-08-09 DIAGNOSIS — Z170 Estrogen receptor positive status [ER+]: Principal | ICD-10-CM

## 2016-08-09 DIAGNOSIS — N63 Unspecified lump in unspecified breast: Secondary | ICD-10-CM

## 2016-08-09 DIAGNOSIS — C50911 Malignant neoplasm of unspecified site of right female breast: Secondary | ICD-10-CM

## 2016-08-12 ENCOUNTER — Telehealth: Payer: Self-pay | Admitting: Hematology and Oncology

## 2016-08-12 NOTE — Telephone Encounter (Signed)
lvm to inform pt of 8/16 appt at 1130 per sch msg

## 2016-08-18 DIAGNOSIS — H353191 Nonexudative age-related macular degeneration, unspecified eye, early dry stage: Secondary | ICD-10-CM | POA: Diagnosis not present

## 2016-08-18 DIAGNOSIS — E119 Type 2 diabetes mellitus without complications: Secondary | ICD-10-CM | POA: Diagnosis not present

## 2016-08-18 DIAGNOSIS — H40053 Ocular hypertension, bilateral: Secondary | ICD-10-CM | POA: Diagnosis not present

## 2016-08-18 DIAGNOSIS — H2513 Age-related nuclear cataract, bilateral: Secondary | ICD-10-CM | POA: Diagnosis not present

## 2016-08-24 DIAGNOSIS — K861 Other chronic pancreatitis: Secondary | ICD-10-CM | POA: Diagnosis not present

## 2016-08-24 DIAGNOSIS — E1142 Type 2 diabetes mellitus with diabetic polyneuropathy: Secondary | ICD-10-CM | POA: Diagnosis not present

## 2016-08-24 DIAGNOSIS — I10 Essential (primary) hypertension: Secondary | ICD-10-CM | POA: Diagnosis not present

## 2016-08-24 DIAGNOSIS — E784 Other hyperlipidemia: Secondary | ICD-10-CM | POA: Diagnosis not present

## 2016-08-26 DIAGNOSIS — K858 Other acute pancreatitis without necrosis or infection: Secondary | ICD-10-CM | POA: Diagnosis not present

## 2016-08-30 DIAGNOSIS — K861 Other chronic pancreatitis: Secondary | ICD-10-CM | POA: Diagnosis not present

## 2016-08-30 DIAGNOSIS — E784 Other hyperlipidemia: Secondary | ICD-10-CM | POA: Diagnosis not present

## 2016-08-30 DIAGNOSIS — E1165 Type 2 diabetes mellitus with hyperglycemia: Secondary | ICD-10-CM | POA: Diagnosis not present

## 2016-08-30 DIAGNOSIS — Z6832 Body mass index (BMI) 32.0-32.9, adult: Secondary | ICD-10-CM | POA: Diagnosis not present

## 2016-08-30 DIAGNOSIS — I10 Essential (primary) hypertension: Secondary | ICD-10-CM | POA: Diagnosis not present

## 2016-09-05 ENCOUNTER — Ambulatory Visit
Admission: RE | Admit: 2016-09-05 | Discharge: 2016-09-05 | Disposition: A | Discharge status: Home / Self Care | Payer: PPO | Source: Ambulatory Visit | Attending: General Surgery | Admitting: General Surgery

## 2016-09-05 ENCOUNTER — Encounter (HOSPITAL_COMMUNITY)
Admission: RE | Admit: 2016-09-05 | Discharge: 2016-09-05 | Disposition: A | Discharge status: Home / Self Care | Payer: PPO | Source: Ambulatory Visit | Attending: General Surgery | Admitting: General Surgery

## 2016-09-05 ENCOUNTER — Encounter (HOSPITAL_COMMUNITY): Payer: Self-pay

## 2016-09-05 ENCOUNTER — Ambulatory Visit: Payer: PPO

## 2016-09-05 DIAGNOSIS — Z01812 Encounter for preprocedural laboratory examination: Secondary | ICD-10-CM | POA: Diagnosis not present

## 2016-09-05 DIAGNOSIS — C50911 Malignant neoplasm of unspecified site of right female breast: Secondary | ICD-10-CM | POA: Insufficient documentation

## 2016-09-05 DIAGNOSIS — N63 Unspecified lump in unspecified breast: Secondary | ICD-10-CM

## 2016-09-05 DIAGNOSIS — R928 Other abnormal and inconclusive findings on diagnostic imaging of breast: Secondary | ICD-10-CM | POA: Diagnosis not present

## 2016-09-05 DIAGNOSIS — Z170 Estrogen receptor positive status [ER+]: Principal | ICD-10-CM

## 2016-09-05 HISTORY — DX: Presence of dental prosthetic device (complete) (partial): Z97.2

## 2016-09-05 HISTORY — DX: Anxiety disorder, unspecified: F41.9

## 2016-09-05 HISTORY — DX: Personal history of urinary calculi: Z87.442

## 2016-09-05 HISTORY — DX: Gastro-esophageal reflux disease without esophagitis: K21.9

## 2016-09-05 HISTORY — DX: Presence of spectacles and contact lenses: Z97.3

## 2016-09-05 LAB — BASIC METABOLIC PANEL
ANION GAP: 8 (ref 5–15)
BUN: 8 mg/dL (ref 6–20)
CALCIUM: 9.4 mg/dL (ref 8.9–10.3)
CO2: 24 mmol/L (ref 22–32)
CREATININE: 0.73 mg/dL (ref 0.44–1.00)
Chloride: 106 mmol/L (ref 101–111)
Glucose, Bld: 130 mg/dL — ABNORMAL HIGH (ref 65–99)
Potassium: 4.2 mmol/L (ref 3.5–5.1)
SODIUM: 138 mmol/L (ref 135–145)

## 2016-09-05 LAB — CBC
HCT: 44.7 % (ref 36.0–46.0)
Hemoglobin: 15.2 g/dL — ABNORMAL HIGH (ref 12.0–15.0)
MCH: 29.2 pg (ref 26.0–34.0)
MCHC: 34 g/dL (ref 30.0–36.0)
MCV: 86 fL (ref 78.0–100.0)
PLATELETS: 247 10*3/uL (ref 150–400)
RBC: 5.2 MIL/uL — AB (ref 3.87–5.11)
RDW: 12.6 % (ref 11.5–15.5)
WBC: 10.7 10*3/uL — AB (ref 4.0–10.5)

## 2016-09-05 LAB — GLUCOSE, CAPILLARY: GLUCOSE-CAPILLARY: 128 mg/dL — AB (ref 65–99)

## 2016-09-05 NOTE — Progress Notes (Signed)
Pt denies SOB, chest pain, and being under the care of a cardiologist. Pt denies having a cardiac cath and echo but stated that a stress test was performed > 10 years ago. Pt denies having a chest x ray within the last year. Pt denies having an A1c within the last 2 months; pt denies recent labs. Pt instructed to drink the 8 ounces of water given to her during PAT appointment 2 hours prior to arrival to hospital on DOS.

## 2016-09-05 NOTE — Pre-Procedure Instructions (Addendum)
FAREEDA DOWNARD  09/05/2016      [facility] - [address] [address], [place], [place] [address] [place] [address] Phone: [phone] Fax: [phone]    Your procedure is scheduled on Wednesday, September 14, 2016  Report to [facility] Admitting at 9:30A.M.  Call this number if you have problems the morning of surgery:  [phone]   Remember:  Do not eat food or drink liquids after midnight: Tuesday, September 13, 2016  Take these medicines the morning of surgery with A SIP OF WATER : amLODipine (NORVASC), pain medication Stop taking Aspirin, vitamins, fish oil and herbal medications. Do not take any NSAIDs ie: Ibuprofen, Advil, Naproxen (Aleve), Motrin, BC and Goody Powder or any medication containing Aspirin; stop Wednesday, September 07, 2016    How to Manage Your Diabetes Before and After Surgery  Why is it important to control my blood sugar before and after surgery? . Improving blood sugar levels before and after surgery helps healing and can limit problems. . A way of improving blood sugar control is eating a healthy diet by: o  Eating less sugar and carbohydrates o  Increasing activity/exercise o  Talking with your doctor about reaching your blood sugar goals . High blood sugars (greater than 180 mg/dL) can raise your risk of infections and slow your recovery, so you will need to focus on controlling your diabetes during the weeks before surgery. . Make sure that the doctor who takes care of your diabetes knows about your planned surgery including the date and location.  How do I manage my blood sugar before surgery? . Check your blood sugar at least 4 times a day, starting 2 days before surgery, to make sure that the level is not too high or low. o Check your blood sugar the morning of your surgery when you wake up and every 2 hours until you get to the Short Stay unit. . If your blood sugar is less than 70 mg/dL, you will need to treat for low  blood sugar: o Do not take insulin. o Treat a low blood sugar (less than 70 mg/dL) with  cup of clear juice (cranberry or apple), 4 glucose tablets, OR glucose gel. o Recheck blood sugar in 15 minutes after treatment (to make sure it is greater than 70 mg/dL). If your blood sugar is not greater than 70 mg/dL on recheck, call [phone] for further instructions. . Report your blood sugar to the short stay nurse when you get to Short Stay.  . If you are admitted to the hospital after surgery: o Your blood sugar will be checked by the staff and you will probably be given insulin after surgery (instead of oral diabetes medicines) to make sure you have good blood sugar levels. o The goal for blood sugar control after surgery is 80-180 mg/dL.  WHAT DO I DO ABOUT MY DIABETES MEDICATION?  Marland Kitchen Do not take oral diabetes medicines (pills) the morning of surgery such as metFORMIN (GLUCOPHAGE)  Reviewed and Endorsed by [facility] Patient Education Committee, August 2015  Do not wear jewelry, make-up or nail polish.  Do not wear lotions, powders, or perfumes, or deoderant.  Do not shave 48 hours prior to surgery.    Do not bring valuables to the hospital.  [facility] is not responsible for any belongings or valuables. Contacts, dentures or bridgework may not be worn into surgery.  Leave your suitcase in the car.  After surgery it may be brought to your room. For patients admitted to the hospital, discharge time will be determined by your treatment team. Patients discharged the day of surgery will not be allowed to drive home.  Special instructions: Shower the night before surgery and the morning of surgery with CHG. Please read over the following fact sheets that you were given. Pain Booklet, Coughing and Deep Breathing and Surgical Site Infection Prevention

## 2016-09-06 LAB — HEMOGLOBIN A1C
HEMOGLOBIN A1C: 6.9 % — AB (ref 4.8–5.6)
MEAN PLASMA GLUCOSE: 151 mg/dL

## 2016-09-11 ENCOUNTER — Telehealth: Payer: Self-pay

## 2016-09-11 NOTE — Telephone Encounter (Signed)
Spoke with patient and she is aware of her new appt due to pal day  Tabitha [name]

## 2016-09-12 ENCOUNTER — Ambulatory Visit
Admission: RE | Admit: 2016-09-12 | Discharge: 2016-09-12 | Disposition: A | Discharge status: Home / Self Care | Payer: PPO | Source: Ambulatory Visit | Attending: General Surgery | Admitting: General Surgery

## 2016-09-12 DIAGNOSIS — Z170 Estrogen receptor positive status [ER+]: Principal | ICD-10-CM

## 2016-09-12 DIAGNOSIS — N6313 Unspecified lump in the right breast, lower outer quadrant: Secondary | ICD-10-CM | POA: Diagnosis not present

## 2016-09-12 DIAGNOSIS — C50911 Malignant neoplasm of unspecified site of right female breast: Secondary | ICD-10-CM

## 2016-09-13 MED ORDER — ACETAMINOPHEN 500 MG PO TABS
1000.0000 mg | ORAL_TABLET | ORAL | Status: AC
  Administered 2016-09-14: 1000 mg via ORAL
  Filled 2016-09-13: qty 2

## 2016-09-13 MED ORDER — CIPROFLOXACIN IN D5W 400 MG/200ML IV SOLN
400.0000 mg | INTRAVENOUS | Status: AC
  Administered 2016-09-14: 400 mg via INTRAVENOUS
  Filled 2016-09-13: qty 200

## 2016-09-13 MED ORDER — CELECOXIB 200 MG PO CAPS
400.0000 mg | ORAL_CAPSULE | ORAL | Status: AC
  Administered 2016-09-14: 400 mg via ORAL
  Filled 2016-09-13: qty 2

## 2016-09-13 MED ORDER — GABAPENTIN 300 MG PO CAPS
300.0000 mg | ORAL_CAPSULE | ORAL | Status: AC
  Administered 2016-09-14: 300 mg via ORAL
  Filled 2016-09-13: qty 1

## 2016-09-13 NOTE — H&P (Signed)
History of Present Illness The patient is a 61 year old female who presents with breast cancer. She is a post menopausal female referred by Dr. Malka So for evaluation of recently diagnosed carcinoma of the right breast. She recently presented for a screening mamogram revealing possible asymmetry in the right breast. Subsequent imaging included diagnostic mamogram showing a 7 mm irregular mass in the inferior right breast seen on the MLO view only. Ultrasound was performed showing no ultrasound correlate and no apparent adenopathy. A stereotactic guided biopsywas performed on 06/08/2016 with pathology revealing invasive ductal carcinoma of the breast. She is seen now in breast multidisciplinary clinic for initial treatment planning. She has experienced no breast symptoms, specifically lump, skin changes or nipple discharge. Findings at that time were the following:  Tumor size: 0.7 cm Tumor grade: 2, Ki-67 10% Estrogen Receptor: +100% Progesterone Receptor: +95% Her-2 neu: negative Lymph node status: negative  She developed a post biopsy hematoma with apparent significant displacement of the marking clip. Therefore at the time of lumpectomy was recommended that she have a wire localization rather than see localization. At the time of localization the mass was felt to only be seen on one view. I performed a wire localized lumpectomy with the mass apparently seen in the specimen but final pathology showed no tumor nor any post biopsy change. This was all reviewed with Dr. Purcell Nails and we felt that quite possibly the small mass seen at the time of her localization which was difficult to visualize was not the tumor but possibly an area of scar fibrocystic change. I would have to assume that the tumor remains based on the pathology report.  We obtained an MRI.There is seen a post-excision hematoma but a separate small hematoma with in the lower outer right breast separate from the surgical  hematoma which is thought to represent residual hematoma from the original stereotactic biopsy with a clip artifact along the medial aspect of the hematoma. No suspicious enhancement or mass was seen. The small more lateral inferior hematoma was felt to be the most likely site of any residual carcinoma and it was recommended that any further surgery should target the marking clip. Subsequently a mammogram was also obtained which was negative     Problem List/Past Medical  MALIGNANT NEOPLASM OF RIGHT BREAST, STAGE 1, ESTROGEN RECEPTOR POSITIVE (C50.911)   Past Surgical History  Appendectomy  Breast Biopsy  Right. Gallbladder Surgery - Laparoscopic  Hysterectomy (not due to cancer) - Partial  Mammoplasty; Reduction  Bilateral. Shoulder Surgery  Right. Spinal Surgery Midback   Diagnostic Studies History  Colonoscopy  1-5 years ago Mammogram  within last year Pap Smear  1-5 years ago  Allergies  Morphine Derivatives  Penicillins  Ampicillin *PENICILLINS*  Barium-containing Compounds  Allergies Reconciled   Medication History  AmLODIPine Besylate (5MG Tablet, Oral) Active. ALPRAZolam (0.5MG Tablet, Oral) Active. Alendronate Sodium (70MG Tablet, Oral) Active. Anastrozole (1MG Tablet, Oral) Active. Atorvastatin Calcium (20MG Tablet, Oral) Active. Latanoprost (0.005% Solution, Ophthalmic) Active. Lisinopril (40MG Tablet, Oral) Active. MetFORMIN HCl (500MG Tablet, Oral) Active. OxyCODONE HCl (10MG Tablet, Oral) Active. Creon (24000-76000UNIT Capsule DR Part, Oral) Active. Triamterene-HCTZ (75-50MG Tablet, Oral) Active. Medications Reconciled Omeprazole (20MG Capsule DR, Oral) Active.  Social History  Caffeine use  Carbonated beverages, Coffee. No alcohol use  No drug use  Tobacco use  Current every day smoker.  Family History  Alcohol Abuse  Father, Mother. Arthritis  Father. Breast Cancer  Family Members In General. Diabetes  Mellitus  Family Members  In General. Heart Disease  Family Members In General. Migraine Headache  Sister. Prostate Cancer  Father. Thyroid problems  Daughter.  Pregnancy / Birth History  Age at menarche  76 years. Age of menopause  51-55 Contraceptive History  Oral contraceptives. Gravida  1 Maternal age  36-30 Para  1  Other Problems Anxiety Disorder  Arthritis  Back Pain  Bladder Problems  Diabetes Mellitus  Hemorrhoids  High blood pressure  Hypercholesterolemia  Kidney Stone  Lump In Breast  Pancreatitis   Vitals  Weight: 192 lb Height: 64in Body Surface Area: 1.92 m Body Mass Index: 32.96 kg/m  Temp.: 98.29F  Pulse: 74 (Regular)  BP: 140/80 (Sitting, Left Arm, Standard)       Physical Exam  The physical exam findings are as follows: Note:General: Appears well Breasts: There is mild induration and tenderness and faint erythema of the right breast with nicely healing incisions. Axillary incision well healed.    Assessment & Plan  MALIGNANT NEOPLASM OF RIGHT BREAST, STAGE 1, ESTROGEN RECEPTOR POSITIVE (C50.911) Impression: 61 year old female with a recent diagnosis of cancer of the right breast, lower after quadrant. Clinical stage 1 a, ER positive, PR positive, HER-2 negative.  Difficult imaging as detailed above and I have to assume that the original tumor has not been removed. This was all discussed in detail with the patient and her daughter today. Based on the MRI have recommended radioactive seed localized right breast lumpectomy targeting the original marking clip and excising the original postbiopsy hematoma.  All their questions were answered.

## 2016-09-14 ENCOUNTER — Ambulatory Visit
Admission: RE | Admit: 2016-09-14 | Discharge: 2016-09-14 | Disposition: A | Discharge status: Home / Self Care | Payer: PPO | Source: Ambulatory Visit | Attending: General Surgery | Admitting: General Surgery

## 2016-09-14 ENCOUNTER — Encounter (HOSPITAL_COMMUNITY): Payer: Self-pay

## 2016-09-14 ENCOUNTER — Ambulatory Visit (HOSPITAL_COMMUNITY): Payer: PPO | Admitting: Certified Registered Nurse Anesthetist

## 2016-09-14 ENCOUNTER — Ambulatory Visit (HOSPITAL_COMMUNITY)
Admission: RE | Admit: 2016-09-14 | Discharge: 2016-09-14 | Disposition: A | Discharge status: Home / Self Care | Payer: PPO | Source: Ambulatory Visit | Attending: General Surgery | Admitting: General Surgery

## 2016-09-14 ENCOUNTER — Encounter (HOSPITAL_COMMUNITY)
Admission: RE | Disposition: A | Discharge status: Home / Self Care | Payer: Self-pay | Source: Ambulatory Visit | Attending: General Surgery

## 2016-09-14 DIAGNOSIS — Z888 Allergy status to other drugs, medicaments and biological substances status: Secondary | ICD-10-CM | POA: Diagnosis not present

## 2016-09-14 DIAGNOSIS — Z7984 Long term (current) use of oral hypoglycemic drugs: Secondary | ICD-10-CM | POA: Diagnosis not present

## 2016-09-14 DIAGNOSIS — C50911 Malignant neoplasm of unspecified site of right female breast: Secondary | ICD-10-CM | POA: Insufficient documentation

## 2016-09-14 DIAGNOSIS — K219 Gastro-esophageal reflux disease without esophagitis: Secondary | ICD-10-CM | POA: Insufficient documentation

## 2016-09-14 DIAGNOSIS — M1990 Unspecified osteoarthritis, unspecified site: Secondary | ICD-10-CM | POA: Diagnosis not present

## 2016-09-14 DIAGNOSIS — Z780 Asymptomatic menopausal state: Secondary | ICD-10-CM | POA: Insufficient documentation

## 2016-09-14 DIAGNOSIS — I10 Essential (primary) hypertension: Secondary | ICD-10-CM | POA: Diagnosis not present

## 2016-09-14 DIAGNOSIS — F17200 Nicotine dependence, unspecified, uncomplicated: Secondary | ICD-10-CM | POA: Insufficient documentation

## 2016-09-14 DIAGNOSIS — E7800 Pure hypercholesterolemia, unspecified: Secondary | ICD-10-CM | POA: Diagnosis not present

## 2016-09-14 DIAGNOSIS — K861 Other chronic pancreatitis: Secondary | ICD-10-CM | POA: Diagnosis not present

## 2016-09-14 DIAGNOSIS — Z79899 Other long term (current) drug therapy: Secondary | ICD-10-CM | POA: Diagnosis not present

## 2016-09-14 DIAGNOSIS — F419 Anxiety disorder, unspecified: Secondary | ICD-10-CM | POA: Diagnosis not present

## 2016-09-14 DIAGNOSIS — H409 Unspecified glaucoma: Secondary | ICD-10-CM | POA: Diagnosis not present

## 2016-09-14 DIAGNOSIS — Z880 Allergy status to penicillin: Secondary | ICD-10-CM | POA: Insufficient documentation

## 2016-09-14 DIAGNOSIS — Z170 Estrogen receptor positive status [ER+]: Principal | ICD-10-CM

## 2016-09-14 DIAGNOSIS — R928 Other abnormal and inconclusive findings on diagnostic imaging of breast: Secondary | ICD-10-CM | POA: Diagnosis not present

## 2016-09-14 DIAGNOSIS — E119 Type 2 diabetes mellitus without complications: Secondary | ICD-10-CM | POA: Insufficient documentation

## 2016-09-14 DIAGNOSIS — N641 Fat necrosis of breast: Secondary | ICD-10-CM | POA: Diagnosis not present

## 2016-09-14 HISTORY — PX: BREAST LUMPECTOMY WITH RADIOACTIVE SEED LOCALIZATION: SHX6424

## 2016-09-14 HISTORY — PX: BREAST LUMPECTOMY: SHX2

## 2016-09-14 LAB — GLUCOSE, CAPILLARY
Glucose-Capillary: 140 mg/dL — ABNORMAL HIGH (ref 65–99)
Glucose-Capillary: 128 mg/dL — ABNORMAL HIGH (ref 65–99)

## 2016-09-14 SURGERY — BREAST LUMPECTOMY WITH RADIOACTIVE SEED LOCALIZATION
Anesthesia: General | Site: Breast | Laterality: Right

## 2016-09-14 MED ORDER — PROPOFOL 10 MG/ML IV BOLUS
INTRAVENOUS | Status: AC
  Filled 2016-09-14: qty 20

## 2016-09-14 MED ORDER — PHENYLEPHRINE HCL 10 MG/ML IJ SOLN
INTRAMUSCULAR | Status: DC | PRN
  Administered 2016-09-14: 25 ug/min via INTRAVENOUS

## 2016-09-14 MED ORDER — BUPIVACAINE-EPINEPHRINE (PF) 0.25% -1:200000 IJ SOLN
INTRAMUSCULAR | Status: AC
  Filled 2016-09-14: qty 30

## 2016-09-14 MED ORDER — ONDANSETRON HCL 4 MG/2ML IJ SOLN
INTRAMUSCULAR | Status: AC
  Filled 2016-09-14: qty 2

## 2016-09-14 MED ORDER — 0.9 % SODIUM CHLORIDE (POUR BTL) OPTIME
TOPICAL | Status: DC | PRN
  Administered 2016-09-14: 1000 mL

## 2016-09-14 MED ORDER — DEXAMETHASONE SODIUM PHOSPHATE 10 MG/ML IJ SOLN
INTRAMUSCULAR | Status: DC | PRN
  Administered 2016-09-14: 10 mg via INTRAVENOUS

## 2016-09-14 MED ORDER — SUCCINYLCHOLINE CHLORIDE 20 MG/ML IJ SOLN
INTRAMUSCULAR | Status: DC | PRN
  Administered 2016-09-14: 100 mg via INTRAVENOUS

## 2016-09-14 MED ORDER — PHENYLEPHRINE 40 MCG/ML (10ML) SYRINGE FOR IV PUSH (FOR BLOOD PRESSURE SUPPORT)
PREFILLED_SYRINGE | INTRAVENOUS | Status: DC | PRN
  Administered 2016-09-14: 80 ug via INTRAVENOUS

## 2016-09-14 MED ORDER — FENTANYL CITRATE (PF) 100 MCG/2ML IJ SOLN: 25.0000 ug | INTRAMUSCULAR | Status: DC | PRN

## 2016-09-14 MED ORDER — ONDANSETRON HCL 4 MG/2ML IJ SOLN
INTRAMUSCULAR | Status: DC | PRN
  Administered 2016-09-14: 4 mg via INTRAVENOUS

## 2016-09-14 MED ORDER — SUCCINYLCHOLINE CHLORIDE 200 MG/10ML IV SOSY
PREFILLED_SYRINGE | INTRAVENOUS | Status: AC
  Filled 2016-09-14: qty 10

## 2016-09-14 MED ORDER — DEXAMETHASONE SODIUM PHOSPHATE 10 MG/ML IJ SOLN
INTRAMUSCULAR | Status: AC
  Filled 2016-09-14: qty 1

## 2016-09-14 MED ORDER — CHLORHEXIDINE GLUCONATE CLOTH 2 % EX PADS: 6.0000 | MEDICATED_PAD | Freq: Once | CUTANEOUS | Status: DC

## 2016-09-14 MED ORDER — OXYCODONE-ACETAMINOPHEN 10-325 MG PO TABS: 1.0000 | ORAL_TABLET | ORAL | 0 refills | Status: DC | PRN

## 2016-09-14 MED ORDER — MIDAZOLAM HCL 2 MG/2ML IJ SOLN
INTRAMUSCULAR | Status: AC
  Filled 2016-09-14: qty 2

## 2016-09-14 MED ORDER — BUPIVACAINE-EPINEPHRINE 0.25% -1:200000 IJ SOLN
INTRAMUSCULAR | Status: DC | PRN
  Administered 2016-09-14: 20 mL

## 2016-09-14 MED ORDER — OXYCODONE-ACETAMINOPHEN 5-325 MG PO TABS
ORAL_TABLET | ORAL | Status: DC
Start: 2016-09-14 — End: 2016-09-14
  Filled 2016-09-14: qty 1

## 2016-09-14 MED ORDER — LIDOCAINE 2% (20 MG/ML) 5 ML SYRINGE
INTRAMUSCULAR | Status: DC | PRN
  Administered 2016-09-14: 100 mg via INTRAVENOUS

## 2016-09-14 MED ORDER — PROPOFOL 10 MG/ML IV BOLUS
INTRAVENOUS | Status: DC | PRN
  Administered 2016-09-14: 150 mg via INTRAVENOUS

## 2016-09-14 MED ORDER — FENTANYL CITRATE (PF) 250 MCG/5ML IJ SOLN
INTRAMUSCULAR | Status: AC
  Filled 2016-09-14: qty 5

## 2016-09-14 MED ORDER — MIDAZOLAM HCL 2 MG/2ML IJ SOLN
INTRAMUSCULAR | Status: DC | PRN
  Administered 2016-09-14: 2 mg via INTRAVENOUS

## 2016-09-14 MED ORDER — LIDOCAINE 2% (20 MG/ML) 5 ML SYRINGE
INTRAMUSCULAR | Status: AC
  Filled 2016-09-14: qty 5

## 2016-09-14 MED ORDER — FENTANYL CITRATE (PF) 100 MCG/2ML IJ SOLN
INTRAMUSCULAR | Status: AC
  Filled 2016-09-14: qty 2

## 2016-09-14 MED ORDER — PHENYLEPHRINE 40 MCG/ML (10ML) SYRINGE FOR IV PUSH (FOR BLOOD PRESSURE SUPPORT)
PREFILLED_SYRINGE | INTRAVENOUS | Status: AC
  Filled 2016-09-14: qty 20

## 2016-09-14 MED ORDER — FENTANYL CITRATE (PF) 250 MCG/5ML IJ SOLN
INTRAMUSCULAR | Status: DC | PRN
  Administered 2016-09-14: 100 ug via INTRAVENOUS

## 2016-09-14 MED ORDER — LACTATED RINGERS IV SOLN
INTRAVENOUS | Status: DC
  Administered 2016-09-14: 10:00:00 via INTRAVENOUS

## 2016-09-14 SURGICAL SUPPLY — 44 items
ADH SKN CLS APL DERMABOND .7 (GAUZE/BANDAGES/DRESSINGS) ×1
BINDER BREAST XLRG (GAUZE/BANDAGES/DRESSINGS) ×2 IMPLANT
BLADE SURG 15 STRL LF DISP TIS (BLADE) ×1 IMPLANT
BLADE SURG 15 STRL SS (BLADE) ×3
CANISTER SUCT 3000ML PPV (MISCELLANEOUS) ×3 IMPLANT
CHLORAPREP W/TINT 26ML (MISCELLANEOUS) ×3 IMPLANT
CLIP VESOCCLUDE SM WIDE 6/CT (CLIP) ×3 IMPLANT
CONT SPEC 4OZ CLIKSEAL STRL BL (MISCELLANEOUS) ×2 IMPLANT
COVER PROBE W GEL 5X96 (DRAPES) ×3 IMPLANT
COVER SURGICAL LIGHT HANDLE (MISCELLANEOUS) ×3 IMPLANT
DECANTER SPIKE VIAL GLASS SM (MISCELLANEOUS) ×2 IMPLANT
DERMABOND ADVANCED (GAUZE/BANDAGES/DRESSINGS) ×2
DERMABOND ADVANCED .7 DNX12 (GAUZE/BANDAGES/DRESSINGS) ×1 IMPLANT
DEVICE DUBIN SPECIMEN MAMMOGRA (MISCELLANEOUS) ×3 IMPLANT
DRAPE CHEST BREAST 15X10 FENES (DRAPES) ×3 IMPLANT
DRAPE UTILITY XL STRL (DRAPES) ×3 IMPLANT
DRSG PAD ABDOMINAL 8X10 ST (GAUZE/BANDAGES/DRESSINGS) ×3 IMPLANT
ELECT COATED BLADE 2.86 ST (ELECTRODE) ×3 IMPLANT
ELECT REM PT RETURN 9FT ADLT (ELECTROSURGICAL) ×3
ELECTRODE REM PT RTRN 9FT ADLT (ELECTROSURGICAL) ×1 IMPLANT
GLOVE BIO SURGEON STRL SZ7 (GLOVE) ×2 IMPLANT
GLOVE BIOGEL PI IND STRL 8 (GLOVE) ×1 IMPLANT
GLOVE BIOGEL PI INDICATOR 8 (GLOVE) ×2
GLOVE ECLIPSE 7.5 STRL STRAW (GLOVE) ×5 IMPLANT
GOWN STRL REUS W/ TWL LRG LVL3 (GOWN DISPOSABLE) ×1 IMPLANT
GOWN STRL REUS W/ TWL XL LVL3 (GOWN DISPOSABLE) ×1 IMPLANT
GOWN STRL REUS W/TWL LRG LVL3 (GOWN DISPOSABLE) ×3
GOWN STRL REUS W/TWL XL LVL3 (GOWN DISPOSABLE) ×3
KIT BASIN OR (CUSTOM PROCEDURE TRAY) ×3 IMPLANT
KIT MARKER MARGIN INK (KITS) ×3 IMPLANT
NDL HYPO 25GX1X1/2 BEV (NEEDLE) ×1 IMPLANT
NEEDLE HYPO 25GX1X1/2 BEV (NEEDLE) ×3 IMPLANT
NS IRRIG 1000ML POUR BTL (IV SOLUTION) ×3 IMPLANT
PACK SURGICAL SETUP 50X90 (CUSTOM PROCEDURE TRAY) ×3 IMPLANT
PENCIL BUTTON HOLSTER BLD 10FT (ELECTRODE) ×3 IMPLANT
SPONGE LAP 18X18 X RAY DECT (DISPOSABLE) ×3 IMPLANT
SUT MON AB 5-0 PS2 18 (SUTURE) ×3 IMPLANT
SUT VIC AB 3-0 SH 18 (SUTURE) ×3 IMPLANT
SYR BULB 3OZ (MISCELLANEOUS) ×3 IMPLANT
SYR CONTROL 10ML LL (SYRINGE) ×3 IMPLANT
TOWEL OR 17X24 6PK STRL BLUE (TOWEL DISPOSABLE) ×3 IMPLANT
TUBE CONNECTING 12'X1/4 (SUCTIONS) ×1
TUBE CONNECTING 12X1/4 (SUCTIONS) ×2 IMPLANT
YANKAUER SUCT BULB TIP NO VENT (SUCTIONS) ×3 IMPLANT

## 2016-09-14 NOTE — Discharge Instructions (Signed)
[facility] °Office Phone Number [phone] ° °BREAST BIOPSY/ PARTIAL MASTECTOMY: POST OP INSTRUCTIONS ° °Always review your discharge instruction sheet given to you by the facility where your surgery was performed. ° °IF YOU HAVE DISABILITY OR FAMILY LEAVE FORMS, YOU MUST BRING THEM TO THE OFFICE FOR PROCESSING.  DO NOT GIVE THEM TO YOUR DOCTOR. ° °1. A prescription for pain medication may be given to you upon discharge.  Take your pain medication as prescribed, if needed.  If narcotic pain medicine is not needed, then you may take acetaminophen (Tylenol) or ibuprofen (Advil) as needed. °2. Take your usually prescribed medications unless otherwise directed °3. If you need a refill on your pain medication, please contact your pharmacy.  They will contact our office to request authorization.  Prescriptions will not be filled after 5pm or on week-ends. °4. You should eat very light the first 24 hours after surgery, such as soup, crackers, pudding, etc.  Resume your normal diet the day after surgery. °5. Most patients will experience some swelling and bruising in the breast.  Ice packs and a good support bra will help.  Swelling and bruising can take several days to resolve.  °6. It is common to experience some constipation if taking pain medication after surgery.  Increasing fluid intake and taking a stool softener will usually help or prevent this problem from occurring.  A mild laxative (Milk of Magnesia or Miralax) should be taken according to package directions if there are no bowel movements after 48 hours. °7. Unless discharge instructions indicate otherwise, you may remove your bandages 24-48 hours after surgery, and you may shower at that time.  You may have steri-strips (small skin tapes) in place directly over the incision.  These strips should be left on the skin for 7-10 days.  If your surgeon used skin glue on the incision, you may shower in 24 hours.  The glue will flake off over the  next 2-3 weeks.  Any sutures or staples will be removed at the office during your follow-up visit. °8. ACTIVITIES:  You may resume regular daily activities (gradually increasing) beginning the next day.  Wearing a good support bra or sports bra minimizes pain and swelling.  You may have sexual intercourse when it is comfortable. °a. You may drive when you no longer are taking prescription pain medication, you can comfortably wear a seatbelt, and you can safely maneuver your car and apply brakes. °b. RETURN TO WORK:  ______________________________________________________________________________________ °9. You should see your doctor in the office for a follow-up appointment approximately two weeks after your surgery.  Your doctor’s nurse will typically make your follow-up appointment when she calls you with your pathology report.  Expect your pathology report 2-3 business days after your surgery.  You may call to check if you do not hear from us after three days. °10. OTHER INSTRUCTIONS: _______________________________________________________________________________________________ _____________________________________________________________________________________________________________________________________ °_____________________________________________________________________________________________________________________________________ °_____________________________________________________________________________________________________________________________________ ° °WHEN TO CALL YOUR DOCTOR: °1. Fever over 101.0 °2. Nausea and/or vomiting. °3. Extreme swelling or bruising. °4. Continued bleeding from incision. °5. Increased pain, redness, or drainage from the incision. ° °The clinic staff is available to answer your questions during regular business hours.  Please don’t hesitate to call and ask to speak to one of the nurses for clinical concerns.  If you have a medical emergency, go to the nearest  emergency room or call 911.  A surgeon from [facility] is always on call at the hospital. ° °For further questions, please visit [email or web address]  °

## 2016-09-14 NOTE — Anesthesia Postprocedure Evaluation (Signed)
Anesthesia Post Note  Patient: Tabitha [name]  Procedure(s) Performed: Procedure(s) (LRB): BREAST LUMPECTOMY WITH RADIOACTIVE SEED LOCALIZATION (Right)     Patient location during evaluation: PACU Anesthesia Type: General Level of consciousness: awake Pain management: pain level controlled Vital Signs Assessment: post-procedure vital signs reviewed and stable Respiratory status: spontaneous breathing Cardiovascular status: stable Anesthetic complications: no    Last Vitals:  Vitals:   09/14/16 1430 09/14/16 1706  BP: 128/75 (!) 141/83  Pulse: 68 84  Resp: 13   Temp:      Last Pain:  Vitals:   09/14/16 1706  TempSrc:   PainSc: 7                  Ginette Bradway

## 2016-09-14 NOTE — Interval H&P Note (Signed)
History and Physical Interval Note:  09/14/2016 12:17 PM  Tabitha [name]  has presented today for surgery, with the diagnosis of RIGHT BREAST CANCER  The various methods of treatment have been discussed with the patient and family. After consideration of risks, benefits and other options for treatment, the patient has consented to  Procedure(s): BREAST LUMPECTOMY WITH RADIOACTIVE SEED LOCALIZATION (Right) as a surgical intervention .  The patient's history has been reviewed, patient examined, no change in status, stable for surgery.  I have reviewed the patient's chart and labs.  Questions were answered to the patient's satisfaction.     Debbie Bellucci T

## 2016-09-14 NOTE — Transfer of Care (Signed)
Immediate Anesthesia Transfer of Care Note  Patient: Tabitha [name]  Procedure(s) Performed: Procedure(s): BREAST LUMPECTOMY WITH RADIOACTIVE SEED LOCALIZATION (Right)  Patient Location: PACU  Anesthesia Type:General  Level of Consciousness: drowsy and patient cooperative  Airway & Oxygen Therapy: Patient Spontanous Breathing and Patient connected to face mask oxygen  Post-op Assessment: Report given to RN and Post -op Vital signs reviewed and stable  Post vital signs: Reviewed and stable  Last Vitals:  Vitals:   09/14/16 1046 09/14/16 1050  BP: 126/66 119/70  Pulse: 66 64  Resp: (!) 23 16  Temp:      Last Pain:  Vitals:   09/14/16 1046  TempSrc:   PainSc: 4       Patients Stated Pain Goal: 0 (51/02/58 5277)  Complications: No apparent anesthesia complications

## 2016-09-14 NOTE — Op Note (Signed)
Preoperative Diagnosis: RIGHT BREAST CANCER  Postoprative Diagnosis: RIGHT BREAST CANCER  Procedure: Procedure(s): BREAST LUMPECTOMY WITH RADIOACTIVE SEED LOCALIZATION   Surgeon: Excell Seltzer T   Assistants: None  Anesthesia:  General endotracheal anesthesia  Indications: The patient is a 61 year old female who presents with breast cancer. She is a post menopausal female referred by Dr. Malka So for evaluation of recently diagnosed carcinoma of the right breast. She recently presented for a screening mamogram revealing possible asymmetry in the right breast. Subsequent imaging included diagnostic mamogram showing a 7 mm irregular mass in the inferior right breast seen on the MLO view only. Ultrasound was performed showing no ultrasound correlate and no apparent adenopathy. A stereotactic guided biopsywas performed on 06/08/2016 with pathology revealing invasive ductal carcinoma of the breast. She is seen now in breast multidisciplinary clinic for initial treatment planning. She has experienced no breast symptoms, specifically lump, skin changes or nipple discharge. Findings at that time were the following:  Tumor size: 0.7 cm Tumor grade: 2, Ki-67 10% Estrogen Receptor: +100% Progesterone Receptor: +95% Her-2 neu: negative Lymph node status: negative  She developed a post biopsy hematoma with apparent significant displacement of the marking clip. Therefore at the time of lumpectomy was recommended that she have a wire localization rather than see localization. At the time of localization the mass was felt to only be seen on one view. I performed a wire localized lumpectomy with the mass apparently seen in the specimen but final pathology showed no tumor nor any post biopsy change. This was all reviewed with Dr. Purcell Nails and we felt that quite possibly the small mass seen at the time of her localization which was difficult to visualize was not the tumor but possibly an  area of scar fibrocystic change. I would have to assume that the tumor remains based on the pathology report.  We obtained an MRI.There is seen a post-excision hematoma but a separate small hematoma with in the lower outer right breast separate from the surgical hematoma which is thought to represent residual hematoma from the original stereotactic biopsy with a clip artifact along the medial aspect of the hematoma. No suspicious enhancement or mass was seen. The small more lateral inferior hematoma was felt to be the most likely site of any residual carcinoma and it was recommended that any further surgery should target the marking clip. Subsequently a mammogram was also obtained which was negative  Following accurate radioactive seed placement at the site of the original biopsy clip we plan to proceed with radioactive seed localized lumpectomy.    Procedure Detail:  Patient was brought to the operating room, placed in the supine position on the operating table, and general endotracheal anesthesia induced. The right breast was widely sterilely prepped and draped. She received preoperative IV antibiotics. PAS were in place. Patient timeout was performed and correct procedure verified. The neoprobe was used to localize the seed in the inferior central right breast. The previous circumareolar inferior incision was used to dissect was carried down through the subcutaneous tissue and into the breast tissue. Using the neoprobe for guidance and dissection was deepened down toward the seed. As I got closer to the seed the dissection was widened out in all directions and using cautery a generous specimen of breast tissue was excised around the area of high counts. On the superior edge of the dissection I encountered the previous lumpectomy cavity and wall and a small portion of the inferior wall of the previous lumpectomy cavity  was included in the specimen superior edge. As the dissection deepened I was able  to feel a small mass within the specimen consistent with the previous biopsy cavity. The dissection was taken down to essentially the chest wall and the specimen removed. The specimen was inked for margins and specimen x-ray obtained showing the seed and a marking clip centrally located within the specimen although a little bit closer to the superior edge. This would be expected from the previous dissection. I did excise some more of the inferior wall of the previous lumpectomy cavity which would be further superior margin of this new dissection. This was sent as a separate specimen. Hemostasis was obtained with cautery and a couple of figure-of-eight sutures of Vicryl. The dissection area was marked with several clips. The deep breast and subcutaneous tissue was closed with interrupted 3-0 Vicryl and the skin with running septic or 5-0 Monocryl and Dermabond. Sponge needle and instrument counts were correct.    Findings: As above  Estimated Blood Loss:  less than 50 mL         Drains: None  Blood Given: none          Specimens: #1 right breast lumpectomy #2 further superior margin unoriented        Complications:  * No complications entered in OR log *         Disposition: PACU - hemodynamically stable.         Condition: stable

## 2016-09-14 NOTE — Anesthesia Preprocedure Evaluation (Addendum)
Anesthesia Evaluation  Patient identified by MRN, date of birth, ID band Patient awake    Reviewed: Allergy & Precautions, NPO status , Patient's Chart, lab work & pertinent test results  Airway Mallampati: II  TM Distance: >3 FB     Dental   Pulmonary Current Smoker,    breath sounds clear to auscultation       Cardiovascular hypertension,  Rhythm:Regular Rate:Normal     Neuro/Psych  Headaches,    GI/Hepatic Neg liver ROS, GERD  ,  Endo/Other  diabetes  Renal/GU negative Renal ROS     Musculoskeletal   Abdominal   Peds  Hematology   Anesthesia Other Findings   Reproductive/Obstetrics                             Anesthesia Physical Anesthesia Plan  ASA: III  Anesthesia Plan: General   Post-op Pain Management:  Regional for Post-op pain   Induction: Intravenous  PONV Risk Score and Plan: 2 and Ondansetron and Dexamethasone  Airway Management Planned: LMA  Additional Equipment:   Intra-op Plan:   Post-operative Plan: Extubation in OR  Informed Consent: I have reviewed the patients History and Physical, chart, labs and discussed the procedure including the risks, benefits and alternatives for the proposed anesthesia with the patient or authorized representative who has indicated his/her understanding and acceptance.   Dental advisory given  Plan Discussed with: CRNA and Anesthesiologist  Anesthesia Plan Comments:        Anesthesia Quick Evaluation

## 2016-09-14 NOTE — Anesthesia Procedure Notes (Signed)
Procedure Name: Intubation Date/Time: 09/14/2016 12:57 PM Performed by: Mervyn Gay Pre-anesthesia Checklist: Patient identified, Patient being monitored, Timeout performed, Emergency Drugs available and Suction available Patient Re-evaluated:Patient Re-evaluated prior to induction Oxygen Delivery Method: Circle System Utilized Preoxygenation: Pre-oxygenation with 100% oxygen Induction Type: IV induction Ventilation: Mask ventilation without difficulty Laryngoscope Size: Miller and 3 Grade View: Grade I Tube type: Oral Tube size: 7.5 mm Number of attempts: 1 Airway Equipment and Method: Stylet Placement Confirmation: ETT inserted through vocal cords under direct vision,  positive ETCO2 and breath sounds checked- equal and bilateral Secured at: 21 cm Tube secured with: Tape Dental Injury: Teeth and Oropharynx as per pre-operative assessment

## 2016-09-15 ENCOUNTER — Encounter (HOSPITAL_COMMUNITY): Payer: Self-pay | Admitting: General Surgery

## 2016-09-19 ENCOUNTER — Other Ambulatory Visit: Payer: Self-pay | Admitting: *Deleted

## 2016-09-19 DIAGNOSIS — Z170 Estrogen receptor positive status [ER+]: Principal | ICD-10-CM

## 2016-09-19 DIAGNOSIS — C50511 Malignant neoplasm of lower-outer quadrant of right female breast: Secondary | ICD-10-CM

## 2016-09-22 ENCOUNTER — Ambulatory Visit: Payer: PPO | Admitting: Hematology and Oncology

## 2016-09-28 NOTE — Progress Notes (Signed)
Location of Breast Cancer: Right Breast  Histology per Pathology Report: 06/08/16  Diagnosis Breast, right, needle core biopsy, lower outer quadrant - INVASIVE DUCTAL CARCINOMA.  Receptor Status: ER(100%), PR (95%), Her2-neu (NEG), Ki-(10%)  07/06/16 Diagnosis 1. Breast, lumpectomy, Right NEGATIVE FOR IN SITU OR INVASIVE DUCTAL CARCINOMA FIBROCYSTIC CHANGES WITH CALCIFICATION 2. Lymph node, sentinel, biopsy, Right Axillary ONE BENIGN LYMPH NODE (0/1) 3. Breast, excision, Right additional Superior Margin BENIGN BREAST TISSUE NEGATIVE FOR CARCINOMA  09/14/16 Diagnosis 1. Breast, lumpectomy, Right w/seed SMALL FOCUS OF RESIDUAL INVASIVE DUCTAL CARCINOMA, GRADE 1, (0.2 CM) PREVIOUS RESECTION SITE CHANGES ALL MARGINS OF RESECTION ARE NEGATIVE FOR CARCINOMA 2. Breast, excision, Right additional Superior Tissue BENIGN BREAST TISSUE WITH FAT NECROSIS NEGATIVE FOR CARCINOMA  Did patient present with symptoms or was this found on screening mammography?: It was found on a screening mammogram.   Past/Anticipated interventions by surgeon, if any: 07/06/16 Procedure: Procedure(s): Blue dye injection right breast, RIGHT BREAST LUMPECTOMY WITH RADIOACTIVE SEED AND [place] NODE BIOPSY Surgeon: Excell Seltzer T   09/14/16 Procedure: Procedure(s): BREAST LUMPECTOMY WITH RADIOACTIVE SEED LOCALIZATION Surgeon: Excell Seltzer T   Past/Anticipated interventions by medical oncology, if any:  07/13/16 Dr. Lindi Adie Recommendations: 1. Breast conserving surgery followed by 2. Oncotype DX testing if the final tumor size is significantly large. 3. Adjuvant radiation therapy followed by 4. Adjuvant antiestrogen therapy  Her next appointment with Dr. Lindi Adie is today 10/03/16  She is currently taking Anastrozole.   Lymphedema issues, if any: She denies. She has good arm mobility.   Pain issues, if any:  She denies breast pain. She takes oxycodone daily for pancreatitis as  recommended by her doctor.   SAFETY ISSUES:  Prior radiation? No  Pacemaker/ICD? No  Possible current pregnancy? No  Is the patient on methotrexate? No  Current Complaints / other details:   BP 136/80   Pulse 71   Temp 98.2 F (36.8 C)   Ht '5\' 4"'  (1.626 m)   Wt 186 lb 3.2 oz (84.5 kg)   SpO2 98% Comment: room air  BMI 31.96 kg/m    Wt Readings from Last 3 Encounters:  10/03/16 186 lb 3.2 oz (84.5 kg)  09/14/16 194 lb (88 kg)  09/05/16 189 lb 3.2 oz (85.8 kg)      Jalayla Chrismer, Stephani Police, RN 09/28/2016,12:28 PM

## 2016-09-29 ENCOUNTER — Encounter: Payer: Self-pay | Admitting: *Deleted

## 2016-09-29 NOTE — Progress Notes (Signed)
[facility] Work  Clinical Social Work was referred by patient for assessment of psychosocial needs due to financial concerns as she is starting radiation treatment soon and her husband was recently diagnosed with colon cancer. CSW introduced self, explained role of CSW/Pt and Family Support Team, support groups and other resources to assist.  Clinical Social Worker reviewed possible resources to assist; [employer], cancer center grant for husband, [place], breast cancer foundations and Duanne Limerick. Pt plans to call Cancer Care and request application and pt plans to reach out to Stefanie Libel, financial advocate when treatment plans are established. Based on income pt disclosed to [place], both should qualify for grants and many assistance programs. CSW also encouraged her to reach out to [place] for additional resource assistance.    Clinical Social Work interventions:  Resource education and referral Supportive listening  Loren Racer, LCSW, OSW-C Clinical Social Worker [facility] Phone: [phone] Fax: [phone]

## 2016-10-03 ENCOUNTER — Ambulatory Visit (HOSPITAL_BASED_OUTPATIENT_CLINIC_OR_DEPARTMENT_OTHER): Payer: PPO | Admitting: Hematology and Oncology

## 2016-10-03 ENCOUNTER — Encounter: Payer: Self-pay | Admitting: *Deleted

## 2016-10-03 ENCOUNTER — Ambulatory Visit
Admission: RE | Admit: 2016-10-03 | Discharge: 2016-10-03 | Disposition: A | Discharge status: Home / Self Care | Payer: PPO | Source: Ambulatory Visit | Attending: Radiation Oncology | Admitting: Radiation Oncology

## 2016-10-03 ENCOUNTER — Encounter: Payer: Self-pay | Admitting: Radiation Oncology

## 2016-10-03 ENCOUNTER — Encounter: Payer: Self-pay | Admitting: Hematology and Oncology

## 2016-10-03 ENCOUNTER — Telehealth: Payer: Self-pay

## 2016-10-03 DIAGNOSIS — Z170 Estrogen receptor positive status [ER+]: Secondary | ICD-10-CM | POA: Diagnosis not present

## 2016-10-03 DIAGNOSIS — C50511 Malignant neoplasm of lower-outer quadrant of right female breast: Secondary | ICD-10-CM | POA: Diagnosis not present

## 2016-10-03 DIAGNOSIS — Z79899 Other long term (current) drug therapy: Secondary | ICD-10-CM | POA: Insufficient documentation

## 2016-10-03 DIAGNOSIS — Z9889 Other specified postprocedural states: Secondary | ICD-10-CM | POA: Diagnosis not present

## 2016-10-03 NOTE — Progress Notes (Addendum)
Radiation Oncology         [phone] ________________________________  Name: ISHANA BLADES MRN: [id number]  Date: 10/03/2016  DOB: 09-13-1955  Follow-Up Visit Note  Outpatient  CC: Neale Burly, MD  Nicholas Lose, MD  Diagnosis:      ICD-10-CM   1. Malignant neoplasm of lower-outer quadrant of right breast of female, estrogen receptor positive ([place]) C50.511 Ambulatory referral to Radiation Oncology   Z17.0    ,Cancer Staging Malignant neoplasm of lower-outer quadrant of right breast of female, estrogen receptor positive ([place]) Staging form: Breast, AJCC 8th Edition - Clinical stage from 06/15/2016: Stage IA (cT1b, cN0, cM0, G2, ER: Positive, PR: Positive, HER2: Negative) - Unsigned Staging comments: Staged at breast conference on 5.9.18  pT1a pN0 M0 Right breast cancer ER+/ PR + HER2 negative Grade 1   CHIEF COMPLAINT: Here to discuss management of right breast cancer  Narrative:  The patient returns today for follow-up.     Since consultation, she underwent lumpectomy and SLN biopsy by Dr Excell Seltzer on 07-06-16.  This revealed no carcinoma in the lumpectomy specimen and one benign node. It was determined that the actual tumor was likely missed.  Lumpectomy on 09-14-16 revealed 0.2cm tumor (invasive ductal carcinoma) with negative margins, Grade 1.    She has been discussed at tumor board.  No plans for chemotherapy, but she will discuss continuing anti estrogens today with Dr. Lindi Adie. On anastrozole.  She lives in [place], [place].  Her husband was recently dx'd with advanced Colon cancer.  She is still smoking despite our discussion months ago about quitting. She feels she cannot set a quit date now due to stress.           ALLERGIES:  is allergic to ampicillin; barium-containing compounds; penicillins; and morphine and related.  Meds: Current Outpatient Prescriptions  Medication Sig Dispense Refill  . acetaminophen (TYLENOL) 325 MG tablet Take 325 mg by mouth 3 (three) times  daily.    Marland Kitchen alendronate (FOSAMAX) 70 MG tablet Take 70 mg by mouth every Monday. Take with a full glass of water on an empty stomach.     . ALPRAZolam (XANAX) 0.5 MG tablet Take 0.5 mg by mouth at bedtime as needed for anxiety or sleep.     Marland Kitchen amLODipine (NORVASC) 5 MG tablet Take 5 mg by mouth daily.    Marland Kitchen anastrozole (ARIMIDEX) 1 MG tablet Take 1 tablet (1 mg total) by mouth daily. (Patient taking differently: Take 1 mg by mouth every evening. ) 30 tablet 6  . atorvastatin (LIPITOR) 20 MG tablet Take 20 mg by mouth every evening.     Marland Kitchen lisinopril (PRINIVIL,ZESTRIL) 40 MG tablet Take 40 mg by mouth daily.    . metFORMIN (GLUCOPHAGE) 500 MG tablet Take 500 mg by mouth 2 (two) times daily.     . Oxycodone HCl 10 MG TABS Take 10 mg by mouth 3 (three) times daily.     . Pancrelipase, Lip-Prot-Amyl, (CREON) 24000-76000 units CPEP Take 2 capsules by mouth 3 (three) times daily before meals.     . triamterene-hydrochlorothiazide (MAXZIDE) 75-50 MG tablet Take 1 tablet by mouth daily.     No current facility-administered medications for this encounter.     Physical Findings:  height is '5\' 4"'  (1.626 m) and weight is 186 lb 3.2 oz (84.5 kg). Her temperature is 98.2 F (36.8 C). Her blood pressure is 136/80 and her pulse is 71. Her oxygen saturation is 98%. .     General:  Alert and oriented, in no acute distress Further exam deferred  Lab Findings: Lab Results  Component Value Date   WBC 10.7 (H) 09/05/2016   HGB 15.2 (H) 09/05/2016   HCT 44.7 09/05/2016   MCV 86.0 09/05/2016   PLT 247 09/05/2016       Radiographic Findings: Mm Breast Surgical Specimen  Result Date: 09/14/2016 CLINICAL DATA:  Status post lumpectomy today after earlier radioactive seed localization. EXAM: SPECIMEN RADIOGRAPH OF THE RIGHT BREAST COMPARISON:  Previous exam(s). FINDINGS: Status post excision of the right breast. The radioactive seed and biopsy marker clip are present, completely intact, and were marked for  pathology. The positions of the radioactive seed and biopsy marker clip within the specimen were discussed with the OR staff during the procedure. IMPRESSION: Specimen radiograph of the right breast. Electronically Signed   By: Franki Cabot M.D.   On: 09/14/2016 13:53   Mm Diag Breast Tomo Uni Right  Result Date: 09/05/2016 CLINICAL DATA:  Patient presents for evaluation of the right breast. Recent wire localized lumpectomy of the right breast demonstrated no malignancy on pathology. MRI demonstrates a persistent hematoma along the lower outer right breast with clip artifact along the medial aspect of this hematoma, felt to represent the most likely site of any residual malignancy. Evaluate location of tissue marker clip. EXAM: 2D DIGITAL DIAGNOSTIC UNILATERAL RIGHT MAMMOGRAM WITH CAD AND ADJUNCT TOMO COMPARISON:  Multiple prior studies including MRI 07/31/2016 ACR Breast Density Category b: There are scattered areas of fibroglandular density. FINDINGS: Surgical clips are identified in the posterior central portion of the right breast. Along the posterior central aspect of the right breast, there is a coil shaped tissue marker clip, along the anteromedial and inferior aspect of surgical changes. The original mass is not well seen. Mammographic images were processed with CAD. IMPRESSION: Persistence of tissue marker clip within the posterior central aspect of the right breast, amenable to seed localization. RECOMMENDATION: Patient is scheduled for seed localization on 09/12/2016. I have discussed the findings and recommendations with the patient. Results were also provided in writing at the conclusion of the visit. If applicable, a reminder letter will be sent to the patient regarding the next appointment. BI-RADS CATEGORY  6: Known biopsy-proven malignancy. Electronically Signed   By: Nolon Nations M.D.   On: 09/05/2016 10:56   Mm Rt Radioactive Seed Loc Mammo Guide  Result Date: 09/12/2016 CLINICAL DATA:   61 year old female presenting for radioactive seed localization of a right breast mass with clip. EXAM: MAMMOGRAPHIC GUIDED RADIOACTIVE SEED LOCALIZATION OF THE RIGHT BREAST COMPARISON:  Previous exam(s). FINDINGS: Patient presents for radioactive seed localization prior to right breast lumpectomy. I met with the patient and we discussed the procedure of seed localization including benefits and alternatives. We discussed the high likelihood of a successful procedure. We discussed the risks of the procedure including infection, bleeding, tissue injury and further surgery. We discussed the low dose of radioactivity involved in the procedure. Informed, written consent was given. The usual time-out protocol was performed immediately prior to the procedure. Using mammographic guidance, sterile technique, 1% lidocaine and an I-125 radioactive seed, the coil shaped biopsy marking clip in the lower outer quadrant of the right breast was localized using a lateral approach. The follow-up mammogram images confirm the seed in the expected location and were marked for Dr. Excell Seltzer. Follow-up survey of the patient confirms presence of the radioactive seed. Order number of I-125 seed:  [id number]. Total activity:  3.976 millicuries  Reference Date: 09/01/2016  The patient tolerated the procedure well and was released from the [facility]. She was given instructions regarding seed removal. IMPRESSION: Radioactive seed localization right breast. No apparent complications. Electronically Signed   By: Ammie Ferrier M.D.   On: 09/12/2016 13:38    Impression/Plan: right breast cancer  We discussed adjuvant radiotherapy today.  I recommend 3.5-4 weeks of hypo fractionated radiotherapy to the right breast in order to reduce risk of locoregional recurrence by 2/3.  She lives in [place] and I think her needs will be well served by the excellent team up there.  She is enthusiastic to meet them and proceed with treatment close to  home.  I'll make that referral.  No follow-up needed in my clinic. I wished her the best  I spent 10 minutes face to face with the patient and more than 50% of that time was spent in counseling and/or coordination of care. _____________________________________   Eppie Gibson, MD

## 2016-10-03 NOTE — Progress Notes (Signed)
Patient Care Team: Neale Burly, MD as PCP - General (Internal Medicine) Excell Seltzer, MD as Consulting Physician (General Surgery) Nicholas Lose, MD as Consulting Physician (Hematology and Oncology) Eppie Gibson, MD as Attending Physician (Radiation Oncology)  DIAGNOSIS:  Encounter Diagnosis  Name Primary?  . Malignant neoplasm of lower-outer quadrant of right breast of female, estrogen receptor positive ([place])     SUMMARY OF ONCOLOGIC HISTORY:   Malignant neoplasm of lower-outer quadrant of right breast of female, estrogen receptor positive ([place])   06/08/2016 Initial Diagnosis    Right breast asymmetry posteriorly 7 mm size, no ultrasound correlate, AFFIRM biopsy: Grade 2 invasive ductal carcinoma ER 100%, PR 95%, Ki-67 10%, HER-2 negative ratio 1.58, T1 BN 0 stage IA clinical stage      07/06/2016 Surgery    Right lumpectomy: No tumor noted on the resected specimen      09/14/2016 Surgery    Right lumpectomy: Small residual focus of IDC grade 1, 0.2 cm, margins negative, ER 100%, PR 95%, HER-2 negative, Ki-67 10%, T1a N0 stage IA       CHIEF COMPLIANT: Follow-up after recent second right lumpectomy  INTERVAL HISTORY: Tabitha [name] is a 61 year old with above-mentioned history of right breast cancer who underwent initial lumpectomy in 07/06/2016. At that time there was no tumor noted in the resected specimen. She underwent further extensive evaluation and finally underwent a second lumpectomy on 09/14/2016. The small focus of grade 1 invasive ductal carcinoma measuring 0.2 cm was identified. She was sent to [place] for further discussion regarding adjuvant treatment options.  REVIEW OF SYSTEMS:   Constitutional: Denies fevers, chills or abnormal weight loss Eyes: Denies blurriness of vision Ears, nose, mouth, throat, and face: Denies mucositis or sore throat Respiratory: Denies cough, dyspnea or wheezes Cardiovascular: Denies palpitation, chest  discomfort Gastrointestinal:  Denies nausea, heartburn or change in bowel habits Skin: Denies abnormal skin rashes Lymphatics: Denies new lymphadenopathy or easy bruising Neurological:Denies numbness, tingling or new weaknesses Behavioral/Psych: Mood is stable, no new changes  Extremities: No lower extremity edema Breast:  Right lumpectomy All other systems were reviewed with the patient and are negative.  I have reviewed the past medical history, past surgical history, social history and family history with the patient and they are unchanged from previous note.  ALLERGIES:  is allergic to ampicillin; barium-containing compounds; penicillins; and morphine and related.  MEDICATIONS:  Current Outpatient Prescriptions  Medication Sig Dispense Refill  . acetaminophen (TYLENOL) 325 MG tablet Take 325 mg by mouth 3 (three) times daily.    Marland Kitchen alendronate (FOSAMAX) 70 MG tablet Take 70 mg by mouth every Monday. Take with a full glass of water on an empty stomach.     . ALPRAZolam (XANAX) 0.5 MG tablet Take 0.5 mg by mouth at bedtime as needed for anxiety or sleep.     Marland Kitchen amLODipine (NORVASC) 5 MG tablet Take 5 mg by mouth daily.    Marland Kitchen anastrozole (ARIMIDEX) 1 MG tablet Take 1 tablet (1 mg total) by mouth daily. (Patient taking differently: Take 1 mg by mouth every evening. ) 30 tablet 6  . atorvastatin (LIPITOR) 20 MG tablet Take 20 mg by mouth every evening.     Marland Kitchen lisinopril (PRINIVIL,ZESTRIL) 40 MG tablet Take 40 mg by mouth daily.    . metFORMIN (GLUCOPHAGE) 500 MG tablet Take 500 mg by mouth 2 (two) times daily.     . Oxycodone HCl 10 MG TABS Take 10 mg by mouth 3 (three) times daily.     Marland Kitchen  Pancrelipase, Lip-Prot-Amyl, (CREON) 24000-76000 units CPEP Take 2 capsules by mouth 3 (three) times daily before meals.     . triamterene-hydrochlorothiazide (MAXZIDE) 75-50 MG tablet Take 1 tablet by mouth daily.     No current facility-administered medications for this visit.     PHYSICAL  EXAMINATION: ECOG PERFORMANCE STATUS: 1 - Symptomatic but completely ambulatory  Vitals:   10/03/16 1149  BP: 135/76  Pulse: 81  Resp: 18  Temp: 98 F (36.7 C)  SpO2: 99%   Filed Weights   10/03/16 1149  Weight: 190 lb 4.8 oz (86.3 kg)    GENERAL:alert, no distress and comfortable SKIN: skin color, texture, turgor are normal, no rashes or significant lesions EYES: normal, Conjunctiva are pink and non-injected, sclera clear OROPHARYNX:no exudate, no erythema and lips, buccal mucosa, and tongue normal  NECK: supple, thyroid normal size, non-tender, without nodularity LYMPH:  no palpable lymphadenopathy in the cervical, axillary or inguinal LUNGS: clear to auscultation and percussion with normal breathing effort HEART: regular rate & rhythm and no murmurs and no lower extremity edema ABDOMEN:abdomen soft, non-tender and normal bowel sounds MUSCULOSKELETAL:no cyanosis of digits and no clubbing  NEURO: alert & oriented x 3 with fluent speech, no focal motor/sensory deficits EXTREMITIES: No lower extremity edema  LABORATORY DATA:  I have reviewed the data as listed   Chemistry      Component Value Date/Time   NA 138 09/05/2016 1140   NA 141 06/15/2016 0852   K 4.2 09/05/2016 1140   K 4.2 06/15/2016 0852   CL 106 09/05/2016 1140   CO2 24 09/05/2016 1140   CO2 25 06/15/2016 0852   BUN 8 09/05/2016 1140   BUN 8.3 06/15/2016 0852   CREATININE 0.73 09/05/2016 1140   CREATININE 0.8 06/15/2016 0852      Component Value Date/Time   CALCIUM 9.4 09/05/2016 1140   CALCIUM 9.6 06/15/2016 0852   ALKPHOS 74 06/15/2016 0852   AST 29 06/15/2016 0852   ALT 40 06/15/2016 0852   BILITOT 0.71 06/15/2016 0852       Lab Results  Component Value Date   WBC 10.7 (H) 09/05/2016   HGB 15.2 (H) 09/05/2016   HCT 44.7 09/05/2016   MCV 86.0 09/05/2016   PLT 247 09/05/2016   NEUTROABS 5.9 06/15/2016    ASSESSMENT & PLAN:  Malignant neoplasm of lower-outer quadrant of right breast of  female, estrogen receptor positive ([place]) 09/14/2016 Right lumpectomy: Small residual focus of IDC grade 1, 0.2 cm, margins negative, ER 100%, PR 95%, HER-2 negative, Ki-67 10%, T1a N0 stage IA (07/05/2016: Breast conserving surgery: No tumor detected on the pathological evaluation.)  Pathology review: I discussed the final pathology report along with previously performed ER/PR receptors and the margins.  Recommendation: 1. There is no need for Oncotype DX testing 2. adjuvant radiation therapy followed by 3. Adjuvant antiestrogen therapy   Return to clinic at the end of radiation to start antiestrogen times  I spent 25 minutes talking to the patient of which more than half was spent in counseling and coordination of care.  No orders of the defined types were placed in this encounter.  The patient has a good understanding of the overall plan. she agrees with it. she will call with any problems that may develop before the next visit here.   Rulon Eisenmenger, MD 10/03/16

## 2016-10-03 NOTE — Telephone Encounter (Signed)
appts made and avs printed for patient 

## 2016-10-03 NOTE — Assessment & Plan Note (Signed)
09/14/2016 Right lumpectomy: Small residual focus of IDC grade 1, 0.2 cm, margins negative, ER 100%, PR 95%, HER-2 negative, Ki-67 10%, T1a N0 stage IA (07/05/2016: Breast conserving surgery: No tumor detected on the pathological evaluation.)  Pathology review: I discussed the final pathology report along with previously performed ER/PR receptors and the margins.  Recommendation: 1. There is no need for Oncotype DX testing 2. adjuvant radiation therapy followed by 3. Adjuvant antiestrogen therapy   Return to clinic at the end of radiation to start antiestrogen times

## 2016-10-03 NOTE — Progress Notes (Signed)
[place] [employer]  [employer] representative met with patient in [place] office at [facility] to review financial resources and offer additional support.  CSW and patient discussed financial resources including; Cancer care, [employer] [place] and [employer].  CSW encouraged patient to call cancer care to initiate application process.  CSW also provided contact information for Duanne Limerick and application for assistance.  CSw and patient reviewed [employer] [place] application.  Patient plans to contact CSW once she collects required documents.  CSw encouraged patient to call with any question or concerns.   Johnnye Lana, MSW, LCSW, OSW-C Clinical Social Worker [facility] [phone]

## 2016-10-05 ENCOUNTER — Telehealth: Payer: Self-pay | Admitting: Adult Health

## 2016-10-05 NOTE — Telephone Encounter (Signed)
Scheduled appt per 8/28 sch message from Humberto Leep - sent reminder letter in the mail for SCP appt.

## 2016-10-12 DIAGNOSIS — E861 Hypovolemia: Secondary | ICD-10-CM | POA: Diagnosis not present

## 2016-10-12 DIAGNOSIS — E1142 Type 2 diabetes mellitus with diabetic polyneuropathy: Secondary | ICD-10-CM | POA: Diagnosis not present

## 2016-10-12 DIAGNOSIS — E784 Other hyperlipidemia: Secondary | ICD-10-CM | POA: Diagnosis not present

## 2016-10-12 DIAGNOSIS — I10 Essential (primary) hypertension: Secondary | ICD-10-CM | POA: Diagnosis not present

## 2016-10-13 DIAGNOSIS — C50511 Malignant neoplasm of lower-outer quadrant of right female breast: Secondary | ICD-10-CM | POA: Diagnosis not present

## 2016-10-13 DIAGNOSIS — Z170 Estrogen receptor positive status [ER+]: Secondary | ICD-10-CM | POA: Diagnosis not present

## 2016-10-20 DIAGNOSIS — C50511 Malignant neoplasm of lower-outer quadrant of right female breast: Secondary | ICD-10-CM | POA: Diagnosis not present

## 2016-10-24 DIAGNOSIS — C50511 Malignant neoplasm of lower-outer quadrant of right female breast: Secondary | ICD-10-CM | POA: Diagnosis not present

## 2016-10-26 DIAGNOSIS — C50511 Malignant neoplasm of lower-outer quadrant of right female breast: Secondary | ICD-10-CM | POA: Diagnosis not present

## 2016-10-27 DIAGNOSIS — C50511 Malignant neoplasm of lower-outer quadrant of right female breast: Secondary | ICD-10-CM | POA: Diagnosis not present

## 2016-11-01 ENCOUNTER — Encounter (INDEPENDENT_AMBULATORY_CARE_PROVIDER_SITE_OTHER): Payer: Self-pay

## 2016-11-01 ENCOUNTER — Ambulatory Visit (INDEPENDENT_AMBULATORY_CARE_PROVIDER_SITE_OTHER): Payer: PPO | Admitting: Internal Medicine

## 2016-11-01 ENCOUNTER — Encounter (INDEPENDENT_AMBULATORY_CARE_PROVIDER_SITE_OTHER): Payer: Self-pay | Admitting: Internal Medicine

## 2016-11-01 VITALS — BP 108/78 | HR 72 | Temp 98.5°F | Resp 18 | Ht 64.0 in | Wt 185.6 lb

## 2016-11-01 DIAGNOSIS — R1013 Epigastric pain: Secondary | ICD-10-CM

## 2016-11-01 DIAGNOSIS — R197 Diarrhea, unspecified: Secondary | ICD-10-CM

## 2016-11-01 DIAGNOSIS — R600 Localized edema: Secondary | ICD-10-CM

## 2016-11-01 DIAGNOSIS — G8929 Other chronic pain: Secondary | ICD-10-CM

## 2016-11-01 MED ORDER — DICYCLOMINE HCL 10 MG PO CAPS: 10.0000 mg | ORAL_CAPSULE | Freq: Three times a day (TID) | ORAL | 1 refills | Status: DC | PRN

## 2016-11-01 NOTE — Patient Instructions (Addendum)
Stool test for C. difficile antigen and toxin. Venous Doppler study of right leg to be scheduled ASAP. Will request copy of endoscopic ultrasound report from [facility].

## 2016-11-01 NOTE — Progress Notes (Signed)
Presenting complaint;  Epigastric pain and diarrhea. Patient also complains of right leg edema.  Database and Subjective:  Patient is 61 year old Caucasian female who is in for scheduled visit. She was last seen in December 2017. She has history of chronic pancreatitis based on endoscopic ultrasound that she had at [facility] both 12 years ago. She was also found to have microlithiasis and underwent ERCP with sphincterotomy in March 2006. She has been on pancreatic enzyme supplements since then and she is also on pain medication. Lately she's been having more pain. Following her last visit in December 2017 she had MRCP and was negative for choledocholithiasis or changes of pancreatitis. She underwent esophagogastroduodenoscopy and febrile 2018. She was found to have H. pylori gastritis and was able to finish treatment. She was also treated with dicyclomine but she is not sure if it made any difference.  She continues to complain of epigastric pain. She says is constant and worse with meals but not associated with vomiting. She does have nausea. She has lost 8 pounds since her last visit. She feels weight loss may be secondary radiation therapy that she is receiving for right breast carcinoma which was diagnosed about 4 months ago. She had biopsy followed by lumpectomy and currently undergoing radiation therapy. Her new complaint is one of diarrhea which started about 5 or 6 days ago. She is having anywhere from 2-5 loose stools. She denies melena or rectal bleeding. She states she had mastitis and was treated with Bactrim for 7 days. She denies fever chills or night sweats. She drinks bottled and tap water. She has not taken any trips outside the country recently. Last colonoscopy was in December 2014 with removal of adenomas. She also complains of edema to right leg and foot which started few days ago. She says few years ago she had edema and had Doppler study was negative. She states she's never had this  much edema before. She denies calf pain shortness of breath or cough. She says her husband is recuperating from recent surgery for colon carcinoma. She says she would not be able to undergo Doppler studies today but will have one at [facility] tomorrow.   Current Medications: Outpatient Encounter Prescriptions as of 11/01/2016  Medication Sig  . acetaminophen (TYLENOL) 325 MG tablet Take 325 mg by mouth 3 (three) times daily.  Marland Kitchen alendronate (FOSAMAX) 70 MG tablet Take 70 mg by mouth every Monday. Take with a full glass of water on an empty stomach.   . ALPRAZolam (XANAX) 0.5 MG tablet Take 0.5 mg by mouth at bedtime as needed for anxiety or sleep.   Marland Kitchen amLODipine (NORVASC) 5 MG tablet Take 5 mg by mouth daily.  Marland Kitchen atorvastatin (LIPITOR) 20 MG tablet Take 20 mg by mouth every evening.   Marland Kitchen lisinopril (PRINIVIL,ZESTRIL) 40 MG tablet Take 40 mg by mouth daily.  . metFORMIN (GLUCOPHAGE) 500 MG tablet Take 500 mg by mouth 2 (two) times daily.   . Oxycodone HCl 10 MG TABS Take 10 mg by mouth 3 (three) times daily.   . Pancrelipase, Lip-Prot-Amyl, (CREON) 24000-76000 units CPEP Take 2 capsules by mouth 3 (three) times daily before meals.   . triamterene-hydrochlorothiazide (MAXZIDE) 75-50 MG tablet Take 1 tablet by mouth daily.  Marland Kitchen anastrozole (ARIMIDEX) 1 MG tablet Take 1 tablet (1 mg total) by mouth daily. (Patient not taking: Reported on 11/01/2016)   No facility-administered encounter medications on file as of 11/01/2016.      Objective: Blood pressure 108/78, pulse 72, temperature  98.5 F (36.9 C), temperature source Oral, resp. rate 18, height 5\' 4"  (1.626 m), weight 185 lb 9.6 oz (84.2 kg). Patient is alert and in no acute distress. Conjunctiva is pink. Sclera is nonicteric Oropharyngeal mucosa is normal. No neck masses or thyromegaly noted. Cardiac exam with regular rhythm normal S1 and S2. No murmur or gallop noted. Lungs are clear to auscultation. Abdomen is symmetrical. Bowel sounds are normal.  On palpation abdomen is soft. She has mild midepigastric tenderness. No organomegaly or masses. She has 2+ edema to right leg. She is cemented right foot as well. Homans sign is negative and there is no calf tenderness.   Assessment:  #1. Epigastric pain. She has chronic epigastric pain. She is on pain medication for abdominal pain as well as back pain. She was diagnosed with chronic pancreatitis based on EUS exam but MRCP in December 2017 revealed normal-appearing pancreas. I'm not sure that she needs to continue pancreatic enzyme supplement indefinitely.  #2. Recent onset of diarrhea. She does not appear to be acutely ill. She was recently treated with Bactrim. Therefore need to rule out C. difficile colitis.  #3. Right leg edema of recent onset. She does not have primary symptoms. She was recently begun on Arimidex. Therefore need to rule out DVT. Patient declined to have Doppler study today.  #4. History of colonic adenomas. Last exam was in December 2014 and next exam would be in December 2019.   Plan:  Request pancreatic EUS records from  [facility] and determine if she needs follow-up study. Stool tests for C. difficile antigen and toxin. Venous Doppler study of right leg. Dicyclomine 10 mg by mouth 3 times a day when necessary. Office visit on as-needed basis.

## 2016-11-03 DIAGNOSIS — C50511 Malignant neoplasm of lower-outer quadrant of right female breast: Secondary | ICD-10-CM | POA: Diagnosis not present

## 2016-11-03 DIAGNOSIS — E784 Other hyperlipidemia: Secondary | ICD-10-CM | POA: Diagnosis not present

## 2016-11-03 DIAGNOSIS — E1142 Type 2 diabetes mellitus with diabetic polyneuropathy: Secondary | ICD-10-CM | POA: Diagnosis not present

## 2016-11-03 DIAGNOSIS — Z6831 Body mass index (BMI) 31.0-31.9, adult: Secondary | ICD-10-CM | POA: Diagnosis not present

## 2016-11-03 DIAGNOSIS — K861 Other chronic pancreatitis: Secondary | ICD-10-CM | POA: Diagnosis not present

## 2016-11-07 DIAGNOSIS — Z170 Estrogen receptor positive status [ER+]: Secondary | ICD-10-CM | POA: Diagnosis not present

## 2016-11-07 DIAGNOSIS — C50511 Malignant neoplasm of lower-outer quadrant of right female breast: Secondary | ICD-10-CM | POA: Diagnosis not present

## 2016-11-11 DIAGNOSIS — C50511 Malignant neoplasm of lower-outer quadrant of right female breast: Secondary | ICD-10-CM | POA: Diagnosis not present

## 2016-11-15 DIAGNOSIS — C50511 Malignant neoplasm of lower-outer quadrant of right female breast: Secondary | ICD-10-CM | POA: Diagnosis not present

## 2016-11-18 DIAGNOSIS — E119 Type 2 diabetes mellitus without complications: Secondary | ICD-10-CM | POA: Diagnosis not present

## 2016-11-18 DIAGNOSIS — H40053 Ocular hypertension, bilateral: Secondary | ICD-10-CM | POA: Diagnosis not present

## 2016-11-18 DIAGNOSIS — H353191 Nonexudative age-related macular degeneration, unspecified eye, early dry stage: Secondary | ICD-10-CM | POA: Diagnosis not present

## 2016-11-18 DIAGNOSIS — H2513 Age-related nuclear cataract, bilateral: Secondary | ICD-10-CM | POA: Diagnosis not present

## 2016-11-21 DIAGNOSIS — C50511 Malignant neoplasm of lower-outer quadrant of right female breast: Secondary | ICD-10-CM | POA: Diagnosis not present

## 2016-12-06 ENCOUNTER — Other Ambulatory Visit: Payer: Self-pay | Admitting: Hematology and Oncology

## 2016-12-20 DIAGNOSIS — E7849 Other hyperlipidemia: Secondary | ICD-10-CM | POA: Diagnosis not present

## 2016-12-20 DIAGNOSIS — Z6831 Body mass index (BMI) 31.0-31.9, adult: Secondary | ICD-10-CM | POA: Diagnosis not present

## 2016-12-20 DIAGNOSIS — M545 Low back pain: Secondary | ICD-10-CM | POA: Diagnosis not present

## 2016-12-20 DIAGNOSIS — I10 Essential (primary) hypertension: Secondary | ICD-10-CM | POA: Diagnosis not present

## 2016-12-20 DIAGNOSIS — E1142 Type 2 diabetes mellitus with diabetic polyneuropathy: Secondary | ICD-10-CM | POA: Diagnosis not present

## 2016-12-20 DIAGNOSIS — K861 Other chronic pancreatitis: Secondary | ICD-10-CM | POA: Diagnosis not present

## 2016-12-27 DIAGNOSIS — Z170 Estrogen receptor positive status [ER+]: Secondary | ICD-10-CM | POA: Diagnosis not present

## 2016-12-27 DIAGNOSIS — C50511 Malignant neoplasm of lower-outer quadrant of right female breast: Secondary | ICD-10-CM | POA: Diagnosis not present

## 2017-01-17 DIAGNOSIS — E1142 Type 2 diabetes mellitus with diabetic polyneuropathy: Secondary | ICD-10-CM | POA: Diagnosis not present

## 2017-01-17 DIAGNOSIS — I10 Essential (primary) hypertension: Secondary | ICD-10-CM | POA: Diagnosis not present

## 2017-01-17 DIAGNOSIS — K861 Other chronic pancreatitis: Secondary | ICD-10-CM | POA: Diagnosis not present

## 2017-01-17 DIAGNOSIS — E7849 Other hyperlipidemia: Secondary | ICD-10-CM | POA: Diagnosis not present

## 2017-01-17 DIAGNOSIS — M545 Low back pain: Secondary | ICD-10-CM | POA: Diagnosis not present

## 2017-01-17 DIAGNOSIS — Z6831 Body mass index (BMI) 31.0-31.9, adult: Secondary | ICD-10-CM | POA: Diagnosis not present

## 2017-01-23 DIAGNOSIS — M545 Low back pain: Secondary | ICD-10-CM | POA: Diagnosis not present

## 2017-01-23 DIAGNOSIS — I10 Essential (primary) hypertension: Secondary | ICD-10-CM | POA: Diagnosis not present

## 2017-01-23 DIAGNOSIS — K861 Other chronic pancreatitis: Secondary | ICD-10-CM | POA: Diagnosis not present

## 2017-01-23 DIAGNOSIS — E1142 Type 2 diabetes mellitus with diabetic polyneuropathy: Secondary | ICD-10-CM | POA: Diagnosis not present

## 2017-01-23 DIAGNOSIS — E7849 Other hyperlipidemia: Secondary | ICD-10-CM | POA: Diagnosis not present

## 2017-02-03 DIAGNOSIS — R1084 Generalized abdominal pain: Secondary | ICD-10-CM | POA: Diagnosis not present

## 2017-02-03 DIAGNOSIS — Z6831 Body mass index (BMI) 31.0-31.9, adult: Secondary | ICD-10-CM | POA: Diagnosis not present

## 2017-02-04 DIAGNOSIS — K859 Acute pancreatitis without necrosis or infection, unspecified: Secondary | ICD-10-CM | POA: Diagnosis not present

## 2017-02-09 DIAGNOSIS — E7849 Other hyperlipidemia: Secondary | ICD-10-CM | POA: Diagnosis not present

## 2017-02-09 DIAGNOSIS — E1142 Type 2 diabetes mellitus with diabetic polyneuropathy: Secondary | ICD-10-CM | POA: Diagnosis not present

## 2017-02-09 DIAGNOSIS — I10 Essential (primary) hypertension: Secondary | ICD-10-CM | POA: Diagnosis not present

## 2017-03-01 ENCOUNTER — Telehealth: Payer: Self-pay

## 2017-03-01 NOTE — Telephone Encounter (Signed)
Spoke with pt to confirm her appt with Dr.Gudena and to inquire about survivorship clinic and if it is necessary for her to come. Explained the benefit and resources that she will be gaining from this appt. Pt decided to keep her Survivorship appt next week with Lindsey,NP. No further questions/concerns.

## 2017-03-01 NOTE — Telephone Encounter (Signed)
Left vm for patient reminding of SCP appt on 03/07/17 at 10 am.  Left number for call back with questions/concerns.

## 2017-03-07 ENCOUNTER — Inpatient Hospital Stay: Payer: PPO | Attending: Adult Health | Admitting: Adult Health

## 2017-03-07 ENCOUNTER — Encounter: Payer: Self-pay | Admitting: Adult Health

## 2017-03-07 ENCOUNTER — Telehealth: Payer: Self-pay | Admitting: Adult Health

## 2017-03-07 VITALS — BP 143/80 | HR 78 | Temp 97.9°F | Resp 18 | Ht 64.0 in | Wt 183.0 lb

## 2017-03-07 DIAGNOSIS — C50511 Malignant neoplasm of lower-outer quadrant of right female breast: Secondary | ICD-10-CM

## 2017-03-07 DIAGNOSIS — N6489 Other specified disorders of breast: Secondary | ICD-10-CM | POA: Diagnosis not present

## 2017-03-07 DIAGNOSIS — Z170 Estrogen receptor positive status [ER+]: Secondary | ICD-10-CM | POA: Diagnosis not present

## 2017-03-07 DIAGNOSIS — R21 Rash and other nonspecific skin eruption: Secondary | ICD-10-CM | POA: Diagnosis not present

## 2017-03-07 DIAGNOSIS — Z79811 Long term (current) use of aromatase inhibitors: Secondary | ICD-10-CM

## 2017-03-07 NOTE — Progress Notes (Signed)
CLINIC:  Survivorship   REASON FOR VISIT:  Routine follow-up post-treatment for a recent history of breast cancer.  BRIEF ONCOLOGIC HISTORY:    Malignant neoplasm of lower-outer quadrant of right breast of female, estrogen receptor positive ([place])   06/08/2016 Initial Diagnosis    Right breast asymmetry posteriorly 7 mm size, no ultrasound correlate, AFFIRM biopsy: Grade 2 invasive ductal carcinoma ER 100%, PR 95%, Ki-67 10%, HER-2 negative ratio 1.58, T1 BN 0 stage IA clinical stage      06/15/2016 Genetic Testing    Genetic testing did not reveal a deleterious mutation.  Variants of uncertain significance (VUSs) called DICER1 c.1468C>T (p.Arg490Cys) and MSH3 c.2041C>T (p.Pro681Ser) were also noted.  Genes tested include: APC, ATM, AXIN2, BARD1, BMPR1A, BRCA1, BRCA2, BRIP1, CDH1, CDKN2A, CHEK2, CTNNA1, DICER1, EPCAM, GREM1, HOXB13, KIT, MEN1, MLH1, MSH2, MSH3, MSH6, MUTYH, NBN, NF1, NTHL1, PALB2, PDGFRA, PMS2, POLD1, POLE, PTEN, RAD50, RAD51C, RAD51D, SDHA, SDHB, SDHC, SDHD, SMAD4, SMARCA4, STK11, TP53, TSC1, TSC2, and VHL.      07/06/2016 Surgery    Right lumpectomy: No tumor noted on the resected specimen      09/14/2016 Surgery    Right lumpectomy: Small residual focus of IDC grade 1, 0.2 cm, margins negative, ER 100%, PR 95%, HER-2 negative, Ki-67 10%, T1a N0 stage IA      10/27/2016 - 11/15/2016 Radiation Therapy    Adjuvant radiation with Dr. Sherryll Burger breast: 42.4 Gy in 16 treatments, Right breast boost: 10 Gy in 4 treatments      11/2016 -  Anti-estrogen oral therapy    Anastrozole daily       INTERVAL HISTORY:  Tabitha [name] presents to the [facility] today for our initial meeting to review her survivorship care plan detailing her treatment course for breast cancer, as well as monitoring long-term side effects of that treatment, education regarding health maintenance, screening, and overall wellness and health promotion.     Overall, Tabitha [name] reports feeling  quite well.  She has had two surgeries, however has had recurrent seromas and has had them drained and the tenderness remains.  She had radiation and finished in October.  In November she developed knots on her neck and on her arms, they will begin as erythematous hard lesions, they are about the size of a pencil head.  It does not itch.  They last about one week and then they decrease.  Her best friend is here today and has pictures on her phone.   She is taking Anastrozole daily and tolerates it well.  She denies vaginal dryness, hot flashes, joint aches.  She is able to afford the anastrozole    REVIEW OF SYSTEMS:  Review of Systems  Constitutional: Negative for appetite change, chills, fatigue, fever and unexpected weight change.  HENT:   Negative for hearing loss, lump/mass, mouth sores and trouble swallowing.   Eyes: Negative for eye problems and icterus.  Respiratory: Negative for chest tightness.   Cardiovascular: Negative for chest pain, leg swelling and palpitations.  Gastrointestinal: Negative for abdominal distention, abdominal pain, constipation, diarrhea and nausea.  Endocrine: Negative for hot flashes.  Genitourinary: Negative for difficulty urinating.   Musculoskeletal: Negative for arthralgias.  Skin: Positive for rash (as per interval history). Negative for itching.  Neurological: Negative for dizziness, extremity weakness, headaches and numbness.  Hematological: Negative for adenopathy. Does not bruise/bleed easily.  Breast: Denies any new nodularity, masses, tenderness, nipple changes, or nipple discharge.      ONCOLOGY TREATMENT TEAM:  1. Surgeon:  Dr. Excell Seltzer at [facility] 2. Medical Oncologist: Dr. Lindi Adie  3. Radiation Oncologist: Dr. Quitman Livings    PAST MEDICAL/SURGICAL HISTORY:  Past Medical History:  Diagnosis Date  . Anxiety   . Cancer ([place]) 06/2016   invasive ductal   . Chronic back pain   . Chronic pancreatitis ([place]) 01/27/2016  .  Diabetes ([place]) 01/27/2016  . Genetic testing 07/22/2016   Tabitha [name] underwent genetic counseling and testing for hereditary cancer syndromes on 06/15/2016. Her results were negative for mutations in all 46 genes analyzed by [employer]'s 46-gene Common Hereditary Cancers Panel. Genes analyzed include: APC, ATM, AXIN2, BARD1, BMPR1A, BRCA1, BRCA2, BRIP1, CDH1, CDKN2A, CHEK2, CTNNA1, DICER1, EPCAM, GREM1, HOXB13, KIT, MEN1, MLH1, MSH2, MSH3, MSH6, MUTYH, NBN,  . GERD (gastroesophageal reflux disease)   . Glaucoma 01/27/2016  . Headache   . High cholesterol 01/27/2016  . History of kidney stones   . Hypertension   . Wears dentures   . Wears glasses    Past Surgical History:  Procedure Laterality Date  . APPENDECTOMY    . BACK SURGERY     x 2  . BREAST BIOPSY Right 06/08/2016   invasive ductal   . BREAST EXCISIONAL BIOPSY    . BREAST LUMPECTOMY Right 06/2016  . BREAST LUMPECTOMY WITH RADIOACTIVE SEED AND SENTINEL LYMPH NODE BIOPSY Right 07/06/2016   Procedure: RIGHT BREAST LUMPECTOMY WITH RADIOACTIVE SEED AND AXILLARY SENTINEL LYMPH NODE BIOPSY;  Surgeon: Excell Seltzer, MD;  Location: [place];  Service: General;  Laterality: Right;  . BREAST LUMPECTOMY WITH RADIOACTIVE SEED LOCALIZATION Right 09/14/2016   Procedure: BREAST LUMPECTOMY WITH RADIOACTIVE SEED LOCALIZATION;  Surgeon: Excell Seltzer, MD;  Location: [place];  Service: General;  Laterality: Right;  . BREAST REDUCTION SURGERY    . CHOLECYSTECTOMY    . COLONOSCOPY W/ BIOPSIES AND POLYPECTOMY    . ESOPHAGOGASTRODUODENOSCOPY N/A 03/23/2016   Procedure: ESOPHAGOGASTRODUODENOSCOPY (EGD);  Surgeon: Rogene Houston, MD;  [facility];  Service: Endoscopy;  Laterality: N/A;  3:00  . MULTIPLE TOOTH EXTRACTIONS    . PARTIAL HYSTERECTOMY    . REDUCTION MAMMAPLASTY Bilateral   . rt foot spur    . rt shoulder rototar cuff    . TUBAL LIGATION       ALLERGIES:  Allergies  Allergen Reactions  . Ampicillin  Hives and Swelling    SWELLING REACTION UNSPECIFIED   . Barium-Containing Compounds Hives and Swelling    SWELLING REACTION UNSPECIFIED   . Penicillins Anaphylaxis, Hives and Swelling    PATIENT HAS HAD A PCN REACTION WITH IMMEDIATE RASH, FACIAL/TONGUE/THROAT SWELLING, SOB, OR LIGHTHEADEDNESS WITH HYPOTENSION:  #  #  #  YES  #  #  #  HAS PT DEVELOPED SEVERE RASH INVOLVING MUCUS MEMBRANES or SKIN NECROSIS: #  #  #  YES  #  #  #  Has patient had a PCN reaction that required hospitalization: No Has patient had a PCN reaction occurring within the last 10 years: No   . Morphine And Related Hives    Hives, rash , bad itch     CURRENT MEDICATIONS:  Outpatient Encounter Medications as of 03/07/2017  Medication Sig Note  . acetaminophen (TYLENOL) 325 MG tablet Take 325 mg by mouth 3 (three) times daily. 08/31/2016: Percocet too expensive so pt takes the 2 ingredients separately   . alendronate (FOSAMAX) 70 MG tablet Take 70 mg by mouth every Monday. Take with a full glass of water on an empty stomach.    Marland Kitchen  ALPRAZolam (XANAX) 0.5 MG tablet Take 0.5 mg by mouth at bedtime as needed for anxiety or sleep.    Marland Kitchen amLODipine (NORVASC) 5 MG tablet Take 5 mg by mouth daily.   Marland Kitchen anastrozole (ARIMIDEX) 1 MG tablet TAKE ONE TABLET BY MOUTH DAILY   . atorvastatin (LIPITOR) 20 MG tablet Take 20 mg by mouth every evening.    Marland Kitchen lisinopril (PRINIVIL,ZESTRIL) 40 MG tablet Take 40 mg by mouth daily.   . metFORMIN (GLUCOPHAGE) 500 MG tablet Take 500 mg by mouth 2 (two) times daily.    . Oxycodone HCl 10 MG TABS Take 10 mg by mouth 3 (three) times daily.    . Pancrelipase, Lip-Prot-Amyl, (CREON) 24000-76000 units CPEP Take 2 capsules by mouth 3 (three) times daily before meals.    . triamterene-hydrochlorothiazide (MAXZIDE) 75-50 MG tablet Take 1 tablet by mouth daily.   . [DISCONTINUED] dicyclomine (BENTYL) 10 MG capsule Take 1 capsule (10 mg total) by mouth 3 (three) times daily as needed for spasms. (Patient not  taking: Reported on 03/07/2017)    No facility-administered encounter medications on file as of 03/07/2017.      ONCOLOGIC FAMILY HISTORY:  Family History  Problem Relation Age of Onset  . Prostate cancer Father        d.69  . Diabetes Sister   . Breast cancer Sister 26       Maternal half-sister  . Esophageal varices Brother   . Diabetes Sister   . Breast cancer Paternal Aunt   . Brain cancer Paternal Uncle   . Breast cancer Maternal Grandmother        d.60s  . Breast cancer Paternal Aunt   . Skin cancer Paternal Uncle      GENETIC COUNSELING/TESTING: Yes, negative.  Two variants noted.  SOCIAL HISTORY:  Tabitha [name] is married and lives with her husband and daughter in [place], [place].  She has another daughter who lives with her family in [place], [place].  Tabitha [name] is currently on disability due to back surgery that prevented her return to work.  She denies any current or history of alcohol, or illicit drug use.  Current every day smoker of 1/2 ppd x 20 years all together.  10 pack year history.  Current vaporized user.     PHYSICAL EXAMINATION:  Vital Signs:   Vitals:   03/07/17 1503  BP: (!) 143/80  Pulse: 78  Resp: 18  Temp: 97.9 F (36.6 C)  SpO2: 100%   Filed Weights   03/07/17 1503  Weight: 183 lb (83 kg)   General: Well-nourished, well-appearing female in no acute distress.  She is accompanied in clinic by her best friend today.   HEENT: Head is normocephalic.  Pupils equal and reactive to light. Conjunctivae clear without exudate.  Sclerae anicteric. Oral mucosa is pink, moist.  Oropharynx is pink without lesions or erythema.  Lymph: No cervical, supraclavicular, or infraclavicular lymphadenopathy noted on palpation.  Cardiovascular: Regular rate and rhythm.Marland Kitchen Respiratory: Clear to auscultation bilaterally. Chest expansion symmetric; breathing non-labored.  Breast: right breast seroma noted, lumpectomy site well healed, no nodularity.  Left breast  without nodules, masses, skin or nipple changes. GI: Abdomen soft and round; non-tender, non-distended. Bowel sounds normoactive.  GU: Deferred.  Neuro: No focal deficits. Steady gait.  Psych: Mood and affect normal and appropriate for situation.  Extremities: No edema. MSK: No focal spinal tenderness to palpation.  Full range of motion in bilateral upper extremities Skin: Warm and dry.  LABORATORY  DATA:  None for this visit.  DIAGNOSTIC IMAGING:  None for this visit.      ASSESSMENT AND PLAN:  Ms.. [name] is a pleasant 62 y.o. female with Stage IA right breast invasive ductal carcinoma, ER+/PR+/HER2-, diagnosed in 06/2016, treated with lumpectomy, adjuvant radiation therapy, and anti-estrogen therapy with Anastrozole beginning in 11/2016.  She presents to the Survivorship Clinic for our initial meeting and routine follow-up post-completion of treatment for breast cancer.    1. Stage IA right breast cancer:  Tabitha [name] is continuing to recover from definitive treatment for breast cancer. She will follow-up with her medical oncologist, Dr.  with history and physical exam per surveillance protocol.  She will continue her anti-estrogen therapy with Anastrozole. Thus far, she is tolerating the Anastrozole well, with minimal side effects. Today, a comprehensive survivorship care plan and treatment summary was reviewed with the patient today detailing her breast cancer diagnosis, treatment course, potential late/long-term effects of treatment, appropriate follow-up care with recommendations for the future, and patient education resources.  A copy of this summary, along with a letter will be sent to the patient's primary care provider via mail/fax/In Basket message after today's visit.    2. Skin Rash: Patient's friend felt like the PCP didn't investigate the rash.  I recommended that if it is still continuing, that she see dermatology for evaluation.    3. Seroma: It has recurred and the patient is  distressed by this.  She has f/u with Dr. Arletha Grippe about it at the end of February.  I recommended that if it is still particularly bothersome, to call him and see if he can see her sooner.    4. Bone health:  Given Tabitha [name]'s age/history of breast cancer and her current treatment regimen including anti-estrogen therapy with Anastrozole, she is at risk for bone demineralization.  She thinks her PCP did a bone density test, but she cannot recall, she is going to look into this.  She was given education on specific activities to promote bone health.  5. Cancer screening:  Due to Tabitha [name] history and her age, she should receive screening for skin cancers, colon cancer, and gynecologic cancers.  The information and recommendations are listed on the patient's comprehensive care plan/treatment summary and were reviewed in detail with the patient.    6. Health maintenance and wellness promotion: Tabitha [name] was encouraged to consume 5-7 servings of fruits and vegetables per day. We reviewed the "Nutrition Rainbow" handout, as well as the handout "Take Control of Your Health and Reduce Your Cancer Risk" from the [facility].  She was also encouraged to engage in moderate to vigorous exercise for 30 minutes per day most days of the week. We discussed the [employer] fitness program, which is designed for cancer survivors to help them become more physically fit after cancer treatments.  She was instructed to limit her alcohol consumption and to stop smoking.     7. Support services/counseling: It is not uncommon for this period of the patient's cancer care trajectory to be one of many emotions and stressors.  We discussed an opportunity for her to participate in the next session of [facility] ("Finding Your New Normal") support group series designed for patients after they have completed treatment.   Tabitha [name] was encouraged to take advantage of our many other support services programs, support groups,  and/or counseling in coping with her new life as a cancer survivor after completing anti-cancer treatment.  She was offered support  today through active listening and expressive supportive counseling.  She was given information regarding our available services and encouraged to contact me with any questions or for help enrolling in any of our support group/programs.    Dispo:   -Return to cancer center in 6 months for f/u with Dr. Lindi Adie -Mammogram due in 06/2017 -Bone density (followed by PCP, requesting results) -Follow up with Dr. Excell Seltzer at [facility] 03/2017 -She is welcome to return back to the Survivorship Clinic at any time; no additional follow-up needed at this time.  -Consider referral back to survivorship as a long-term survivor for continued surveillance  A total of (50) minutes of face-to-face time was spent with this patient with greater than 50% of that time in counseling and care-coordination.   Gardenia Phlegm, NP Survivorship Program [facility] [phone]   Note: PRIMARY CARE PROVIDER Neale Burly, MD [phone]

## 2017-03-07 NOTE — Telephone Encounter (Signed)
Gave patient AVs and calendar of upcoming July appointments.  °

## 2017-03-21 DIAGNOSIS — M179 Osteoarthritis of knee, unspecified: Secondary | ICD-10-CM | POA: Diagnosis not present

## 2017-03-21 DIAGNOSIS — Z6830 Body mass index (BMI) 30.0-30.9, adult: Secondary | ICD-10-CM | POA: Diagnosis not present

## 2017-03-21 DIAGNOSIS — I10 Essential (primary) hypertension: Secondary | ICD-10-CM | POA: Diagnosis not present

## 2017-03-21 DIAGNOSIS — R1084 Generalized abdominal pain: Secondary | ICD-10-CM | POA: Diagnosis not present

## 2017-03-21 DIAGNOSIS — Z6831 Body mass index (BMI) 31.0-31.9, adult: Secondary | ICD-10-CM | POA: Diagnosis not present

## 2017-03-21 DIAGNOSIS — E119 Type 2 diabetes mellitus without complications: Secondary | ICD-10-CM | POA: Diagnosis not present

## 2017-03-21 DIAGNOSIS — K861 Other chronic pancreatitis: Secondary | ICD-10-CM | POA: Diagnosis not present

## 2017-04-06 ENCOUNTER — Ambulatory Visit: Payer: PPO | Admitting: Hematology and Oncology

## 2017-04-19 DIAGNOSIS — I10 Essential (primary) hypertension: Secondary | ICD-10-CM | POA: Diagnosis not present

## 2017-04-19 DIAGNOSIS — E119 Type 2 diabetes mellitus without complications: Secondary | ICD-10-CM | POA: Diagnosis not present

## 2017-04-26 DIAGNOSIS — C50511 Malignant neoplasm of lower-outer quadrant of right female breast: Secondary | ICD-10-CM | POA: Diagnosis not present

## 2017-04-26 DIAGNOSIS — Z170 Estrogen receptor positive status [ER+]: Secondary | ICD-10-CM | POA: Diagnosis not present

## 2017-04-27 DIAGNOSIS — N61 Mastitis without abscess: Secondary | ICD-10-CM | POA: Diagnosis not present

## 2017-04-27 DIAGNOSIS — C50911 Malignant neoplasm of unspecified site of right female breast: Secondary | ICD-10-CM | POA: Diagnosis not present

## 2017-04-27 DIAGNOSIS — Z170 Estrogen receptor positive status [ER+]: Secondary | ICD-10-CM | POA: Diagnosis not present

## 2017-05-02 ENCOUNTER — Other Ambulatory Visit: Payer: Self-pay | Admitting: General Surgery

## 2017-05-02 DIAGNOSIS — C50911 Malignant neoplasm of unspecified site of right female breast: Secondary | ICD-10-CM

## 2017-05-02 DIAGNOSIS — Z170 Estrogen receptor positive status [ER+]: Principal | ICD-10-CM

## 2017-05-11 DIAGNOSIS — N611 Abscess of the breast and nipple: Secondary | ICD-10-CM | POA: Diagnosis not present

## 2017-05-15 DIAGNOSIS — N611 Abscess of the breast and nipple: Secondary | ICD-10-CM | POA: Diagnosis not present

## 2017-05-16 DIAGNOSIS — I10 Essential (primary) hypertension: Secondary | ICD-10-CM | POA: Diagnosis not present

## 2017-05-16 DIAGNOSIS — M179 Osteoarthritis of knee, unspecified: Secondary | ICD-10-CM | POA: Diagnosis not present

## 2017-05-16 DIAGNOSIS — Z6830 Body mass index (BMI) 30.0-30.9, adult: Secondary | ICD-10-CM | POA: Diagnosis not present

## 2017-05-16 DIAGNOSIS — K861 Other chronic pancreatitis: Secondary | ICD-10-CM | POA: Diagnosis not present

## 2017-05-16 DIAGNOSIS — E119 Type 2 diabetes mellitus without complications: Secondary | ICD-10-CM | POA: Diagnosis not present

## 2017-05-19 DIAGNOSIS — M179 Osteoarthritis of knee, unspecified: Secondary | ICD-10-CM | POA: Diagnosis not present

## 2017-05-19 DIAGNOSIS — I10 Essential (primary) hypertension: Secondary | ICD-10-CM | POA: Diagnosis not present

## 2017-05-19 DIAGNOSIS — E119 Type 2 diabetes mellitus without complications: Secondary | ICD-10-CM | POA: Diagnosis not present

## 2017-05-30 ENCOUNTER — Other Ambulatory Visit: Payer: PPO

## 2017-06-01 ENCOUNTER — Ambulatory Visit: Payer: Self-pay | Admitting: General Surgery

## 2017-06-01 ENCOUNTER — Inpatient Hospital Stay (HOSPITAL_COMMUNITY): Admission: RE | Admit: 2017-06-01 | Payer: PPO | Source: Ambulatory Visit

## 2017-06-01 ENCOUNTER — Encounter: Payer: Self-pay | Admitting: Student

## 2017-06-01 NOTE — H&P (Signed)
Tabitha [name] Documented: 06/01/2017 11:03 AM Location: [facility] Patient #: [id number] DOB: 1955-10-21 Married / Language: English / Race: White Female   History of Present Illness The patient is a 62 year old female who presents with breast cancer. Patient returns for follow-up status post reexcision right breast lumpectomy. Her initial lumpectomy was complicated due to post biopsy hematoma distorting the imaging and pathology showed only normal breast tissue. We allowed the hematoma to resolve and re-localized her with the seed and she underwent lumpectomy on August 8. Pathology revealed a small focus of residual invasive carcinoma measuring only 2 mm. Margins were negative. Previous sentinel lymph node was negative.  Her original presentation was as follows:  She is a post menopausal female referred by Dr. Malka So for evaluation of recently diagnosed carcinoma of the right breast. She recently presented for a screening mamogram revealing possible asymmetry in the right breast. Subsequent imaging included diagnostic mamogram showing a 7 mm irregular mass in the inferior right breast seen on the MLO view only. Ultrasound was performed showing no ultrasound correlate and no apparent adenopathy. A stereotactic guided biopsywas performed on 06/08/2016 with pathology revealing invasive ductal carcinoma of the breast. She is seen now in breast multidisciplinary clinic for initial treatment planning. She has experienced no breast symptoms, specifically lump, skin changes or nipple discharge. Findings at that time were the following:  Tumor size: 0.7 cm Tumor grade: 2, Ki-67 10% Estrogen Receptor: +100% Progesterone Receptor: +95% Her-2 neu: negative Lymph node status: negative  She was seen in the early postoperative period for some redness and swelling around the incision and was started on doxycycline and then switched to days later to Bactrim. She completed that  with resolution of the symptoms. She completed radiation therapy in August 2018. She remains on Arimidex without significant side effects.  She has had a continued area of swelling and discomfort at the lumpectomy site. We have aspirated some cloudy fluid from the lumpectomy site on the last couple of visits. Patient also reports she developed an area of swelling under her left arm and was put on some clindamycin by her primary care that she completed.  About 3 weeks ago, she presented to the office with spontaneous drainage from her circumareolar incision. This was more widely opened by our PA and packed. Cultures from previous aspiration were negative. She was placed on Bactrim, and then switched to Doxycycline which she completed. This area has been packed several times in the office and at home by a nurse friend.   She returns for another follow-up today and states that the area has become more painful and red. It spontaneously drained a large amount of foul-smelling fluid yesterday morning. Her nurse friend packed it last night. She denies f/c/n/v.    Allergies  Morphine Derivatives  Penicillins  Ampicillin *PENICILLINS*  Barium-containing Compounds  Allergies Reconciled    Medication History  Anastrozole (1MG Tablet, Oral) Active. Creon (24000-76000UNIT Capsule DR Part, Oral) Active. AmLODIPine Besylate (5MG Tablet, Oral) Active. ALPRAZolam (0.5MG Tablet, Oral) Active. Alendronate Sodium (70MG Tablet, Oral) Active. Atorvastatin Calcium (20MG Tablet, Oral) Active. Latanoprost (0.005% Solution, Ophthalmic) Active. Lisinopril (40MG Tablet, Oral) Active. MetFORMIN HCl (500MG Tablet, Oral) Active. Triamterene-HCTZ (75-50MG Tablet, Oral) Active. Omeprazole (20MG Capsule DR, Oral) Active. Medications Reconciled   Review of Systems General Not Present- Appetite Loss, Chills, Fatigue, Fever, Night Sweats, Weight Gain and Weight Loss. Skin Not Present- Change  in Wart/Mole, Dryness, Hives, Jaundice, New Lesions, Non-Healing Wounds, Rash and Ulcer.  HEENT Present- Seasonal Allergies and Wears glasses/contact lenses. Not Present- Earache, Hearing Loss, Hoarseness, Nose Bleed, Oral Ulcers, Ringing in the Ears, Sinus Pain, Sore Throat, Visual Disturbances and Yellow Eyes. Respiratory Present- Snoring. Not Present- Bloody sputum, Chronic Cough, Difficulty Breathing and Wheezing. Breast Present- Breast Mass. Not Present- Breast Pain, Nipple Discharge and Skin Changes. Cardiovascular Present- Swelling of Extremities. Not Present- Chest Pain, Difficulty Breathing Lying Down, Leg Cramps, Palpitations, Rapid Heart Rate and Shortness of Breath. Gastrointestinal Not Present- Abdominal Pain, Bloating, Bloody Stool, Change in Bowel Habits, Chronic diarrhea, Constipation, Difficulty Swallowing, Excessive gas, Gets full quickly at meals, Hemorrhoids, Indigestion, Nausea, Rectal Pain and Vomiting. Female Genitourinary Present- Frequency. Not Present- Nocturia, Painful Urination, Pelvic Pain and Urgency. Musculoskeletal Present- Back Pain. Not Present- Joint Pain, Joint Stiffness, Muscle Pain, Muscle Weakness and Swelling of Extremities. Neurological Not Present- Decreased Memory, Fainting, Headaches, Numbness, Seizures, Tingling, Tremor, Trouble walking and Weakness. Psychiatric Present- Anxiety. Not Present- Bipolar, Change in Sleep Pattern, Depression, Fearful and Frequent crying. Endocrine Not Present- Cold Intolerance, Excessive Hunger, Hair Changes, Heat Intolerance, Hot flashes and New Diabetes. Hematology Not Present- Blood Thinners, Easy Bruising, Excessive bleeding, Gland problems, HIV and Persistent Infections.   Vitals  06/01/2017 11:03 AM Weight: 174.25 lb Height: 64in Body Surface Area: 1.84 m Body Mass Index: 29.91 kg/m  Temp.: 98.72F(Oral)  Pulse: 90 (Regular)  BP: 140/82 (Sitting, Left Arm, Standard)   Physical Exam   GENERAL: Well-developed, well nourished female  EYES: No scleral icterus Pupils equal, lids normal  EXTERNAL EARS: Intact, no masses or lesions EXTERNAL NOSE: Intact, no masses or lesions MOUTH: Lips - no lesions Dentition - normal for age  RESPIRATORY: Normal effort, no use of accessory muscles  MUSCULOSKELETAL: Normal gait Grossly normal ROM upper extremities Grossly normal ROM lower extremities  SKIN: Warm and dry Not diaphoretic  PSYCHIATRIC: Normal judgement and insight Normal mood and affect Alert, oriented x 3  Right breast: 1 cm opening on areolar border inferiorly. I removed the packing with some slight purulent drainage. There is worsening induration and erythema spreading to the lateral breast. Exquisite tenderness to palpation. Repacked with 1/4" iodoform gauze.    Assessment & Plan MALIGNANT NEOPLASM OF RIGHT BREAST, STAGE 1, ESTROGEN RECEPTOR POSITIVE (C50.911) Impression: 62 year old female with a diagnosis of cancer of the right breast, lower after quadrant. Clinical stage 1 a, ER positive, PR positive, HER-2 negative.  Underwent initial lumpectomy and negative sentinel lymph node biopsy with no tumor in the specimen with very difficult preoperative localization due to hematoma. Subsequent repeat lumpectomy patient of residual 2 millimeter focus of invasive disease with negative margins. Had initially some apparent mild cellulitis that responded to Bactrim.  Completed radiation therapy. Continues on Arimidex. Some swelling at the lumpectomy site consistent with seroma. This was aspirated again for the third time today obtaining 20 cc of cloudy serous fluid. This was sent for culture.  She has a soft mass or area of thickening posterior left axilla of questionable significance. Possibly some ectopic breast tissue. Mammogram is due in April and we will ask for ultrasound of the right breast to reassess the seroma site and of the left axilla as well.  These are now scheduled for May.   BREAST ABSCESS (N61.1) Impression: Over the past 3 weeks, she has developed a breast abscess that has been packed and was improving until a few days ago. She now has worsening induration and redness to the breast. Dr. Excell Seltzer evaluated the patient with me today and  recommends incision and drainage of right breast abscess in the operating room on Saturday, 06/03/17. I packed the wound today and she will keep the packing in place until Saturday. I will start her on Bactrim again.  She is scheduled for ultrasound and mammogram in May as of now.  Restarted Bactrim DS 800-160 MG Oral Tablet, 1 (one) Tablet two times daily, #14, 7 days starting 06/01/2017, No Refill.   Signed electronically by Kellie Shropshire, PA C (06/01/2017 12:23 PM)

## 2017-06-01 NOTE — Progress Notes (Signed)
EKG 07-04-16 EPIC

## 2017-06-01 NOTE — Progress Notes (Signed)
LOV DR HASANAJ 05-16-17 ON CHART

## 2017-06-01 NOTE — Patient Instructions (Addendum)
KAREL MOWERS  06/01/2017   Your procedure is scheduled on: 06-03-17  Report to [facility] AT [place] AM AND ASK EMERGENCY ROOM STAFF TO CALL [phone] PACU TO TELL PACU YOU HAVE ARRIVED FOR SURGERY.     Call this number if you have problems the morning of surgery [phone]  Remember: Do not eat food or drink liquids :After Midnight.  How to Manage Your Diabetes Before and After Surgery  Why is it important to control my blood sugar before and after surgery? . Improving blood sugar levels before and after surgery helps healing and can limit problems. . A way of improving blood sugar control is eating a healthy diet by: o  Eating less sugar and carbohydrates o  Increasing activity/exercise o  Talking with your doctor about reaching your blood sugar goals . High blood sugars (greater than 180 mg/dL) can raise your risk of infections and slow your recovery, so you will need to focus on controlling your diabetes during the weeks before surgery. . Make sure that the doctor who takes care of your diabetes knows about your planned surgery including the date and location.  How do I manage my blood sugar before surgery? . Check your blood sugar at least 4 times a day, starting 2 days before surgery, to make sure that the level is not too high or low. o Check your blood sugar the morning of your surgery when you wake up and every 2 hours until you get to the Short Stay unit. . If your blood sugar is less than 70 mg/dL, you will need to treat for low blood sugar: o Do not take insulin. o Treat a low blood sugar (less than 70 mg/dL) with  cup of clear juice (cranberry or apple), 4 glucose tablets, OR glucose gel. o Recheck blood sugar in 15 minutes after treatment (to make sure it is greater than 70 mg/dL). If your blood sugar is not greater than 70 mg/dL on recheck, call [phone] for further instructions. . Report your blood sugar to the short  stay nurse when you get to Short Stay.  . If you are admitted to the hospital after surgery: o Your blood sugar will be checked by the staff and you will probably be given insulin after surgery (instead of oral diabetes medicines) to make sure you have good blood sugar levels. o The goal for blood sugar control after surgery is 80-180 mg/dL.   WHAT DO I DO ABOUT MY DIABETES MEDICATION?  Marland Kitchen Do not take oral diabetes medicines (pills) the morning of surgery.  . THE DAY BEFORE SURGERY 06-02-17 TAKE YOUR METFORMIN AS USUAL. .       . THE MORNING OF SURGERY, DO NO TAKE YOUR METFORMIN..       Take these medicines the morning of surgery with A SIP OF WATER: OXYCODONE HCL,  AMLODIPINE (NORVASC), BACTRIM DO NOT TAKE ANY DIABETIC MEDICATIONS DAY OF YOUR SURGERY                               You may not have any metal on your body including hair pins and              piercings  Do not wear jewelry, make-up, lotions, powders or perfumes, deodorant  Do not wear nail polish.  Do not shave  48 hours prior to surgery.                Do not bring valuables to the hospital. [place].  Contacts, dentures or bridgework may not be worn into surgery.  Leave suitcase in the car. After surgery it may be brought to your room.                  Please read over the following fact sheets you were given: _____________________________________________________________________   [facility] - Preparing for Surgery Before surgery, you can play an important role.  Because skin is not sterile, your skin needs to be as free of germs as possible.  You can reduce the number of germs on your skin by washing with CHG (chlorahexidine gluconate) soap before surgery.  CHG is an antiseptic cleaner which kills germs and bonds with the skin to continue killing germs even after washing. Please DO NOT use if you have an allergy to CHG or antibacterial soaps.  If your  skin becomes reddened/irritated stop using the CHG and inform your nurse when you arrive at Short Stay. Do not shave (including legs and underarms) for at least 48 hours prior to the first CHG shower.  You may shave your face/neck. Please follow these instructions carefully:  1.  Shower with CHG Soap the night before surgery and the  morning of Surgery.  2.  If you choose to wash your hair, wash your hair first as usual with your  normal  shampoo.  3.  After you shampoo, rinse your hair and body thoroughly to remove the  shampoo.                           4.  Use CHG as you would any other liquid soap.  You can apply chg directly  to the skin and wash                       Gently with a scrungie or clean washcloth.  5.  Apply the CHG Soap to your body ONLY FROM THE NECK DOWN.   Do not use on face/ open                           Wound or open sores. Avoid contact with eyes, ears mouth and genitals (private parts).                       Wash face,  Genitals (private parts) with your normal soap.             6.  Wash thoroughly, paying special attention to the area where your surgery  will be performed.  7.  Thoroughly rinse your body with warm water from the neck down.  8.  DO NOT shower/wash with your normal soap after using and rinsing off  the CHG Soap.                9.  Pat yourself dry with a clean towel.            10.  Wear clean pajamas.            11.  Place clean sheets on your bed  the night of your first shower and do not  sleep with pets. Day of Surgery : Do not apply any lotions/deodorants the morning of surgery.  Please wear clean clothes to the hospital/surgery center.  FAILURE TO FOLLOW THESE INSTRUCTIONS MAY RESULT IN THE CANCELLATION OF YOUR SURGERY PATIENT SIGNATURE_________________________________  NURSE SIGNATURE__________________________________  ________________________________________________________________________

## 2017-06-01 NOTE — H&P (View-Only) (Signed)
Tabitha [name] Documented: 06/01/2017 11:03 AM Location: [facility] Patient #: [id number] DOB: [birth year] Married / Language: English / Race: White Female   History of Present Illness The patient is a 62 year old female who presents with breast cancer. Patient returns for follow-up status post reexcision right breast lumpectomy. Her initial lumpectomy was complicated due to post biopsy hematoma distorting the imaging and pathology showed only normal breast tissue. We allowed the hematoma to resolve and re-localized her with the seed and she underwent lumpectomy on August 8. Pathology revealed a small focus of residual invasive carcinoma measuring only 2 mm. Margins were negative. Previous sentinel lymph node was negative.  Her original presentation was as follows:  She is a post menopausal female referred by Dr. Malka So for evaluation of recently diagnosed carcinoma of the right breast. She recently presented for a screening mamogram revealing possible asymmetry in the right breast. Subsequent imaging included diagnostic mamogram showing a 7 mm irregular mass in the inferior right breast seen on the MLO view only. Ultrasound was performed showing no ultrasound correlate and no apparent adenopathy. A stereotactic guided biopsywas performed on 06/08/2016 with pathology revealing invasive ductal carcinoma of the breast. She is seen now in breast multidisciplinary clinic for initial treatment planning. She has experienced no breast symptoms, specifically lump, skin changes or nipple discharge. Findings at that time were the following:  Tumor size: 0.7 cm Tumor grade: 2, Ki-67 10% Estrogen Receptor: +100% Progesterone Receptor: +95% Her-2 neu: negative Lymph node status: negative  She was seen in the early postoperative period for some redness and swelling around the incision and was started on doxycycline and then switched to days later to Bactrim. She completed that  with resolution of the symptoms. She completed radiation therapy in August 2018. She remains on Arimidex without significant side effects.  She has had a continued area of swelling and discomfort at the lumpectomy site. We have aspirated some cloudy fluid from the lumpectomy site on the last couple of visits. Patient also reports she developed an area of swelling under her left arm and was put on some clindamycin by her primary care that she completed.  About 3 weeks ago, she presented to the office with spontaneous drainage from her circumareolar incision. This was more widely opened by our PA and packed. Cultures from previous aspiration were negative. She was placed on Bactrim, and then switched to Doxycycline which she completed. This area has been packed several times in the office and at home by a nurse friend.   She returns for another follow-up today and states that the area has become more painful and red. It spontaneously drained a large amount of foul-smelling fluid yesterday morning. Her nurse friend packed it last night. She denies f/c/n/v.    Allergies  Morphine Derivatives  Penicillins  Ampicillin *PENICILLINS*  Barium-containing Compounds  Allergies Reconciled    Medication History  Anastrozole (1MG Tablet, Oral) Active. Creon (24000-76000UNIT Capsule DR Part, Oral) Active. AmLODIPine Besylate (5MG Tablet, Oral) Active. ALPRAZolam (0.5MG Tablet, Oral) Active. Alendronate Sodium (70MG Tablet, Oral) Active. Atorvastatin Calcium (20MG Tablet, Oral) Active. Latanoprost (0.005% Solution, Ophthalmic) Active. Lisinopril (40MG Tablet, Oral) Active. MetFORMIN HCl (500MG Tablet, Oral) Active. Triamterene-HCTZ (75-50MG Tablet, Oral) Active. Omeprazole (20MG Capsule DR, Oral) Active. Medications Reconciled   Review of Systems General Not Present- Appetite Loss, Chills, Fatigue, Fever, Night Sweats, Weight Gain and Weight Loss. Skin Not Present- Change  in Wart/Mole, Dryness, Hives, Jaundice, New Lesions, Non-Healing Wounds, Rash and Ulcer.  HEENT Present- Seasonal Allergies and Wears glasses/contact lenses. Not Present- Earache, Hearing Loss, Hoarseness, Nose Bleed, Oral Ulcers, Ringing in the Ears, Sinus Pain, Sore Throat, Visual Disturbances and Yellow Eyes. Respiratory Present- Snoring. Not Present- Bloody sputum, Chronic Cough, Difficulty Breathing and Wheezing. Breast Present- Breast Mass. Not Present- Breast Pain, Nipple Discharge and Skin Changes. Cardiovascular Present- Swelling of Extremities. Not Present- Chest Pain, Difficulty Breathing Lying Down, Leg Cramps, Palpitations, Rapid Heart Rate and Shortness of Breath. Gastrointestinal Not Present- Abdominal Pain, Bloating, Bloody Stool, Change in Bowel Habits, Chronic diarrhea, Constipation, Difficulty Swallowing, Excessive gas, Gets full quickly at meals, Hemorrhoids, Indigestion, Nausea, Rectal Pain and Vomiting. Female Genitourinary Present- Frequency. Not Present- Nocturia, Painful Urination, Pelvic Pain and Urgency. Musculoskeletal Present- Back Pain. Not Present- Joint Pain, Joint Stiffness, Muscle Pain, Muscle Weakness and Swelling of Extremities. Neurological Not Present- Decreased Memory, Fainting, Headaches, Numbness, Seizures, Tingling, Tremor, Trouble walking and Weakness. Psychiatric Present- Anxiety. Not Present- Bipolar, Change in Sleep Pattern, Depression, Fearful and Frequent crying. Endocrine Not Present- Cold Intolerance, Excessive Hunger, Hair Changes, Heat Intolerance, Hot flashes and New Diabetes. Hematology Not Present- Blood Thinners, Easy Bruising, Excessive bleeding, Gland problems, HIV and Persistent Infections.   Vitals  06/01/2017 11:03 AM Weight: 174.25 lb Height: 64in Body Surface Area: 1.84 m Body Mass Index: 29.91 kg/m  Temp.: 98.49F(Oral)  Pulse: 90 (Regular)  BP: 140/82 (Sitting, Left Arm, Standard)   Physical Exam   GENERAL: Well-developed, well nourished female  EYES: No scleral icterus Pupils equal, lids normal  EXTERNAL EARS: Intact, no masses or lesions EXTERNAL NOSE: Intact, no masses or lesions MOUTH: Lips - no lesions Dentition - normal for age  RESPIRATORY: Normal effort, no use of accessory muscles  MUSCULOSKELETAL: Normal gait Grossly normal ROM upper extremities Grossly normal ROM lower extremities  SKIN: Warm and dry Not diaphoretic  PSYCHIATRIC: Normal judgement and insight Normal mood and affect Alert, oriented x 3  Right breast: 1 cm opening on areolar border inferiorly. I removed the packing with some slight purulent drainage. There is worsening induration and erythema spreading to the lateral breast. Exquisite tenderness to palpation. Repacked with 1/4" iodoform gauze.    Assessment & Plan MALIGNANT NEOPLASM OF RIGHT BREAST, STAGE 1, ESTROGEN RECEPTOR POSITIVE (C50.911) Impression: 62 year old female with a diagnosis of cancer of the right breast, lower after quadrant. Clinical stage 1 a, ER positive, PR positive, HER-2 negative.  Underwent initial lumpectomy and negative sentinel lymph node biopsy with no tumor in the specimen with very difficult preoperative localization due to hematoma. Subsequent repeat lumpectomy patient of residual 2 millimeter focus of invasive disease with negative margins. Had initially some apparent mild cellulitis that responded to Bactrim.  Completed radiation therapy. Continues on Arimidex. Some swelling at the lumpectomy site consistent with seroma. This was aspirated again for the third time today obtaining 20 cc of cloudy serous fluid. This was sent for culture.  She has a soft mass or area of thickening posterior left axilla of questionable significance. Possibly some ectopic breast tissue. Mammogram is due in April and we will ask for ultrasound of the right breast to reassess the seroma site and of the left axilla as well.  These are now scheduled for May.   BREAST ABSCESS (N61.1) Impression: Over the past 3 weeks, she has developed a breast abscess that has been packed and was improving until a few days ago. She now has worsening induration and redness to the breast. Dr. Excell Seltzer evaluated the patient with me today and  recommends incision and drainage of right breast abscess in the operating room on Saturday, 06/03/17. I packed the wound today and she will keep the packing in place until Saturday. I will start her on Bactrim again.  She is scheduled for ultrasound and mammogram in May as of now.  Restarted Bactrim DS 800-160 MG Oral Tablet, 1 (one) Tablet two times daily, #14, 7 days starting 06/01/2017, No Refill.   Signed electronically by Kellie Shropshire, PA C (06/01/2017 12:23 PM)

## 2017-06-02 ENCOUNTER — Encounter (HOSPITAL_COMMUNITY)
Admission: RE | Admit: 2017-06-02 | Discharge: 2017-06-02 | Disposition: A | Discharge status: Home / Self Care | Payer: PPO | Source: Ambulatory Visit | Attending: General Surgery | Admitting: General Surgery

## 2017-06-02 ENCOUNTER — Other Ambulatory Visit: Payer: Self-pay

## 2017-06-02 ENCOUNTER — Encounter (HOSPITAL_COMMUNITY): Payer: Self-pay | Admitting: *Deleted

## 2017-06-02 ENCOUNTER — Encounter (HOSPITAL_COMMUNITY): Payer: Self-pay

## 2017-06-02 DIAGNOSIS — Z880 Allergy status to penicillin: Secondary | ICD-10-CM | POA: Diagnosis not present

## 2017-06-02 DIAGNOSIS — Z170 Estrogen receptor positive status [ER+]: Secondary | ICD-10-CM | POA: Diagnosis not present

## 2017-06-02 DIAGNOSIS — Z7984 Long term (current) use of oral hypoglycemic drugs: Secondary | ICD-10-CM | POA: Diagnosis not present

## 2017-06-02 DIAGNOSIS — T8141XD Infection following a procedure, superficial incisional surgical site, subsequent encounter: Secondary | ICD-10-CM | POA: Diagnosis not present

## 2017-06-02 DIAGNOSIS — Z885 Allergy status to narcotic agent status: Secondary | ICD-10-CM | POA: Diagnosis not present

## 2017-06-02 DIAGNOSIS — Z881 Allergy status to other antibiotic agents status: Secondary | ICD-10-CM | POA: Diagnosis not present

## 2017-06-02 DIAGNOSIS — Z853 Personal history of malignant neoplasm of breast: Secondary | ICD-10-CM | POA: Diagnosis not present

## 2017-06-02 DIAGNOSIS — X58XXXA Exposure to other specified factors, initial encounter: Secondary | ICD-10-CM | POA: Diagnosis not present

## 2017-06-02 DIAGNOSIS — E119 Type 2 diabetes mellitus without complications: Secondary | ICD-10-CM | POA: Diagnosis not present

## 2017-06-02 DIAGNOSIS — N611 Abscess of the breast and nipple: Secondary | ICD-10-CM | POA: Diagnosis not present

## 2017-06-02 DIAGNOSIS — Z79899 Other long term (current) drug therapy: Secondary | ICD-10-CM | POA: Diagnosis not present

## 2017-06-02 HISTORY — DX: Abscess of the breast and nipple: N61.1

## 2017-06-02 HISTORY — DX: Rash and other nonspecific skin eruption: R21

## 2017-06-02 LAB — CBC
HCT: 44.5 % (ref 36.0–46.0)
Hemoglobin: 14.9 g/dL (ref 12.0–15.0)
MCH: 29.5 pg (ref 26.0–34.0)
MCHC: 33.5 g/dL (ref 30.0–36.0)
MCV: 88.1 fL (ref 78.0–100.0)
PLATELETS: 236 10*3/uL (ref 150–400)
RBC: 5.05 MIL/uL (ref 3.87–5.11)
RDW: 12.7 % (ref 11.5–15.5)
WBC: 8.4 10*3/uL (ref 4.0–10.5)

## 2017-06-02 LAB — COMPREHENSIVE METABOLIC PANEL
ALT: 18 U/L (ref 14–54)
AST: 19 U/L (ref 15–41)
Albumin: 3.8 g/dL (ref 3.5–5.0)
Alkaline Phosphatase: 69 U/L (ref 38–126)
Anion gap: 11 (ref 5–15)
BILIRUBIN TOTAL: 0.7 mg/dL (ref 0.3–1.2)
BUN: 9 mg/dL (ref 6–20)
CO2: 24 mmol/L (ref 22–32)
CREATININE: 0.75 mg/dL (ref 0.44–1.00)
Calcium: 9.2 mg/dL (ref 8.9–10.3)
Chloride: 103 mmol/L (ref 101–111)
GFR calc Af Amer: >60 mL/min (ref >60)
Glucose, Bld: 130 mg/dL — ABNORMAL HIGH (ref 65–99)
Potassium: 4.2 mmol/L (ref 3.5–5.1)
Sodium: 138 mmol/L (ref 135–145)
TOTAL PROTEIN: 7.1 g/dL (ref 6.5–8.1)

## 2017-06-02 LAB — HEMOGLOBIN A1C
Hgb A1c MFr Bld: 6.2 % — ABNORMAL HIGH (ref 4.8–5.6)
Mean Plasma Glucose: 131.24 mg/dL

## 2017-06-02 LAB — GLUCOSE, CAPILLARY: GLUCOSE-CAPILLARY: 126 mg/dL — AB (ref 65–99)

## 2017-06-02 NOTE — Anesthesia Preprocedure Evaluation (Addendum)
Anesthesia Evaluation  Patient identified by MRN, date of birth, ID band Patient awake    Reviewed: Allergy & Precautions, NPO status , Patient's Chart, lab work & pertinent test results  Airway Mallampati: II  TM Distance: >3 FB Neck ROM: Full    Dental  (+) Dental Advisory Given   Pulmonary Current Smoker,    breath sounds clear to auscultation       Cardiovascular hypertension, Pt. on medications  Rhythm:Regular Rate:Normal     Neuro/Psych Anxiety Depression negative neurological ROS     GI/Hepatic Neg liver ROS, GERD  ,Chronic pancreatitis   Endo/Other  diabetes, Type 2, Oral Hypoglycemic Agents  Renal/GU negative Renal ROS     Musculoskeletal negative musculoskeletal ROS (+)   Abdominal   Peds  Hematology   Anesthesia Other Findings   Reproductive/Obstetrics                            Lab Results  Component Value Date   WBC 8.4 06/02/2017   HGB 14.9 06/02/2017   HCT 44.5 06/02/2017   MCV 88.1 06/02/2017   PLT 236 06/02/2017   Lab Results  Component Value Date   CREATININE 0.75 06/02/2017   BUN 9 06/02/2017   NA 138 06/02/2017   K 4.2 06/02/2017   CL 103 06/02/2017   CO2 24 06/02/2017    Anesthesia Physical Anesthesia Plan  ASA: II  Anesthesia Plan: General   Post-op Pain Management:    Induction: Intravenous  PONV Risk Score and Plan: 2 and Ondansetron, Dexamethasone and Treatment may vary due to age or medical condition  Airway Management Planned: LMA  Additional Equipment:   Intra-op Plan:   Post-operative Plan: Extubation in OR  Informed Consent: I have reviewed the patients History and Physical, chart, labs and discussed the procedure including the risks, benefits and alternatives for the proposed anesthesia with the patient or authorized representative who has indicated his/her understanding and acceptance.   Dental advisory given  Plan Discussed  with: CRNA  Anesthesia Plan Comments:        Anesthesia Quick Evaluation

## 2017-06-03 ENCOUNTER — Encounter (HOSPITAL_COMMUNITY)
Admission: RE | Disposition: A | Discharge status: Home / Self Care | Payer: Self-pay | Source: Ambulatory Visit | Attending: General Surgery

## 2017-06-03 ENCOUNTER — Encounter (HOSPITAL_COMMUNITY): Payer: Self-pay | Admitting: *Deleted

## 2017-06-03 ENCOUNTER — Ambulatory Visit (HOSPITAL_COMMUNITY): Payer: PPO | Admitting: Certified Registered Nurse Anesthetist

## 2017-06-03 ENCOUNTER — Observation Stay (HOSPITAL_COMMUNITY)
Admission: RE | Admit: 2017-06-03 | Discharge: 2017-06-04 | Disposition: A | Discharge status: Home / Self Care | Payer: PPO | Source: Ambulatory Visit | Attending: General Surgery | Admitting: General Surgery

## 2017-06-03 DIAGNOSIS — I10 Essential (primary) hypertension: Secondary | ICD-10-CM | POA: Diagnosis not present

## 2017-06-03 DIAGNOSIS — C50911 Malignant neoplasm of unspecified site of right female breast: Secondary | ICD-10-CM | POA: Diagnosis not present

## 2017-06-03 DIAGNOSIS — Z853 Personal history of malignant neoplasm of breast: Secondary | ICD-10-CM | POA: Insufficient documentation

## 2017-06-03 DIAGNOSIS — Z885 Allergy status to narcotic agent status: Secondary | ICD-10-CM | POA: Insufficient documentation

## 2017-06-03 DIAGNOSIS — X58XXXA Exposure to other specified factors, initial encounter: Secondary | ICD-10-CM | POA: Insufficient documentation

## 2017-06-03 DIAGNOSIS — Z880 Allergy status to penicillin: Secondary | ICD-10-CM | POA: Insufficient documentation

## 2017-06-03 DIAGNOSIS — Z7984 Long term (current) use of oral hypoglycemic drugs: Secondary | ICD-10-CM | POA: Insufficient documentation

## 2017-06-03 DIAGNOSIS — N611 Abscess of the breast and nipple: Secondary | ICD-10-CM | POA: Diagnosis present

## 2017-06-03 DIAGNOSIS — E7800 Pure hypercholesterolemia, unspecified: Secondary | ICD-10-CM | POA: Diagnosis not present

## 2017-06-03 DIAGNOSIS — Z79899 Other long term (current) drug therapy: Secondary | ICD-10-CM | POA: Diagnosis not present

## 2017-06-03 DIAGNOSIS — Z881 Allergy status to other antibiotic agents status: Secondary | ICD-10-CM | POA: Insufficient documentation

## 2017-06-03 DIAGNOSIS — Z170 Estrogen receptor positive status [ER+]: Secondary | ICD-10-CM | POA: Diagnosis not present

## 2017-06-03 DIAGNOSIS — T8141XD Infection following a procedure, superficial incisional surgical site, subsequent encounter: Secondary | ICD-10-CM | POA: Diagnosis not present

## 2017-06-03 DIAGNOSIS — E119 Type 2 diabetes mellitus without complications: Secondary | ICD-10-CM | POA: Insufficient documentation

## 2017-06-03 DIAGNOSIS — K861 Other chronic pancreatitis: Secondary | ICD-10-CM

## 2017-06-03 HISTORY — PX: INCISION AND DRAINAGE ABSCESS: SHX5864

## 2017-06-03 HISTORY — DX: Major depressive disorder, single episode, unspecified: F32.9

## 2017-06-03 HISTORY — DX: Depression, unspecified: F32.A

## 2017-06-03 LAB — GLUCOSE, CAPILLARY
GLUCOSE-CAPILLARY: 156 mg/dL — AB (ref 65–99)
GLUCOSE-CAPILLARY: 136 mg/dL — AB (ref 65–99)
GLUCOSE-CAPILLARY: 180 mg/dL — AB (ref 65–99)
Glucose-Capillary: 154 mg/dL — ABNORMAL HIGH (ref 65–99)
Glucose-Capillary: 164 mg/dL — ABNORMAL HIGH (ref 65–99)

## 2017-06-03 SURGERY — INCISION AND DRAINAGE, ABSCESS
Anesthesia: General | Laterality: Right

## 2017-06-03 MED ORDER — FENTANYL CITRATE (PF) 100 MCG/2ML IJ SOLN
INTRAMUSCULAR | Status: AC
  Filled 2017-06-03: qty 2

## 2017-06-03 MED ORDER — MIDAZOLAM HCL 5 MG/5ML IJ SOLN
INTRAMUSCULAR | Status: DC | PRN
  Administered 2017-06-03: 2 mg via INTRAVENOUS

## 2017-06-03 MED ORDER — FENTANYL CITRATE (PF) 100 MCG/2ML IJ SOLN
INTRAMUSCULAR | Status: DC | PRN
  Administered 2017-06-03 (×2): 50 ug via INTRAVENOUS

## 2017-06-03 MED ORDER — FENTANYL CITRATE (PF) 100 MCG/2ML IJ SOLN
25.0000 ug | INTRAMUSCULAR | Status: DC | PRN
  Administered 2017-06-03 (×3): 50 ug via INTRAVENOUS

## 2017-06-03 MED ORDER — VANCOMYCIN HCL IN DEXTROSE 1-5 GM/200ML-% IV SOLN
INTRAVENOUS | Status: AC
  Filled 2017-06-03: qty 200

## 2017-06-03 MED ORDER — VANCOMYCIN HCL 1000 MG IV SOLR
1000.0000 mg | Freq: Two times a day (BID) | INTRAVENOUS | Status: DC
  Administered 2017-06-03: 1000 mg via INTRAVENOUS
  Filled 2017-06-03 (×4): qty 1000

## 2017-06-03 MED ORDER — ENOXAPARIN SODIUM 40 MG/0.4ML ~~LOC~~ SOLN
40.0000 mg | SUBCUTANEOUS | Status: DC
  Administered 2017-06-04: 40 mg via SUBCUTANEOUS
  Filled 2017-06-03: qty 0.4

## 2017-06-03 MED ORDER — 0.9 % SODIUM CHLORIDE (POUR BTL) OPTIME
TOPICAL | Status: DC | PRN
  Administered 2017-06-03: 1000 mL

## 2017-06-03 MED ORDER — SODIUM CHLORIDE 0.9 % IV SOLN: INTRAVENOUS | Status: DC

## 2017-06-03 MED ORDER — ONDANSETRON HCL 4 MG/2ML IJ SOLN: 4.0000 mg | Freq: Once | INTRAMUSCULAR | Status: DC | PRN

## 2017-06-03 MED ORDER — ALPRAZOLAM 0.5 MG PO TABS
0.5000 mg | ORAL_TABLET | Freq: Every evening | ORAL | Status: DC | PRN
Start: 2017-06-03 — End: 2017-06-04
  Administered 2017-06-03: 0.5 mg via ORAL
  Filled 2017-06-03: qty 1

## 2017-06-03 MED ORDER — ONDANSETRON 4 MG PO TBDP: 4.0000 mg | ORAL_TABLET | Freq: Four times a day (QID) | ORAL | Status: DC | PRN

## 2017-06-03 MED ORDER — OXYCODONE HCL 5 MG PO TABS
5.0000 mg | ORAL_TABLET | ORAL | Status: DC | PRN
  Administered 2017-06-03 – 2017-06-04 (×5): 10 mg via ORAL
  Filled 2017-06-03 (×5): qty 2

## 2017-06-03 MED ORDER — BUPIVACAINE HCL (PF) 0.25 % IJ SOLN
INTRAMUSCULAR | Status: AC
  Filled 2017-06-03: qty 30

## 2017-06-03 MED ORDER — DEXAMETHASONE SODIUM PHOSPHATE 10 MG/ML IJ SOLN
INTRAMUSCULAR | Status: AC
  Filled 2017-06-03: qty 1

## 2017-06-03 MED ORDER — FENTANYL CITRATE (PF) 100 MCG/2ML IJ SOLN
12.5000 ug | INTRAMUSCULAR | Status: DC | PRN
  Administered 2017-06-03 – 2017-06-04 (×3): 12.5 ug via INTRAVENOUS
  Filled 2017-06-03 (×3): qty 2

## 2017-06-03 MED ORDER — PHENYLEPHRINE HCL-NACL 10-0.9 MG/250ML-% IV SOLN
INTRAVENOUS | Status: AC
  Filled 2017-06-03: qty 250

## 2017-06-03 MED ORDER — SULFAMETHOXAZOLE-TRIMETHOPRIM 800-160 MG PO TABS
1.0000 | ORAL_TABLET | Freq: Two times a day (BID) | ORAL | Status: DC
  Administered 2017-06-03 – 2017-06-04 (×3): 1 via ORAL
  Filled 2017-06-03 (×4): qty 1

## 2017-06-03 MED ORDER — AMLODIPINE BESYLATE 5 MG PO TABS
5.0000 mg | ORAL_TABLET | Freq: Every day | ORAL | Status: DC
  Administered 2017-06-03: 5 mg via ORAL
  Filled 2017-06-03 (×2): qty 1

## 2017-06-03 MED ORDER — LISINOPRIL 20 MG PO TABS
40.0000 mg | ORAL_TABLET | Freq: Every day | ORAL | Status: DC
  Administered 2017-06-03: 40 mg via ORAL
  Filled 2017-06-03 (×2): qty 2

## 2017-06-03 MED ORDER — PROPOFOL 10 MG/ML IV BOLUS
INTRAVENOUS | Status: DC | PRN
  Administered 2017-06-03: 150 mg via INTRAVENOUS

## 2017-06-03 MED ORDER — ONDANSETRON HCL 4 MG/2ML IJ SOLN
INTRAMUSCULAR | Status: DC | PRN
  Administered 2017-06-03: 4 mg via INTRAVENOUS

## 2017-06-03 MED ORDER — ACETAMINOPHEN 325 MG PO TABS: 650.0000 mg | ORAL_TABLET | Freq: Four times a day (QID) | ORAL | Status: DC | PRN

## 2017-06-03 MED ORDER — ACETAMINOPHEN 650 MG RE SUPP: 650.0000 mg | Freq: Four times a day (QID) | RECTAL | Status: DC | PRN

## 2017-06-03 MED ORDER — PROPOFOL 10 MG/ML IV BOLUS
INTRAVENOUS | Status: AC
  Filled 2017-06-03: qty 20

## 2017-06-03 MED ORDER — MIDAZOLAM HCL 2 MG/2ML IJ SOLN
INTRAMUSCULAR | Status: AC
  Filled 2017-06-03: qty 2

## 2017-06-03 MED ORDER — ATORVASTATIN CALCIUM 20 MG PO TABS
20.0000 mg | ORAL_TABLET | Freq: Every evening | ORAL | Status: DC
  Administered 2017-06-03: 20 mg via ORAL
  Filled 2017-06-03 (×2): qty 1

## 2017-06-03 MED ORDER — INSULIN ASPART 100 UNIT/ML ~~LOC~~ SOLN
0.0000 [IU] | Freq: Three times a day (TID) | SUBCUTANEOUS | Status: DC
  Administered 2017-06-03 (×2): 2 [IU] via SUBCUTANEOUS
  Administered 2017-06-04: 1 [IU] via SUBCUTANEOUS

## 2017-06-03 MED ORDER — DEXAMETHASONE SODIUM PHOSPHATE 10 MG/ML IJ SOLN
INTRAMUSCULAR | Status: DC | PRN
  Administered 2017-06-03: 10 mg via INTRAVENOUS

## 2017-06-03 MED ORDER — LIDOCAINE 2% (20 MG/ML) 5 ML SYRINGE
INTRAMUSCULAR | Status: DC | PRN
  Administered 2017-06-03: 100 mg via INTRAVENOUS

## 2017-06-03 MED ORDER — ACETAMINOPHEN 325 MG PO TABS: 650.0000 mg | ORAL_TABLET | ORAL | Status: DC | PRN

## 2017-06-03 MED ORDER — ONDANSETRON HCL 4 MG/2ML IJ SOLN
INTRAMUSCULAR | Status: AC
  Filled 2017-06-03: qty 2

## 2017-06-03 MED ORDER — ANASTROZOLE 1 MG PO TABS
1.0000 mg | ORAL_TABLET | Freq: Every day | ORAL | Status: DC
  Administered 2017-06-03: 1 mg via ORAL
  Filled 2017-06-03: qty 1

## 2017-06-03 MED ORDER — LACTATED RINGERS IV SOLN
INTRAVENOUS | Status: DC | PRN
  Administered 2017-06-03: 07:00:00 via INTRAVENOUS

## 2017-06-03 MED ORDER — ONDANSETRON HCL 4 MG/2ML IJ SOLN: 4.0000 mg | Freq: Four times a day (QID) | INTRAMUSCULAR | Status: DC | PRN

## 2017-06-03 MED ORDER — TRIAMTERENE-HCTZ 75-50 MG PO TABS
1.0000 | ORAL_TABLET | Freq: Every day | ORAL | Status: DC
  Administered 2017-06-03: 1 via ORAL
  Filled 2017-06-03 (×2): qty 1

## 2017-06-03 MED ORDER — BUPIVACAINE-EPINEPHRINE (PF) 0.5% -1:200000 IJ SOLN
INTRAMUSCULAR | Status: AC
  Filled 2017-06-03: qty 30

## 2017-06-03 SURGICAL SUPPLY — 24 items
BNDG GAUZE ELAST 4 BULKY (GAUZE/BANDAGES/DRESSINGS) IMPLANT
DECANTER SPIKE VIAL GLASS SM (MISCELLANEOUS) IMPLANT
DRAPE LAPAROSCOPIC ABDOMINAL (DRAPES) IMPLANT
DRSG PAD ABDOMINAL 8X10 ST (GAUZE/BANDAGES/DRESSINGS) IMPLANT
ELECT REM PT RETURN 15FT ADLT (MISCELLANEOUS) ×3 IMPLANT
GAUZE PACKING IODOFORM 1/4X15 (GAUZE/BANDAGES/DRESSINGS) ×2 IMPLANT
GAUZE PACKING IODOFORM 1X5 (MISCELLANEOUS) ×2 IMPLANT
GAUZE SPONGE 4X4 12PLY STRL (GAUZE/BANDAGES/DRESSINGS) ×2 IMPLANT
GLOVE BIO SURGEON STRL SZ7.5 (GLOVE) ×3 IMPLANT
GLOVE ECLIPSE 7.5 STRL STRAW (GLOVE) ×3 IMPLANT
GOWN STRL REUS W/TWL XL LVL3 (GOWN DISPOSABLE) ×6 IMPLANT
KIT BASIN OR (CUSTOM PROCEDURE TRAY) ×3 IMPLANT
NDL HYPO 25X1 1.5 SAFETY (NEEDLE) IMPLANT
NEEDLE HYPO 25X1 1.5 SAFETY (NEEDLE) IMPLANT
PACK GENERAL/GYN (CUSTOM PROCEDURE TRAY) ×3 IMPLANT
SPONGE LAP 18X18 RF (DISPOSABLE) IMPLANT
SUT MNCRL AB 4-0 PS2 18 (SUTURE) IMPLANT
SUT VIC AB 3-0 SH 27 (SUTURE)
SUT VIC AB 3-0 SH 27XBRD (SUTURE) IMPLANT
SWAB COLLECTION DEVICE MRSA (MISCELLANEOUS) IMPLANT
SWAB CULTURE ESWAB REG 1ML (MISCELLANEOUS) IMPLANT
SYR CONTROL 10ML LL (SYRINGE) IMPLANT
TOWEL OR 17X26 10 PK STRL BLUE (TOWEL DISPOSABLE) ×3 IMPLANT
TOWEL OR NON WOVEN STRL DISP B (DISPOSABLE) IMPLANT

## 2017-06-03 NOTE — Anesthesia Procedure Notes (Signed)
Procedure Name: LMA Insertion Date/Time: 06/03/2017 7:57 AM Performed by: Sharlette Dense, CRNA Patient Re-evaluated:Patient Re-evaluated prior to induction Oxygen Delivery Method: Circle system utilized Preoxygenation: Pre-oxygenation with 100% oxygen Induction Type: IV induction Ventilation: Mask ventilation without difficulty LMA: LMA inserted LMA Size: 4.0 Number of attempts: 1 Placement Confirmation: positive ETCO2 and breath sounds checked- equal and bilateral Tube secured with: Tape Dental Injury: Teeth and Oropharynx as per pre-operative assessment

## 2017-06-03 NOTE — Interval H&P Note (Signed)
History and Physical Interval Note:  06/03/2017 7:42 AM  Tabitha [name]  has presented today for surgery, with the diagnosis of right breast abscess  The various methods of treatment have been discussed with the patient and family. After consideration of risks, benefits and other options for treatment, the patient has consented to  Procedure(s): INCISION AND DRAINAGE RIGHT BREAST ABSCESS (Right) as a surgical intervention .  The patient's history has been reviewed, patient examined, no change in status, stable for surgery.  I have reviewed the patient's chart and labs.  Questions were answered to the patient's satisfaction.     Darene Lamer Prescious Hurless

## 2017-06-03 NOTE — Transfer of Care (Signed)
Immediate Anesthesia Transfer of Care Note  Patient: Tabitha [name]  Procedure(s) Performed: INCISION AND DRAINAGE RIGHT BREAST ABSCESS (Right )  Patient Location: PACU  Anesthesia Type:General  Level of Consciousness: awake, alert  and oriented  Airway & Oxygen Therapy: Patient Spontanous Breathing and Patient connected to face mask oxygen  Post-op Assessment: Report given to RN and Post -op Vital signs reviewed and stable  Post vital signs: Reviewed and stable  Last Vitals:  Vitals Value Taken Time  BP 140/79 06/03/2017  8:33 AM  Temp    Pulse 68 06/03/2017  8:34 AM  Resp 14 06/03/2017  8:34 AM  SpO2 100 % 06/03/2017  8:34 AM  Vitals shown include unvalidated device data.  Last Pain:  Vitals:   06/03/17 0645  TempSrc: Oral  PainSc: 6          Complications: No apparent anesthesia complications

## 2017-06-03 NOTE — Op Note (Signed)
Preoperative Diagnosis: right breast abscess  Postoprative Diagnosis: right breast abscess  Procedure: Procedure(s): INCISION AND DRAINAGE RIGHT BREAST ABSCESS   Surgeon: Excell Seltzer T   Assistants: None  Anesthesia:  General endotracheal anesthesia  Indications: Patient is a 62-year-old female with a history of right breast cancer and complex breast history including remote breast reduction, large core needle biopsy last year for abnormal mammogram resulting in a large hematoma and subsequent to lumpectomies for excision of an early stage cancer.  She had a seroma postoperative bleed requiring multiple aspirations and ultimately infected seroma which underwent incision and drainage in the office.  She however is continued to have increasing purulent drainage and increasing induration and redness in the lateral breast despite this and antibiotics and we have recommended incision and drainage in the operating room.  The indications and nature of surgery and risks were discussed preoperatively and she agrees to proceed.    Procedure Detail: Patient was brought to the operating room, placed in supine position on the operating table, and general endotracheal anesthesia induced.  The right breast was widely sterilely prepped and draped.  She was given preoperative IV antibiotics.  Patient timeout was performed and correct procedure verified.  There was a previous circumareolar incision inferiorly and laterally in the right breast with a small open area and frankly purulent drainage.  I was able to pass a Kelly through this into a deep cavity in the posterior lateral breast.  The circumareolar incision was opened for about 2-1/2 to 3 cm widely opening in to this cavity which was completely drained.  There was some shaggy exudate on the borders which was manually debrided.  I did not see any evidence of deeper tracking or extensive necrotic tissue.  This did track back toward the chest wall.  Once I  was confident that the cavity was widely opened and drained it was thoroughly irrigated with saline and packed with one-inch iodoform gauze.  Dry sterile dressing was applied.  Sponge needle and instrument counts were correct.    Findings: As above  Estimated Blood Loss:  Minimal         Drains: Wound packed with one-inch iodoform gauze  Blood Given: none          Specimens: None        Complications:  * No complications entered in OR log *         Disposition: PACU - hemodynamically stable.         Condition: stable

## 2017-06-03 NOTE — Plan of Care (Signed)
  Problem: Pain Managment: Goal: General experience of comfort will improve Outcome: Progressing   

## 2017-06-03 NOTE — Anesthesia Postprocedure Evaluation (Signed)
Anesthesia Post Note  Patient: Tabitha [name]  Procedure(s) Performed: INCISION AND DRAINAGE RIGHT BREAST ABSCESS (Right )     Patient location during evaluation: PACU Anesthesia Type: General Level of consciousness: awake and alert Pain management: pain level controlled Vital Signs Assessment: post-procedure vital signs reviewed and stable Respiratory status: spontaneous breathing, nonlabored ventilation, respiratory function stable and patient connected to nasal cannula oxygen Cardiovascular status: blood pressure returned to baseline and stable Postop Assessment: no apparent nausea or vomiting Anesthetic complications: no    Last Vitals:  Vitals:   06/03/17 0900 06/03/17 0915  BP: 125/74 115/74  Pulse: 67 70  Resp: 14 18  Temp:    SpO2: 99% 98%    Last Pain:  Vitals:   06/03/17 0915  TempSrc:   PainSc: 6                  [name]

## 2017-06-04 ENCOUNTER — Encounter (HOSPITAL_COMMUNITY): Payer: Self-pay | Admitting: General Surgery

## 2017-06-04 DIAGNOSIS — T8141XD Infection following a procedure, superficial incisional surgical site, subsequent encounter: Secondary | ICD-10-CM | POA: Diagnosis not present

## 2017-06-04 LAB — BASIC METABOLIC PANEL
ANION GAP: 8 (ref 5–15)
BUN: 18 mg/dL (ref 6–20)
CALCIUM: 9.4 mg/dL (ref 8.9–10.3)
CO2: 23 mmol/L (ref 22–32)
Chloride: 105 mmol/L (ref 101–111)
Creatinine, Ser: 1.01 mg/dL — ABNORMAL HIGH (ref 0.44–1.00)
GFR, EST NON AFRICAN AMERICAN: 59 mL/min — AB (ref >60)
Glucose, Bld: 143 mg/dL — ABNORMAL HIGH (ref 65–99)
POTASSIUM: 4.3 mmol/L (ref 3.5–5.1)
Sodium: 136 mmol/L (ref 135–145)

## 2017-06-04 LAB — GLUCOSE, CAPILLARY: Glucose-Capillary: 122 mg/dL — ABNORMAL HIGH (ref 65–99)

## 2017-06-04 LAB — CBC
HEMATOCRIT: 38.5 % (ref 36.0–46.0)
Hemoglobin: 12.7 g/dL (ref 12.0–15.0)
MCH: 29 pg (ref 26.0–34.0)
MCHC: 33 g/dL (ref 30.0–36.0)
MCV: 87.9 fL (ref 78.0–100.0)
PLATELETS: 244 10*3/uL (ref 150–400)
RBC: 4.38 MIL/uL (ref 3.87–5.11)
RDW: 12.6 % (ref 11.5–15.5)
WBC: 11.5 10*3/uL — AB (ref 4.0–10.5)

## 2017-06-04 NOTE — Progress Notes (Signed)
Patient ID: Tabitha [name], female   DOB: Nov 19, 1955, 62 y.o.   MRN: [id number] 1 Day Post-Op   Subjective: Some pain at surgical site.  No other complaints.  Objective: Vital signs in last 24 hours: Temp:  [97.9 F (36.6 C)-98.9 F (37.2 C)] 97.9 F (36.6 C) (04/28 0517) Pulse Rate:  [56-86] 56 (04/28 0517) Resp:  [12-20] 18 (04/28 0517) BP: (88-121)/(47-73) 88/47 (04/28 0813) SpO2:  [96 %-100 %] 98 % (04/28 0517)    Intake/Output from previous day: 04/27 0701 - 04/28 0700 In: 2390 [P.O.:1440; I.V.:700; IV Piggyback:250] Out: 3125 [Urine:3100; Blood:25] Intake/Output this shift: No intake/output data recorded.  General appearance: alert, cooperative and no distress Breasts: Right breast with significantly less induration and erythema.  Packing was removed without difficulty and wound repacked.  Lab Results:  Recent Labs    06/02/17 0917 06/04/17 0432  WBC 8.4 11.5*  HGB 14.9 12.7  HCT 44.5 38.5  PLT 236 244   BMET Recent Labs    06/02/17 0917 06/04/17 0432  NA 138 136  K 4.2 4.3  CL 103 105  CO2 24 23  GLUCOSE 130* 143*  BUN 9 18  CREATININE 0.75 1.01*  CALCIUM 9.2 9.4   No results found for this or any previous visit (from the past 240 hour(s)).  Studies/Results: No results found.  Anti-infectives: Anti-infectives (From admission, onward)   Start     Dose/Rate Route Frequency Ordered Stop   06/03/17 1200  sulfamethoxazole-trimethoprim (BACTRIM DS,SEPTRA DS) 800-160 MG per tablet 1 tablet     1 tablet Oral 2 times daily 06/03/17 0958     06/03/17 1000  vancomycin (VANCOCIN) 1,000 mg in sodium chloride 0.9 % 250 mL IVPB  Status:  Discontinued     1,000 mg 250 mL/hr over 60 Minutes Intravenous Every 12 hours 06/03/17 0801 06/03/17 0906   06/03/17 0748  vancomycin (VANCOCIN) 1-5 GM/200ML-% IVPB    Note to Pharmacy:  Guerry Bruin   : cabinet override      06/03/17 0748 06/03/17 1959   06/03/17 0738  vancomycin (VANCOCIN) 1-5 GM/200ML-% IVPB    Note  to Pharmacy:  Danley Danker   : cabinet override      06/03/17 0738 06/03/17 1944      Assessment/Plan: s/p Procedure(s): INCISION AND DRAINAGE RIGHT BREAST ABSCESS Doing well postoperatively.  Okay for discharge.  I instructed the patient's daughter in wound care and she is comfortable with this.   LOS: 0 days    Edward Jolly 06/04/2017

## 2017-06-04 NOTE — Progress Notes (Signed)
Discharge instructions reviewed with the patient. All questions answered. Patient wheeled to vehicle with belongings by nurse tech

## 2017-06-04 NOTE — Discharge Instructions (Signed)
Loosely packed open wound with moist saline gauze daily.  May shower.  Continue current antibiotics until gone. Call as needed for increased pain, redness, drainage, fever or other concerns

## 2017-06-04 NOTE — Discharge Summary (Signed)
Patient ID: Tabitha [name] [id number] 62 y.o. 12/30/1955  06/03/2017  Discharge date and time: 06/04/2017   Admitting Physician: Edward Jolly  Discharge Physician: Edward Jolly  Admission Diagnoses: right breast abscess  Discharge Diagnoses: Same  Operations: Procedure(s): INCISION AND DRAINAGE RIGHT BREAST ABSCESS  Admission Condition: fair  Discharged Condition: good  Indication for Admission: Patient is status post right breast lumpectomy for early stage cancer last fall.  She had a large preoperative hematoma from her core needle biopsy.  She subsequently developed a seroma that has been aspirated.  This became infected and she underwent a limited I&D in the office about 3 weeks ago.  She has developed increasing redness and swelling and drainage at the site and is electively admitted following incision and drainage in the operating room  Hospital Course: On the morning of admission the patient underwent incision and drainage of a moderate-sized right breast abscess.  This was packed.  She tolerated this well.  The following morning there is much less redness and swelling to the breast.  Packing was changed.  Wound is clean.  She is felt ready for discharge.    Disposition: Home  Patient Instructions:  Allergies as of 06/04/2017      Reactions   Ampicillin Hives, Swelling   SWELLING REACTION UNSPECIFIED    Barium-containing Compounds Hives, Swelling   SWELLING REACTION UNSPECIFIED    Penicillins Anaphylaxis, Hives, Swelling   PATIENT HAS HAD A PCN REACTION WITH IMMEDIATE RASH, FACIAL/TONGUE/THROAT SWELLING, SOB, OR LIGHTHEADEDNESS WITH HYPOTENSION:  #  #  #  YES  #  #  #  HAS PT DEVELOPED SEVERE RASH INVOLVING MUCUS MEMBRANES or SKIN NECROSIS: #  #  #  YES  #  #  #  Has patient had a PCN reaction that required hospitalization: No Has patient had a PCN reaction occurring within the last 10 years: No   Morphine And Related Hives   Hives, rash , bad itch       Medication List    TAKE these medications   acetaminophen 325 MG tablet Commonly known as:  TYLENOL Take 325 mg by mouth daily as needed for moderate pain.   alendronate 70 MG tablet Commonly known as:  FOSAMAX Take 70 mg by mouth every Monday. Take with a full glass of water on an empty stomach.   ALPRAZolam 0.5 MG tablet Commonly known as:  XANAX Take 0.5 mg by mouth at bedtime as needed for anxiety or sleep.   amLODipine 5 MG tablet Commonly known as:  NORVASC Take 5 mg by mouth daily.   anastrozole 1 MG tablet Commonly known as:  ARIMIDEX TAKE ONE TABLET BY MOUTH DAILY What changed:    how much to take  how to take this  when to take this   atorvastatin 20 MG tablet Commonly known as:  LIPITOR Take 20 mg by mouth every evening.   CREON 24000-76000 units Cpep Generic drug:  Pancrelipase (Lip-Prot-Amyl) Take 2 capsules by mouth 3 (three) times daily before meals.   hydrOXYzine 25 MG capsule Commonly known as:  VISTARIL Take 25 mg by mouth 3 (three) times daily as needed for itching.   lisinopril 40 MG tablet Commonly known as:  PRINIVIL,ZESTRIL Take 40 mg by mouth daily.   metFORMIN 500 MG tablet Commonly known as:  GLUCOPHAGE Take 500 mg by mouth 2 (two) times daily.   Oxycodone HCl 10 MG Tabs Take 10 mg by mouth 3 (three) times daily.   sulfamethoxazole-trimethoprim  800-160 MG tablet Commonly known as:  BACTRIM DS,SEPTRA DS Take 1 tablet by mouth 2 (two) times daily.   triamterene-hydrochlorothiazide 75-50 MG tablet Commonly known as:  MAXZIDE Take 1 tablet by mouth daily.       Activity: activity as tolerated Diet: regular diet Wound Care: Pack wound daily with moist saline gauze, family instructed  Follow-up:  With Dr. Excell Seltzer in 3 weeks.  Signed: Edward Jolly MD, FACS  06/04/2017, 9:21 AM

## 2017-06-04 NOTE — Care Management Note (Signed)
Case Management Note  Patient Details  Name: Tabitha [name] MRN: [id number] Date of Birth: [birth year]  Subjective/Objective:     right breast abscess, I&D right breast adscess              Action/Plan: NCM spoke to pt and friend, Colletta [place] at bedside. Pt states she has supplies at home to do dressing changes. Her friend is a Therapist, sports and will be able to assist. Wellsite geologist has reviewed with her how to do dressing changes at home. No HHRN needed.   Expected Discharge Date:  06/04/17               Expected Discharge Plan:  Home/Self Care  In-House Referral:  NA  Discharge planning Services  CM Consult  Post Acute Care Choice:  NA Choice offered to:  NA  DME Arranged:  N/A DME Agency:  NA  HH Arranged:  NA HH Agency:  NA  Status of Service:  Completed, signed off  If discussed at [place] of Stay Meetings, dates discussed:    Additional Comments:  Erenest Rasher, RN 06/04/2017, 10:12 AM

## 2017-06-07 ENCOUNTER — Encounter (HOSPITAL_COMMUNITY): Payer: Self-pay | Admitting: General Surgery

## 2017-06-29 ENCOUNTER — Other Ambulatory Visit: Payer: PPO

## 2017-07-05 DIAGNOSIS — Z170 Estrogen receptor positive status [ER+]: Secondary | ICD-10-CM | POA: Diagnosis not present

## 2017-07-05 DIAGNOSIS — Z01419 Encounter for gynecological examination (general) (routine) without abnormal findings: Secondary | ICD-10-CM | POA: Diagnosis not present

## 2017-07-05 DIAGNOSIS — R2232 Localized swelling, mass and lump, left upper limb: Secondary | ICD-10-CM | POA: Diagnosis not present

## 2017-07-17 DIAGNOSIS — E119 Type 2 diabetes mellitus without complications: Secondary | ICD-10-CM | POA: Diagnosis not present

## 2017-07-17 DIAGNOSIS — H10023 Other mucopurulent conjunctivitis, bilateral: Secondary | ICD-10-CM | POA: Diagnosis not present

## 2017-07-17 DIAGNOSIS — Z6829 Body mass index (BMI) 29.0-29.9, adult: Secondary | ICD-10-CM | POA: Diagnosis not present

## 2017-07-17 DIAGNOSIS — F411 Generalized anxiety disorder: Secondary | ICD-10-CM | POA: Diagnosis not present

## 2017-07-17 DIAGNOSIS — M545 Low back pain: Secondary | ICD-10-CM | POA: Diagnosis not present

## 2017-07-17 DIAGNOSIS — I10 Essential (primary) hypertension: Secondary | ICD-10-CM | POA: Diagnosis not present

## 2017-07-24 DIAGNOSIS — H353191 Nonexudative age-related macular degeneration, unspecified eye, early dry stage: Secondary | ICD-10-CM | POA: Diagnosis not present

## 2017-07-24 DIAGNOSIS — H40053 Ocular hypertension, bilateral: Secondary | ICD-10-CM | POA: Diagnosis not present

## 2017-07-24 DIAGNOSIS — H2513 Age-related nuclear cataract, bilateral: Secondary | ICD-10-CM | POA: Diagnosis not present

## 2017-07-24 DIAGNOSIS — E119 Type 2 diabetes mellitus without complications: Secondary | ICD-10-CM | POA: Diagnosis not present

## 2017-07-27 IMAGING — MR MR BILATERAL BREAST WITHOUT AND WITH CONTRAST
8 of 12 series · 30 of 48 positions shown · IV contrast (18ml Multihance)
Comparison: Previous exam(s).

CLINICAL DATA: 61-year-old female status post right lumpectomy in
June 2016 for invasive ductal carcinoma of the right breast.
Pathology from the right lumpectomy demonstrated no in situ or
invasive carcinoma. The patient return for targeted ultrasound on
August 01, 2016 to evaluate for any residual carcinoma. Postsurgical
fluid collections were seen in the right breast and axilla; however,
no definite mass was identified to correspond to the originally seen
carcinoma. MRI was recommended to assess for residual disease.

LABS:  None.
EXAM:
BILATERAL BREAST MRI WITH AND WITHOUT CONTRAST
TECHNIQUE: Multiplanar, multisequence MR images of both breasts were obtained
prior to and following the intravenous administration of 18 ml of
MultiHance.

[Series 3: t2_tirm_tra ipat (a-p) · axial · 3.0mm · 0.74mm/px · 1 of 60 slices shown]
[im 1/60]
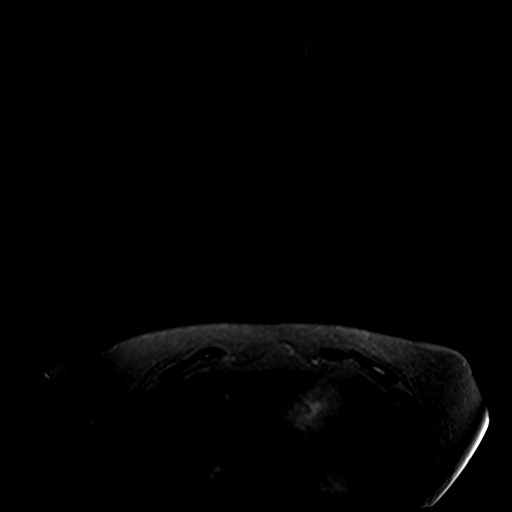

[Series 4: fl3d pre-cm no · axial · non-contrast · 1.2mm · 0.99mm/px · z∈[-133,+58]mm · 5 of 160 slices shown]
[im 1/160]
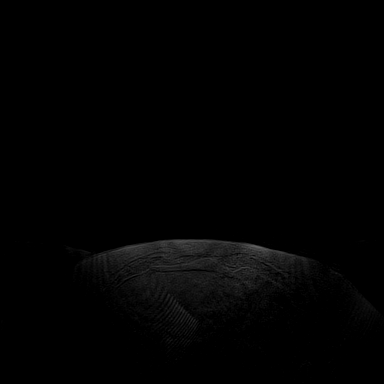
[im 40/160]
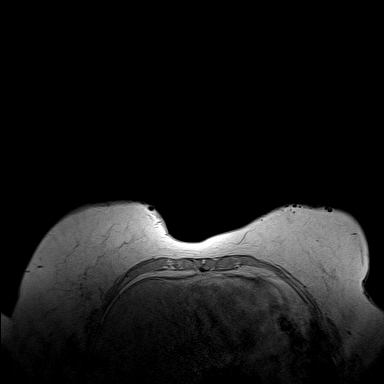
[im 80/160]
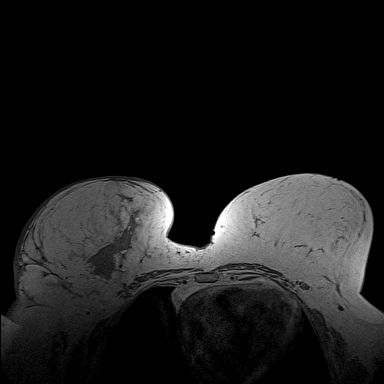
[im 120/160]
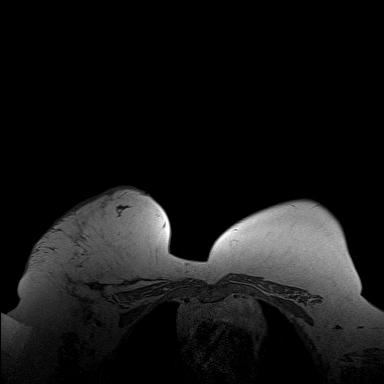
[im 160/160]
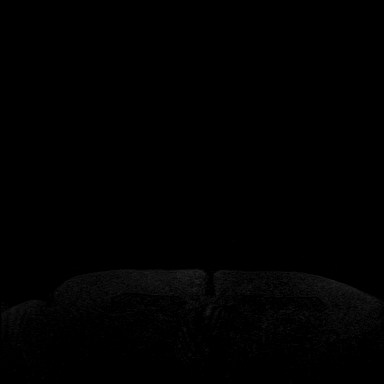

[Series 5: fl3d pre-cm · axial · non-contrast · 1.2mm · 0.99mm/px · z∈[-133,+58]mm · 5 of 160 slices shown]
[im 1/160]
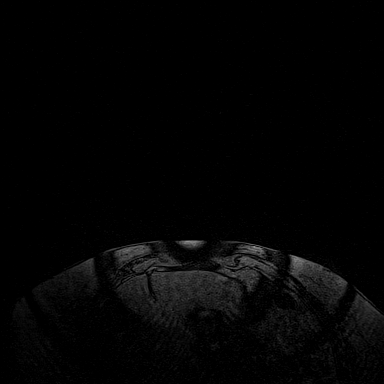
[im 40/160]
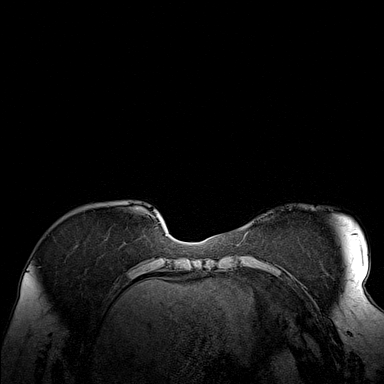
[im 80/160]
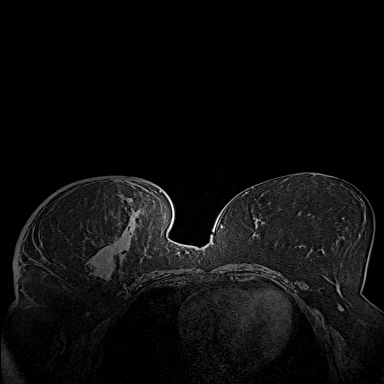
[im 120/160]
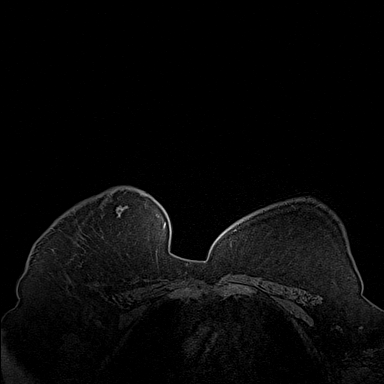
[im 160/160]
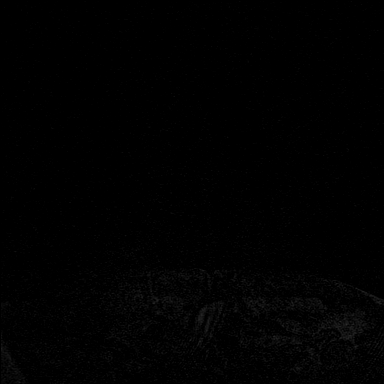

[Series 6: fl3d post-cm 20 · axial · 1.2mm · 0.99mm/px · z∈[-133,+58]mm · 5 of 160 slices shown (1 of 3)]
[im 1/160]
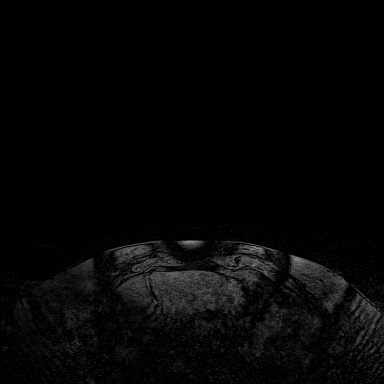
[im 40/160]
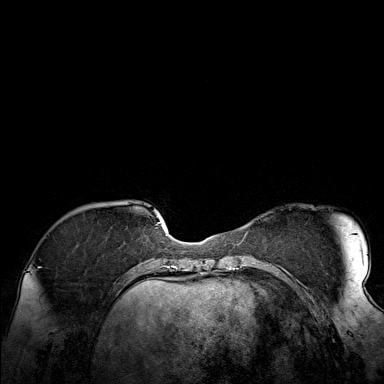
[im 80/160]
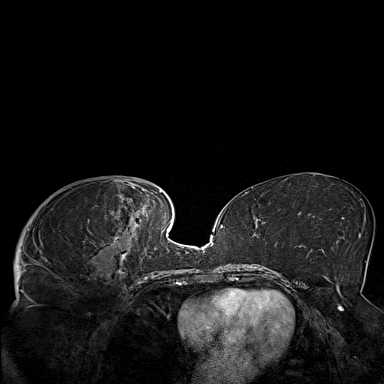
[im 120/160]
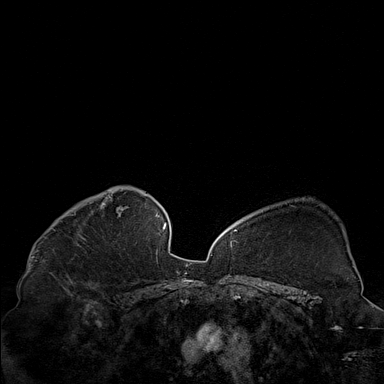
[im 160/160]
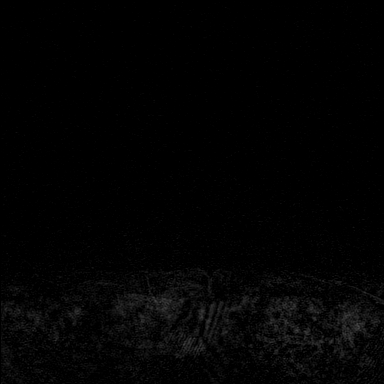

[Series 7: fl3d post-cm 20 · axial · 1.2mm · 0.99mm/px · z∈[-133,+58]mm · 5 of 160 slices shown (2 of 3)]
[im 1/160]
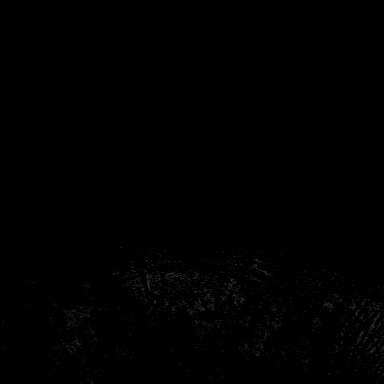
[im 40/160]
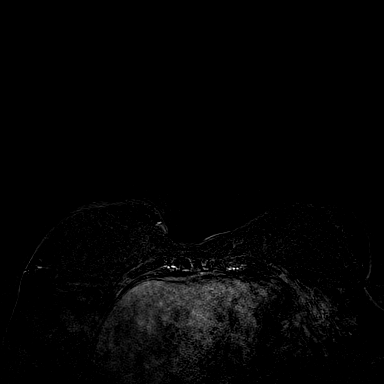
[im 80/160]
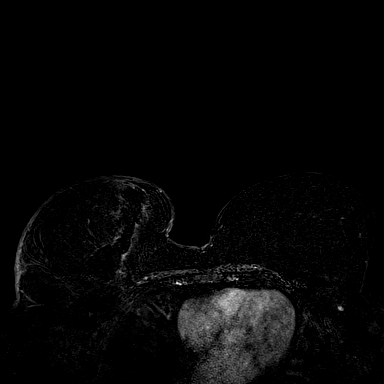
[im 120/160]
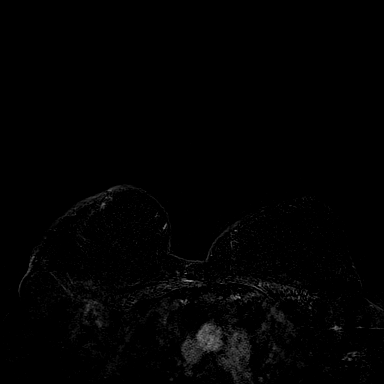
[im 160/160]
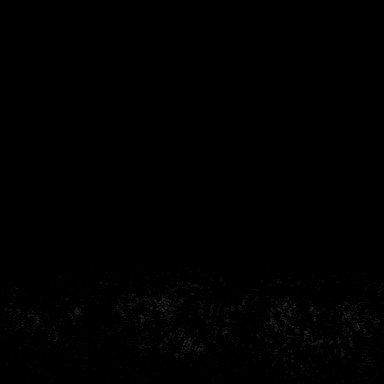

[Series 8: fl3d post-cm 20 · axial · 192.0mm · 0.99mm/px · 1 of 1 slices shown (3 of 3)]
[im 1/1]
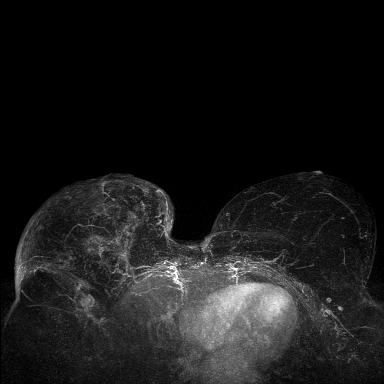

[Series 9: fl3d post-cm 3min · axial · 1.2mm · 0.99mm/px · z∈[-133,+58]mm · 6 of 160 slices shown]
[im 1/160]
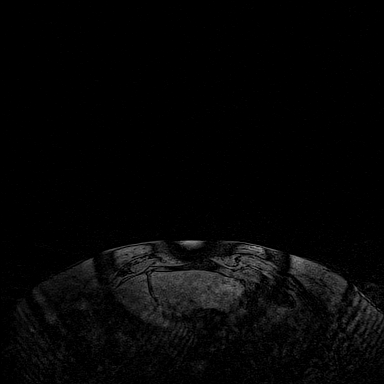
[im 32/160]
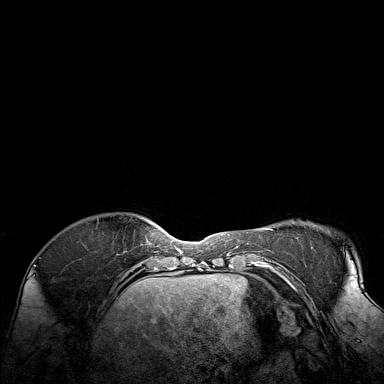
[im 64/160]
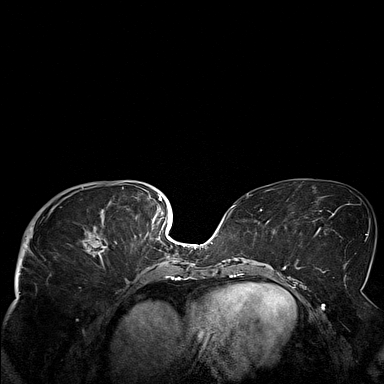
[im 96/160]
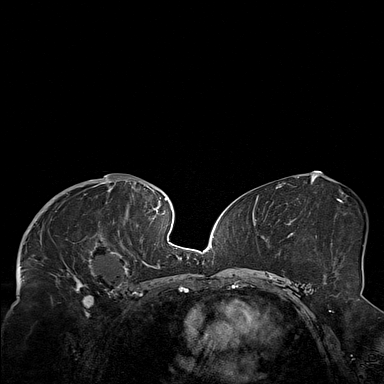
[im 128/160]
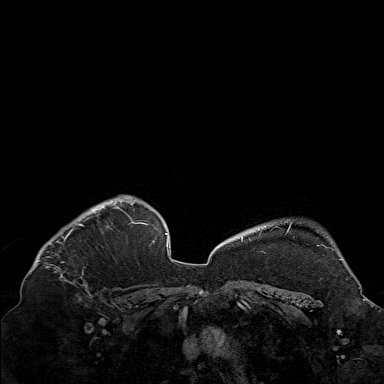
[im 160/160]
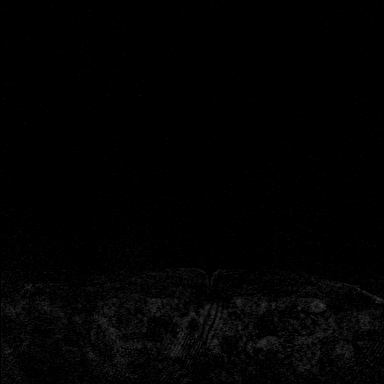

[Series 10: fl3d post-cm 3min_sub · axial · 1.2mm · 0.99mm/px · z∈[-133,-96]mm · 2 of 160 slices shown]
[im 1/160]
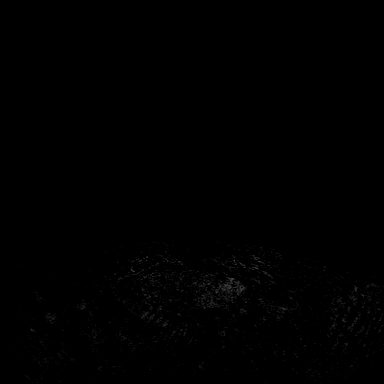
[im 32/160]
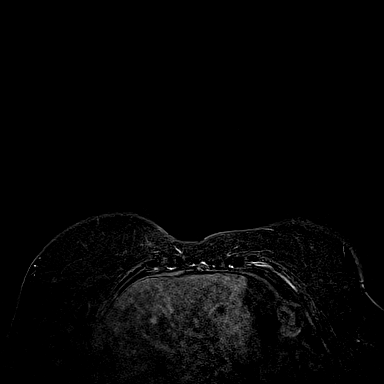

[30 of 48 positions shown; findings below may reference images not displayed]

THREE-DIMENSIONAL MR IMAGE RENDERING ON INDEPENDENT WORKSTATION:

Three-dimensional MR images were rendered by post-processing of the
original MR data on an independent workstation. The
three-dimensional MR images were interpreted, and findings are
reported in the following complete MRI report for this study. Three
dimensional images were evaluated at the independent [employer]
workstation
FINDINGS: Breast composition: b. Scattered fibroglandular tissue.

Background parenchymal enhancement: Minimal.

Right breast: A peripherally enhancing postsurgical fluid collection
is seen within the central, posterior right breast extending to the
middle and anterior depth. Inferior and slightly lateral to this
collection within the lower, outer right breast, there is a small
1.2 cm hematoma thought to represent the original post biopsy
hematoma from the biopsy performed on June 08, 2016. Biopsy marker
artifact is noted along the medial aspect of this hematoma (series
10, image 93), thought to represent the artifact from the originally
placed coil shaped biopsy marker. Similar-appearing peripheral
enhancement is noted about the postsurgical fluid collections and
original post biopsy hematoma. A small focus of residual carcinoma
cannot be excluded within the right breast.

Left breast: No suspicious mass or enhancement.

Lymph nodes: A small peripherally enhancing postsurgical fluid
collection is seen within the right axilla from prior sentinel node
biopsy. No suspicious appearing left axillary or internal mammary
lymph nodes are identified.

Ancillary findings:  None.
IMPRESSION: 1. New postsurgical changes of the central, posterior right breast
and right axilla. There is a separate small hematoma within the
lower, outer right breast which is thought to represent residual
hematoma from the original stereotactic guided right breast biopsy.
Clip artifact is seen along the medial aspect of this hematoma,
thought to be from the originally placed coil shaped biopsy marker.
Similar-appearing peripheral enhancement is noted about the
postsurgical fluid collections and original post biopsy hematoma.
Residual carcinoma cannot be excluded within the right breast.
2. No suspicious enhancement is identified within the left breast.

RECOMMENDATION:
If additional surgery of the right breast is planned, I would
recommend localization of the coil shaped biopsy marker within the
right breast as this is felt to be the most likely site of any
residual carcinoma after review of all of the patient's prior
imaging. If the patient is able to tolerate compression, additional
evaluation of the right breast with a diagnostic mammogram prior to
surgery would be recommended to assess for any residual mass
mammographically and to aid in future localization of the coil
shaped biopsy marker if desired.

BI-RADS CATEGORY  6: Known biopsy-proven malignancy.

## 2017-08-09 DIAGNOSIS — L501 Idiopathic urticaria: Secondary | ICD-10-CM | POA: Diagnosis not present

## 2017-08-09 DIAGNOSIS — Z6829 Body mass index (BMI) 29.0-29.9, adult: Secondary | ICD-10-CM | POA: Diagnosis not present

## 2017-08-29 ENCOUNTER — Other Ambulatory Visit: Payer: Self-pay | Admitting: General Surgery

## 2017-08-29 DIAGNOSIS — N611 Abscess of the breast and nipple: Secondary | ICD-10-CM

## 2017-09-01 ENCOUNTER — Telehealth: Payer: Self-pay

## 2017-09-01 NOTE — Telephone Encounter (Signed)
Returned call to pt regarding she was confirming her upcoming appts with the breast center and Dr Lindi Adie.  No other needs per pt at this time.

## 2017-09-05 ENCOUNTER — Other Ambulatory Visit: Payer: Self-pay | Admitting: General Surgery

## 2017-09-05 ENCOUNTER — Telehealth: Payer: Self-pay | Admitting: Hematology and Oncology

## 2017-09-05 ENCOUNTER — Ambulatory Visit
Admission: RE | Admit: 2017-09-05 | Discharge: 2017-09-05 | Disposition: A | Discharge status: Home / Self Care | Payer: PPO | Source: Ambulatory Visit | Attending: General Surgery | Admitting: General Surgery

## 2017-09-05 ENCOUNTER — Inpatient Hospital Stay: Payer: PPO | Attending: Hematology and Oncology | Admitting: Hematology and Oncology

## 2017-09-05 DIAGNOSIS — N611 Abscess of the breast and nipple: Secondary | ICD-10-CM

## 2017-09-05 DIAGNOSIS — R928 Other abnormal and inconclusive findings on diagnostic imaging of breast: Secondary | ICD-10-CM | POA: Diagnosis not present

## 2017-09-05 DIAGNOSIS — Z923 Personal history of irradiation: Secondary | ICD-10-CM | POA: Diagnosis not present

## 2017-09-05 DIAGNOSIS — Z79810 Long term (current) use of selective estrogen receptor modulators (SERMs): Secondary | ICD-10-CM

## 2017-09-05 DIAGNOSIS — C50511 Malignant neoplasm of lower-outer quadrant of right female breast: Secondary | ICD-10-CM | POA: Insufficient documentation

## 2017-09-05 DIAGNOSIS — N6489 Other specified disorders of breast: Secondary | ICD-10-CM | POA: Diagnosis not present

## 2017-09-05 DIAGNOSIS — Z79811 Long term (current) use of aromatase inhibitors: Secondary | ICD-10-CM | POA: Diagnosis not present

## 2017-09-05 DIAGNOSIS — Z170 Estrogen receptor positive status [ER+]: Secondary | ICD-10-CM | POA: Insufficient documentation

## 2017-09-05 DIAGNOSIS — Z853 Personal history of malignant neoplasm of breast: Secondary | ICD-10-CM | POA: Diagnosis not present

## 2017-09-05 HISTORY — DX: Personal history of irradiation: Z92.3

## 2017-09-05 MED ORDER — ANASTROZOLE 1 MG PO TABS: 1.0000 mg | ORAL_TABLET | Freq: Every day | ORAL | 3 refills | Status: DC

## 2017-09-05 NOTE — Progress Notes (Signed)
Patient Care Team: Neale Burly, MD as PCP - General (Internal Medicine) Excell Seltzer, MD as Consulting Physician (General Surgery) Nicholas Lose, MD as Consulting Physician (Hematology and Oncology) Eppie Gibson, MD as Attending Physician (Radiation Oncology) Gardenia Phlegm, NP as Nurse Practitioner (Hematology and Oncology) Audry Pili, MD as Referring Physician (Radiation Oncology)  DIAGNOSIS:  Encounter Diagnosis  Name Primary?  . Malignant neoplasm of lower-outer quadrant of right breast of female, estrogen receptor positive ([place])     SUMMARY OF ONCOLOGIC HISTORY:   Malignant neoplasm of lower-outer quadrant of right breast of female, estrogen receptor positive ([place])   06/08/2016 Initial Diagnosis    Right breast asymmetry posteriorly 7 mm size, no ultrasound correlate, AFFIRM biopsy: Grade 2 invasive ductal carcinoma ER 100%, PR 95%, Ki-67 10%, HER-2 negative ratio 1.58, T1 BN 0 stage IA clinical stage      06/15/2016 Genetic Testing    Genetic testing did not reveal a deleterious mutation.  Variants of uncertain significance (VUSs) called DICER1 c.1468C>T (p.Arg490Cys) and MSH3 c.2041C>T (p.Pro681Ser) were also noted.  Genes tested include: APC, ATM, AXIN2, BARD1, BMPR1A, BRCA1, BRCA2, BRIP1, CDH1, CDKN2A, CHEK2, CTNNA1, DICER1, EPCAM, GREM1, HOXB13, KIT, MEN1, MLH1, MSH2, MSH3, MSH6, MUTYH, NBN, NF1, NTHL1, PALB2, PDGFRA, PMS2, POLD1, POLE, PTEN, RAD50, RAD51C, RAD51D, SDHA, SDHB, SDHC, SDHD, SMAD4, SMARCA4, STK11, TP53, TSC1, TSC2, and VHL.      07/06/2016 Surgery    Right lumpectomy: No tumor noted on the resected specimen      09/14/2016 Surgery    Right lumpectomy: Small residual focus of IDC grade 1, 0.2 cm, margins negative, ER 100%, PR 95%, HER-2 negative, Ki-67 10%, T1a N0 stage IA      10/27/2016 - 11/15/2016 Radiation Therapy    Adjuvant radiation with Dr. Sherryll Burger breast: 42.4 Gy in 16 treatments, Right breast boost: 10 Gy in 4  treatments      11/2016 -  Anti-estrogen oral therapy    Anastrozole daily      05/26/2017 Surgery    Breast abscess: Surgery performed       CHIEF COMPLIANT: Breast wound healing issues  INTERVAL HISTORY: Tabitha [name] is a 62 year old with above-mentioned history of right breast cancer treated with lumpectomy followed by radiation and is currently on anastrozole therapy.  She is tolerating anastrozole extremely well.  She is having multiple problems since the breast surgery with a nonhealing wound i and she even had a breast abscess for which she underwent surgery in May 26, 2017.  She is getting the wound packed by wound care nurse.  She is very unhappy about her breast situation.  REVIEW OF SYSTEMS:   Constitutional: Denies fevers, chills or abnormal weight loss Eyes: Denies blurriness of vision Ears, nose, mouth, throat, and face: Denies mucositis or sore throat Respiratory: Denies cough, dyspnea or wheezes Cardiovascular: Denies palpitation, chest discomfort Gastrointestinal:  Denies nausea, heartburn or change in bowel habits Skin: Denies abnormal skin rashes Lymphatics: Denies new lymphadenopathy or easy bruising Neurological:Denies numbness, tingling or new weaknesses Behavioral/Psych: Mood is stable, no new changes  Extremities: No lower extremity edema Breast: Nonhealing breast wound All other systems were reviewed with the patient and are negative.  I have reviewed the past medical history, past surgical history, social history and family history with the patient and they are unchanged from previous note.  ALLERGIES:  is allergic to ampicillin; barium-containing compounds; penicillins; and morphine and related.  MEDICATIONS:  Current Outpatient Medications  Medication Sig Dispense Refill  .  acetaminophen (TYLENOL) 325 MG tablet Take 325 mg by mouth daily as needed for moderate pain.     Marland Kitchen alendronate (FOSAMAX) 70 MG tablet Take 70 mg by mouth every Monday. Take  with a full glass of water on an empty stomach.     . ALPRAZolam (XANAX) 0.5 MG tablet Take 0.5 mg by mouth at bedtime as needed for anxiety or sleep.     Marland Kitchen amLODipine (NORVASC) 5 MG tablet Take 5 mg by mouth daily.    Marland Kitchen anastrozole (ARIMIDEX) 1 MG tablet TAKE ONE TABLET BY MOUTH DAILY (Patient taking differently: TAKE ONE TABLET BY MOUTH DAILY TAKES IN EVENING) 90 tablet 3  . atorvastatin (LIPITOR) 20 MG tablet Take 20 mg by mouth every evening.     . hydrOXYzine (VISTARIL) 25 MG capsule Take 25 mg by mouth 3 (three) times daily as needed for itching.    Marland Kitchen lisinopril (PRINIVIL,ZESTRIL) 40 MG tablet Take 40 mg by mouth daily.    . metFORMIN (GLUCOPHAGE) 500 MG tablet Take 500 mg by mouth 2 (two) times daily.     . Oxycodone HCl 10 MG TABS Take 10 mg by mouth 3 (three) times daily.     . Pancrelipase, Lip-Prot-Amyl, (CREON) 24000-76000 units CPEP Take 2 capsules by mouth 3 (three) times daily before meals.     Marland Kitchen sulfamethoxazole-trimethoprim (BACTRIM DS,SEPTRA DS) 800-160 MG tablet Take 1 tablet by mouth 2 (two) times daily.  0  . triamterene-hydrochlorothiazide (MAXZIDE) 75-50 MG tablet Take 1 tablet by mouth daily.     No current facility-administered medications for this visit.     PHYSICAL EXAMINATION: ECOG PERFORMANCE STATUS: 1 - Symptomatic but completely ambulatory  Vitals:   09/05/17 0950  BP: (!) 142/83  Pulse: 70  Resp: 19  Temp: 98 F (36.7 C)  SpO2: 100%   Filed Weights   09/05/17 0950  Weight: 169 lb 9.6 oz (76.9 kg)    GENERAL:alert, no distress and comfortable SKIN: skin color, texture, turgor are normal, no rashes or significant lesions EYES: normal, Conjunctiva are pink and non-injected, sclera clear OROPHARYNX:no exudate, no erythema and lips, buccal mucosa, and tongue normal  NECK: supple, thyroid normal size, non-tender, without nodularity LYMPH:  no palpable lymphadenopathy in the cervical, axillary or inguinal LUNGS: clear to auscultation and percussion  with normal breathing effort HEART: regular rate & rhythm and no murmurs and no lower extremity edema ABDOMEN:abdomen soft, non-tender and normal bowel sounds MUSCULOSKELETAL:no cyanosis of digits and no clubbing  NEURO: alert & oriented x 3 with fluent speech, no focal motor/sensory deficits EXTREMITIES: No lower extremity edema   LABORATORY DATA:  I have reviewed the data as listed CMP Latest Ref Rng & Units 06/04/2017 06/02/2017 09/05/2016  Glucose 65 - 99 mg/dL 143(H) 130(H) 130(H)  BUN 6 - 20 mg/dL '18 9 8  ' Creatinine 0.44 - 1.00 mg/dL 1.01(H) 0.75 0.73  Sodium 135 - 145 mmol/L 136 138 138  Potassium 3.5 - 5.1 mmol/L 4.3 4.2 4.2  Chloride 101 - 111 mmol/L 105 103 106  CO2 22 - 32 mmol/L '23 24 24  ' Calcium 8.9 - 10.3 mg/dL 9.4 9.2 9.4  Total Protein 6.5 - 8.1 g/dL - 7.1 -  Total Bilirubin 0.3 - 1.2 mg/dL - 0.7 -  Alkaline Phos 38 - 126 U/L - 69 -  AST 15 - 41 U/L - 19 -  ALT 14 - 54 U/L - 18 -    Lab Results  Component Value Date   WBC 11.5 (H)  06/04/2017   HGB 12.7 06/04/2017   HCT 38.5 06/04/2017   MCV 87.9 06/04/2017   PLT 244 06/04/2017   NEUTROABS 5.9 06/15/2016    ASSESSMENT & PLAN:  Malignant neoplasm of lower-outer quadrant of right breast of female, estrogen receptor positive ([place]) 09/14/2016 Right lumpectomy: Small residual focus of IDC grade 1, 0.2 cm, margins negative, ER 100%, PR 95%, HER-2 negative, Ki-67 10%, T1a N0 stage IA (07/05/2016: Breast conserving surgery: No tumor detected on the pathological evaluation.) Adjuvant radiation therapy done in [facility]  Current treatment: Adjuvant antiestrogen therapy with anastrozole 1 mg daily started October 2018  Anastrozole toxicities: Occasional hot flashes denies any myalgias  Breast cancer surveillance: 1.  Breast exam nonhealing breast wound 2. Mammogram 09/05/2017: Benign  Return to clinic in 1 year for follow-up    No orders of the defined types were placed in this encounter.  The patient has a good  understanding of the overall plan. she agrees with it. she will call with any problems that may develop before the next visit here.   Harriette Ohara, MD 09/05/17

## 2017-09-05 NOTE — Assessment & Plan Note (Signed)
09/14/2016 Right lumpectomy: Small residual focus of IDC grade 1, 0.2 cm, margins negative, ER 100%, PR 95%, HER-2 negative, Ki-67 10%, T1a N0 stage IA (07/05/2016: Breast conserving surgery: No tumor detected on the pathological evaluation.) Adjuvant radiation therapy done in [facility]  Current treatment: Adjuvant antiestrogen therapy with anastrozole 1 mg daily started October 2018  Anastrozole toxicities:  Breast cancer surveillance: 1.  Breast exam 09/05/2017: Benign  2. Mammogram being done today  Return to clinic in 1 year for follow-up

## 2017-09-05 NOTE — Telephone Encounter (Signed)
Gave patient avs and calendar of upcoming appts.  °

## 2017-09-19 DIAGNOSIS — Z6829 Body mass index (BMI) 29.0-29.9, adult: Secondary | ICD-10-CM | POA: Diagnosis not present

## 2017-09-19 DIAGNOSIS — L501 Idiopathic urticaria: Secondary | ICD-10-CM | POA: Diagnosis not present

## 2017-09-19 DIAGNOSIS — Z6828 Body mass index (BMI) 28.0-28.9, adult: Secondary | ICD-10-CM | POA: Diagnosis not present

## 2017-09-19 DIAGNOSIS — M545 Low back pain: Secondary | ICD-10-CM | POA: Diagnosis not present

## 2017-10-13 DIAGNOSIS — E1142 Type 2 diabetes mellitus with diabetic polyneuropathy: Secondary | ICD-10-CM | POA: Diagnosis not present

## 2017-10-13 DIAGNOSIS — I10 Essential (primary) hypertension: Secondary | ICD-10-CM | POA: Diagnosis not present

## 2017-10-13 DIAGNOSIS — K861 Other chronic pancreatitis: Secondary | ICD-10-CM | POA: Diagnosis not present

## 2017-10-13 DIAGNOSIS — E7849 Other hyperlipidemia: Secondary | ICD-10-CM | POA: Diagnosis not present

## 2017-10-19 DIAGNOSIS — S21001A Unspecified open wound of right breast, initial encounter: Secondary | ICD-10-CM | POA: Diagnosis not present

## 2017-10-19 DIAGNOSIS — T8189XA Other complications of procedures, not elsewhere classified, initial encounter: Secondary | ICD-10-CM | POA: Diagnosis not present

## 2017-10-19 DIAGNOSIS — Z923 Personal history of irradiation: Secondary | ICD-10-CM | POA: Diagnosis not present

## 2017-10-19 DIAGNOSIS — Z853 Personal history of malignant neoplasm of breast: Secondary | ICD-10-CM | POA: Diagnosis not present

## 2017-11-07 ENCOUNTER — Other Ambulatory Visit: Payer: Self-pay | Admitting: General Surgery

## 2017-11-07 ENCOUNTER — Other Ambulatory Visit: Payer: Self-pay

## 2017-11-07 DIAGNOSIS — N611 Abscess of the breast and nipple: Secondary | ICD-10-CM

## 2017-11-08 ENCOUNTER — Other Ambulatory Visit: Payer: Self-pay

## 2017-11-08 ENCOUNTER — Other Ambulatory Visit: Payer: Self-pay | Admitting: General Surgery

## 2017-11-08 DIAGNOSIS — N611 Abscess of the breast and nipple: Secondary | ICD-10-CM

## 2017-11-09 ENCOUNTER — Ambulatory Visit
Admission: RE | Admit: 2017-11-09 | Discharge: 2017-11-09 | Disposition: A | Discharge status: Home / Self Care | Payer: PPO | Source: Ambulatory Visit | Attending: General Surgery | Admitting: General Surgery

## 2017-11-09 DIAGNOSIS — N611 Abscess of the breast and nipple: Secondary | ICD-10-CM

## 2017-11-09 DIAGNOSIS — N6489 Other specified disorders of breast: Secondary | ICD-10-CM | POA: Diagnosis not present

## 2017-11-09 DIAGNOSIS — Z853 Personal history of malignant neoplasm of breast: Secondary | ICD-10-CM | POA: Diagnosis not present

## 2017-11-09 DIAGNOSIS — R928 Other abnormal and inconclusive findings on diagnostic imaging of breast: Secondary | ICD-10-CM | POA: Diagnosis not present

## 2017-11-20 DIAGNOSIS — Z6829 Body mass index (BMI) 29.0-29.9, adult: Secondary | ICD-10-CM | POA: Diagnosis not present

## 2017-11-20 DIAGNOSIS — M545 Low back pain: Secondary | ICD-10-CM | POA: Diagnosis not present

## 2017-11-20 DIAGNOSIS — E782 Mixed hyperlipidemia: Secondary | ICD-10-CM | POA: Diagnosis not present

## 2017-11-20 DIAGNOSIS — Z1389 Encounter for screening for other disorder: Secondary | ICD-10-CM | POA: Diagnosis not present

## 2017-11-20 DIAGNOSIS — I10 Essential (primary) hypertension: Secondary | ICD-10-CM | POA: Diagnosis not present

## 2017-11-20 DIAGNOSIS — E11610 Type 2 diabetes mellitus with diabetic neuropathic arthropathy: Secondary | ICD-10-CM | POA: Diagnosis not present

## 2017-11-20 DIAGNOSIS — Z0000 Encounter for general adult medical examination without abnormal findings: Secondary | ICD-10-CM | POA: Diagnosis not present

## 2017-11-24 DIAGNOSIS — M810 Age-related osteoporosis without current pathological fracture: Secondary | ICD-10-CM | POA: Diagnosis not present

## 2017-11-24 DIAGNOSIS — M85852 Other specified disorders of bone density and structure, left thigh: Secondary | ICD-10-CM | POA: Diagnosis not present

## 2017-12-06 DIAGNOSIS — M545 Low back pain: Secondary | ICD-10-CM | POA: Diagnosis not present

## 2017-12-06 DIAGNOSIS — E782 Mixed hyperlipidemia: Secondary | ICD-10-CM | POA: Diagnosis not present

## 2017-12-06 DIAGNOSIS — E11610 Type 2 diabetes mellitus with diabetic neuropathic arthropathy: Secondary | ICD-10-CM | POA: Diagnosis not present

## 2017-12-06 DIAGNOSIS — I10 Essential (primary) hypertension: Secondary | ICD-10-CM | POA: Diagnosis not present

## 2017-12-13 DIAGNOSIS — S21001A Unspecified open wound of right breast, initial encounter: Secondary | ICD-10-CM | POA: Diagnosis not present

## 2017-12-14 ENCOUNTER — Telehealth: Payer: Self-pay

## 2017-12-14 NOTE — Telephone Encounter (Signed)
Patient called to inform that she will be seeing a different physician to manage her wound care on breast.  Patient will be seeing [facility] in [place] and will be evaluated for a wound vac.  FYI for Dr. Lindi Adie per patient.

## 2017-12-18 ENCOUNTER — Encounter: Payer: Self-pay | Admitting: Licensed Clinical Social Worker

## 2017-12-18 NOTE — Progress Notes (Signed)
UPDATE: MSH3 c.2041C>T (p.Pro681Ser) VUS was reclassified as "Likely Benign." The report date is 12/12/2017.

## 2017-12-22 DIAGNOSIS — Z853 Personal history of malignant neoplasm of breast: Secondary | ICD-10-CM | POA: Diagnosis not present

## 2017-12-22 DIAGNOSIS — N6011 Diffuse cystic mastopathy of right breast: Secondary | ICD-10-CM | POA: Diagnosis not present

## 2017-12-22 DIAGNOSIS — Z90710 Acquired absence of both cervix and uterus: Secondary | ICD-10-CM | POA: Diagnosis not present

## 2017-12-22 DIAGNOSIS — E119 Type 2 diabetes mellitus without complications: Secondary | ICD-10-CM | POA: Diagnosis not present

## 2017-12-22 DIAGNOSIS — F419 Anxiety disorder, unspecified: Secondary | ICD-10-CM | POA: Diagnosis not present

## 2017-12-22 DIAGNOSIS — Z7984 Long term (current) use of oral hypoglycemic drugs: Secondary | ICD-10-CM | POA: Diagnosis not present

## 2017-12-22 DIAGNOSIS — L7682 Other postprocedural complications of skin and subcutaneous tissue: Secondary | ICD-10-CM | POA: Diagnosis not present

## 2017-12-22 DIAGNOSIS — Z886 Allergy status to analgesic agent status: Secondary | ICD-10-CM | POA: Diagnosis not present

## 2017-12-22 DIAGNOSIS — F17200 Nicotine dependence, unspecified, uncomplicated: Secondary | ICD-10-CM | POA: Diagnosis not present

## 2017-12-22 DIAGNOSIS — Z9049 Acquired absence of other specified parts of digestive tract: Secondary | ICD-10-CM | POA: Diagnosis not present

## 2017-12-22 DIAGNOSIS — E785 Hyperlipidemia, unspecified: Secondary | ICD-10-CM | POA: Diagnosis not present

## 2017-12-22 DIAGNOSIS — Z881 Allergy status to other antibiotic agents status: Secondary | ICD-10-CM | POA: Diagnosis not present

## 2017-12-22 DIAGNOSIS — I10 Essential (primary) hypertension: Secondary | ICD-10-CM | POA: Diagnosis not present

## 2017-12-22 DIAGNOSIS — Z79899 Other long term (current) drug therapy: Secondary | ICD-10-CM | POA: Diagnosis not present

## 2017-12-22 DIAGNOSIS — Z880 Allergy status to penicillin: Secondary | ICD-10-CM | POA: Diagnosis not present

## 2017-12-22 DIAGNOSIS — T8189XA Other complications of procedures, not elsewhere classified, initial encounter: Secondary | ICD-10-CM | POA: Diagnosis not present

## 2017-12-22 DIAGNOSIS — S21001A Unspecified open wound of right breast, initial encounter: Secondary | ICD-10-CM | POA: Diagnosis not present

## 2017-12-25 DIAGNOSIS — F419 Anxiety disorder, unspecified: Secondary | ICD-10-CM | POA: Diagnosis not present

## 2017-12-25 DIAGNOSIS — T8141XD Infection following a procedure, superficial incisional surgical site, subsequent encounter: Secondary | ICD-10-CM | POA: Diagnosis not present

## 2017-12-25 DIAGNOSIS — M549 Dorsalgia, unspecified: Secondary | ICD-10-CM | POA: Diagnosis not present

## 2017-12-25 DIAGNOSIS — N611 Abscess of the breast and nipple: Secondary | ICD-10-CM | POA: Diagnosis not present

## 2017-12-25 DIAGNOSIS — I10 Essential (primary) hypertension: Secondary | ICD-10-CM | POA: Diagnosis not present

## 2017-12-25 DIAGNOSIS — E119 Type 2 diabetes mellitus without complications: Secondary | ICD-10-CM | POA: Diagnosis not present

## 2017-12-25 DIAGNOSIS — Z853 Personal history of malignant neoplasm of breast: Secondary | ICD-10-CM | POA: Diagnosis not present

## 2017-12-25 DIAGNOSIS — Z7984 Long term (current) use of oral hypoglycemic drugs: Secondary | ICD-10-CM | POA: Diagnosis not present

## 2017-12-26 DIAGNOSIS — T8189XA Other complications of procedures, not elsewhere classified, initial encounter: Secondary | ICD-10-CM | POA: Diagnosis not present

## 2018-01-03 DIAGNOSIS — E11610 Type 2 diabetes mellitus with diabetic neuropathic arthropathy: Secondary | ICD-10-CM | POA: Diagnosis not present

## 2018-01-03 DIAGNOSIS — I10 Essential (primary) hypertension: Secondary | ICD-10-CM | POA: Diagnosis not present

## 2018-01-03 DIAGNOSIS — E782 Mixed hyperlipidemia: Secondary | ICD-10-CM | POA: Diagnosis not present

## 2018-01-03 DIAGNOSIS — M545 Low back pain: Secondary | ICD-10-CM | POA: Diagnosis not present

## 2018-01-06 DIAGNOSIS — G8929 Other chronic pain: Secondary | ICD-10-CM | POA: Diagnosis not present

## 2018-01-06 DIAGNOSIS — K861 Other chronic pancreatitis: Secondary | ICD-10-CM | POA: Diagnosis not present

## 2018-01-06 DIAGNOSIS — E785 Hyperlipidemia, unspecified: Secondary | ICD-10-CM | POA: Diagnosis not present

## 2018-01-06 DIAGNOSIS — S21001A Unspecified open wound of right breast, initial encounter: Secondary | ICD-10-CM | POA: Diagnosis not present

## 2018-01-06 DIAGNOSIS — F17200 Nicotine dependence, unspecified, uncomplicated: Secondary | ICD-10-CM | POA: Diagnosis not present

## 2018-01-06 DIAGNOSIS — E119 Type 2 diabetes mellitus without complications: Secondary | ICD-10-CM | POA: Diagnosis not present

## 2018-01-06 DIAGNOSIS — M545 Low back pain: Secondary | ICD-10-CM | POA: Diagnosis not present

## 2018-01-06 DIAGNOSIS — Z7984 Long term (current) use of oral hypoglycemic drugs: Secondary | ICD-10-CM | POA: Diagnosis not present

## 2018-01-06 DIAGNOSIS — Z79899 Other long term (current) drug therapy: Secondary | ICD-10-CM | POA: Diagnosis not present

## 2018-01-06 DIAGNOSIS — N61 Mastitis without abscess: Secondary | ICD-10-CM | POA: Diagnosis not present

## 2018-01-06 DIAGNOSIS — R6883 Chills (without fever): Secondary | ICD-10-CM | POA: Diagnosis not present

## 2018-01-06 DIAGNOSIS — L03313 Cellulitis of chest wall: Secondary | ICD-10-CM | POA: Diagnosis not present

## 2018-01-06 DIAGNOSIS — Z853 Personal history of malignant neoplasm of breast: Secondary | ICD-10-CM | POA: Diagnosis not present

## 2018-01-06 DIAGNOSIS — Z7983 Long term (current) use of bisphosphonates: Secondary | ICD-10-CM | POA: Diagnosis not present

## 2018-01-06 DIAGNOSIS — X58XXXA Exposure to other specified factors, initial encounter: Secondary | ICD-10-CM | POA: Diagnosis not present

## 2018-01-06 DIAGNOSIS — I10 Essential (primary) hypertension: Secondary | ICD-10-CM | POA: Diagnosis not present

## 2018-01-06 DIAGNOSIS — R531 Weakness: Secondary | ICD-10-CM | POA: Diagnosis not present

## 2018-01-06 DIAGNOSIS — S2190XA Unspecified open wound of unspecified part of thorax, initial encounter: Secondary | ICD-10-CM | POA: Diagnosis not present

## 2018-01-12 ENCOUNTER — Other Ambulatory Visit: Payer: Self-pay

## 2018-01-12 NOTE — Patient Outreach (Signed)
[facility]) Care Management  01/12/2018  Tabitha [name] [birth year] [id number]  Transition of care  Referral date: 01/12/18 Referral source: discharged from an inpatient admission from [facility] on 01/10/18 Insurance: health team advantage Attempt #1  Telephone call to patient regarding referral. Unable to reach patient. HIPAA compliant voice message left with call back phone number.   PLAN: RNCM will attempt 2nd telephone call to patient within 4 business days. RNCM will send outreach letter.   [name] RN,BSN, [place] Telephonic  [phone]

## 2018-01-15 ENCOUNTER — Other Ambulatory Visit: Payer: Self-pay

## 2018-01-15 DIAGNOSIS — F329 Major depressive disorder, single episode, unspecified: Secondary | ICD-10-CM | POA: Diagnosis not present

## 2018-01-15 DIAGNOSIS — T8141XD Infection following a procedure, superficial incisional surgical site, subsequent encounter: Secondary | ICD-10-CM | POA: Diagnosis not present

## 2018-01-15 DIAGNOSIS — Z853 Personal history of malignant neoplasm of breast: Secondary | ICD-10-CM | POA: Diagnosis not present

## 2018-01-15 DIAGNOSIS — N611 Abscess of the breast and nipple: Secondary | ICD-10-CM | POA: Diagnosis not present

## 2018-01-15 DIAGNOSIS — F17210 Nicotine dependence, cigarettes, uncomplicated: Secondary | ICD-10-CM | POA: Diagnosis not present

## 2018-01-15 DIAGNOSIS — Z7984 Long term (current) use of oral hypoglycemic drugs: Secondary | ICD-10-CM | POA: Diagnosis not present

## 2018-01-15 DIAGNOSIS — E119 Type 2 diabetes mellitus without complications: Secondary | ICD-10-CM | POA: Diagnosis not present

## 2018-01-15 DIAGNOSIS — N61 Mastitis without abscess: Secondary | ICD-10-CM | POA: Diagnosis not present

## 2018-01-15 NOTE — Patient Outreach (Signed)
[name]) Care Management  01/15/2018  Tabitha [name] 08-24-1955 [id number]   Transition of care  Referral date: 01/12/18 Referral source: discharged from an inpatient admission from [facility] on 01/10/18 Insurance: health team advantage Attempt #2  Telephone call to patient regarding transition of care referral. Unable to reach. HIPAA compliant voice message left with call back phone number.   PLAN; RNCM will attempt 3rd telephone call to patient within 4 business days.   [name] RN,BSN,CCM [facility] Telephonic  [phone]

## 2018-01-17 DIAGNOSIS — Z6829 Body mass index (BMI) 29.0-29.9, adult: Secondary | ICD-10-CM | POA: Diagnosis not present

## 2018-01-17 DIAGNOSIS — L0390 Cellulitis, unspecified: Secondary | ICD-10-CM | POA: Diagnosis not present

## 2018-01-18 ENCOUNTER — Other Ambulatory Visit: Payer: Self-pay

## 2018-01-18 NOTE — Patient Outreach (Signed)
[place] [facility]) Care Management  01/18/2018  Tabitha [name] 1955/03/17 [id number]  Transition of care  Referral date:01/12/18 Referral source:discharged from an inpatient admission from [facility] on 01/10/18 Insurance:health team advantage  Telephone call to patient regarding transition of care follow up. HIPAA verified with patient. Explained reason for the call.  Patient states she was in the hospital due to cellulitis of her right breast. Patient reports having a follow up appointment with her primary MD on yesterday. She states she spoke with her surgeon's office and they will contact her back to reschedule her follow up appointment.  Patient states she has transportation to her  appointments.  Patient states she continues on her antibiotic medication.  She states an [facility] nurse is seeing her 2 times per week.  Patient denies having any new symptoms or concerns. RNCM discussed and offered ongoing transition of care follow up with [facility] care management. Patient declined.  RNCM advised patient to notify MD of any changes in condition prior to scheduled appointment. RNCM reviewed signs of infection with patient. Advised to call doctor as soon as possible for these symptoms. Patient verbalized understanding.  RNCM provided contact name and number: [phone] or main office number [phone] and 24 hour nurse advise line [phone].  RNCM verified patient aware of 911 services for urgent/ emergent needs.  PLAN; RNCM will close patient due to refusal of services.  RNCM will send [facility] care management brochure / magnet to patient RNCm will send closure letter to patients primary MD.   [name] RN,BSN,CCM [facility] Telephonic  [phone]

## 2018-02-06 DIAGNOSIS — Z7984 Long term (current) use of oral hypoglycemic drugs: Secondary | ICD-10-CM | POA: Diagnosis not present

## 2018-02-06 DIAGNOSIS — Z853 Personal history of malignant neoplasm of breast: Secondary | ICD-10-CM | POA: Diagnosis not present

## 2018-02-06 DIAGNOSIS — N611 Abscess of the breast and nipple: Secondary | ICD-10-CM | POA: Diagnosis not present

## 2018-02-06 DIAGNOSIS — E119 Type 2 diabetes mellitus without complications: Secondary | ICD-10-CM | POA: Diagnosis not present

## 2018-02-06 DIAGNOSIS — F419 Anxiety disorder, unspecified: Secondary | ICD-10-CM | POA: Diagnosis not present

## 2018-02-06 DIAGNOSIS — T8141XD Infection following a procedure, superficial incisional surgical site, subsequent encounter: Secondary | ICD-10-CM | POA: Diagnosis not present

## 2018-02-06 DIAGNOSIS — M549 Dorsalgia, unspecified: Secondary | ICD-10-CM | POA: Diagnosis not present

## 2018-02-06 DIAGNOSIS — I10 Essential (primary) hypertension: Secondary | ICD-10-CM | POA: Diagnosis not present

## 2018-02-12 DIAGNOSIS — S21001A Unspecified open wound of right breast, initial encounter: Secondary | ICD-10-CM | POA: Diagnosis not present

## 2018-02-15 DIAGNOSIS — Z853 Personal history of malignant neoplasm of breast: Secondary | ICD-10-CM | POA: Diagnosis not present

## 2018-02-15 DIAGNOSIS — T8141XD Infection following a procedure, superficial incisional surgical site, subsequent encounter: Secondary | ICD-10-CM | POA: Diagnosis not present

## 2018-02-15 DIAGNOSIS — M4800 Spinal stenosis, site unspecified: Secondary | ICD-10-CM | POA: Diagnosis not present

## 2018-02-15 DIAGNOSIS — T8189XA Other complications of procedures, not elsewhere classified, initial encounter: Secondary | ICD-10-CM | POA: Diagnosis not present

## 2018-02-15 DIAGNOSIS — N61 Mastitis without abscess: Secondary | ICD-10-CM | POA: Diagnosis not present

## 2018-02-15 DIAGNOSIS — E119 Type 2 diabetes mellitus without complications: Secondary | ICD-10-CM | POA: Diagnosis not present

## 2018-02-15 DIAGNOSIS — Q762 Congenital spondylolisthesis: Secondary | ICD-10-CM | POA: Diagnosis not present

## 2018-02-15 DIAGNOSIS — F17210 Nicotine dependence, cigarettes, uncomplicated: Secondary | ICD-10-CM | POA: Diagnosis not present

## 2018-02-15 DIAGNOSIS — Z7984 Long term (current) use of oral hypoglycemic drugs: Secondary | ICD-10-CM | POA: Diagnosis not present

## 2018-02-15 DIAGNOSIS — F329 Major depressive disorder, single episode, unspecified: Secondary | ICD-10-CM | POA: Diagnosis not present

## 2018-03-07 DIAGNOSIS — S21001D Unspecified open wound of right breast, subsequent encounter: Secondary | ICD-10-CM | POA: Diagnosis not present

## 2018-03-07 DIAGNOSIS — C50511 Malignant neoplasm of lower-outer quadrant of right female breast: Secondary | ICD-10-CM | POA: Diagnosis not present

## 2018-03-07 DIAGNOSIS — Z170 Estrogen receptor positive status [ER+]: Secondary | ICD-10-CM | POA: Diagnosis not present

## 2018-03-20 ENCOUNTER — Telehealth: Payer: Self-pay | Admitting: Hematology and Oncology

## 2018-03-20 NOTE — Telephone Encounter (Signed)
Called patient per 2/10 sch message - unable to reach patient - left message for patient to call back to r/s if still needed.

## 2018-04-05 ENCOUNTER — Telehealth: Payer: Self-pay | Admitting: Hematology and Oncology

## 2018-04-05 NOTE — Telephone Encounter (Signed)
Scheduled appt per 2/26 sch message - left message for patient with appt date and time

## 2018-04-09 ENCOUNTER — Ambulatory Visit: Payer: PPO | Admitting: Hematology and Oncology

## 2018-04-09 DIAGNOSIS — S21001D Unspecified open wound of right breast, subsequent encounter: Secondary | ICD-10-CM | POA: Diagnosis not present

## 2018-04-09 DIAGNOSIS — C50511 Malignant neoplasm of lower-outer quadrant of right female breast: Secondary | ICD-10-CM | POA: Diagnosis not present

## 2018-04-09 DIAGNOSIS — Z170 Estrogen receptor positive status [ER+]: Secondary | ICD-10-CM | POA: Diagnosis not present

## 2018-04-10 DIAGNOSIS — Z0000 Encounter for general adult medical examination without abnormal findings: Secondary | ICD-10-CM | POA: Diagnosis not present

## 2018-04-10 DIAGNOSIS — I10 Essential (primary) hypertension: Secondary | ICD-10-CM | POA: Diagnosis not present

## 2018-04-10 DIAGNOSIS — Z6829 Body mass index (BMI) 29.0-29.9, adult: Secondary | ICD-10-CM | POA: Diagnosis not present

## 2018-04-10 DIAGNOSIS — F419 Anxiety disorder, unspecified: Secondary | ICD-10-CM | POA: Diagnosis not present

## 2018-04-10 DIAGNOSIS — E1143 Type 2 diabetes mellitus with diabetic autonomic (poly)neuropathy: Secondary | ICD-10-CM | POA: Diagnosis not present

## 2018-04-13 ENCOUNTER — Inpatient Hospital Stay: Payer: PPO | Admitting: Hematology and Oncology

## 2018-04-16 ENCOUNTER — Telehealth: Payer: Self-pay | Admitting: Hematology and Oncology

## 2018-04-16 ENCOUNTER — Inpatient Hospital Stay: Payer: PPO | Attending: Hematology and Oncology | Admitting: Hematology and Oncology

## 2018-04-16 ENCOUNTER — Encounter (HOSPITAL_COMMUNITY): Payer: PPO

## 2018-04-16 ENCOUNTER — Other Ambulatory Visit: Payer: Self-pay

## 2018-04-16 ENCOUNTER — Telehealth: Payer: Self-pay

## 2018-04-16 DIAGNOSIS — Z79811 Long term (current) use of aromatase inhibitors: Secondary | ICD-10-CM

## 2018-04-16 DIAGNOSIS — Z170 Estrogen receptor positive status [ER+]: Secondary | ICD-10-CM | POA: Diagnosis not present

## 2018-04-16 DIAGNOSIS — C50511 Malignant neoplasm of lower-outer quadrant of right female breast: Secondary | ICD-10-CM

## 2018-04-16 DIAGNOSIS — M7989 Other specified soft tissue disorders: Secondary | ICD-10-CM

## 2018-04-16 MED ORDER — SULFAMETHOXAZOLE-TRIMETHOPRIM 800-160 MG PO TABS: 1.0000 | ORAL_TABLET | Freq: Two times a day (BID) | ORAL | 0 refills | Status: DC

## 2018-04-16 MED ORDER — ANASTROZOLE 1 MG PO TABS: 1.0000 mg | ORAL_TABLET | Freq: Every day | ORAL | 3 refills | Status: DC

## 2018-04-16 NOTE — Telephone Encounter (Signed)
Verbal orders given for venous doppler of right leg to rule out DVT d/t right leg swelling.  Nurse placed called to [facility]/S - scheduled for 11am on 3/9.  Nurse informed patient, pt declined due to time restrictions.  Pt request if she could have this done at hospital in [place].  Nurse called [facility] in [place], patient scheduled.  Pt aware of appointment time and dates, order will be faxed.  No further needs at this time.

## 2018-04-16 NOTE — Telephone Encounter (Signed)
Gave avs and calendar ° °

## 2018-04-16 NOTE — Progress Notes (Signed)
Vas

## 2018-04-16 NOTE — Progress Notes (Signed)
Patient Care Team: Neale Burly, MD as PCP - General (Internal Medicine) Excell Seltzer, MD as Consulting Physician (General Surgery) Nicholas Lose, MD as Consulting Physician (Hematology and Oncology) Eppie Gibson, MD as Attending Physician (Radiation Oncology) Gardenia Phlegm, NP as Nurse Practitioner (Hematology and Oncology) Audry Pili, MD as Referring Physician (Radiation Oncology)  DIAGNOSIS:  Encounter Diagnosis  Name Primary?  . Malignant neoplasm of lower-outer quadrant of right breast of female, estrogen receptor positive ([place])     SUMMARY OF ONCOLOGIC HISTORY:   Malignant neoplasm of lower-outer quadrant of right breast of female, estrogen receptor positive ([place])   06/08/2016 Initial Diagnosis    Right breast asymmetry posteriorly 7 mm size, no ultrasound correlate, AFFIRM biopsy: Grade 2 invasive ductal carcinoma ER 100%, PR 95%, Ki-67 10%, HER-2 negative ratio 1.58, T1 BN 0 stage IA clinical stage    06/15/2016 Genetic Testing    Genetic testing did not reveal a deleterious mutation.  Variants of uncertain significance (VUSs) called DICER1 c.1468C>T (p.Arg490Cys) and MSH3 c.2041C>T (p.Pro681Ser) were also noted.  Genes tested include: APC, ATM, AXIN2, BARD1, BMPR1A, BRCA1, BRCA2, BRIP1, CDH1, CDKN2A, CHEK2, CTNNA1, DICER1, EPCAM, GREM1, HOXB13, KIT, MEN1, MLH1, MSH2, MSH3, MSH6, MUTYH, NBN, NF1, NTHL1, PALB2, PDGFRA, PMS2, POLD1, POLE, PTEN, RAD50, RAD51C, RAD51D, SDHA, SDHB, SDHC, SDHD, SMAD4, SMARCA4, STK11, TP53, TSC1, TSC2, and VHL.  UPDATE: MSH3 c.2041C>T (p.Pro681Ser) VUS was reclassified as "Likely Benign." The report date is 12/12/2017.     07/06/2016 Surgery    Right lumpectomy: No tumor noted on the resected specimen    09/14/2016 Surgery    Right lumpectomy: Small residual focus of IDC grade 1, 0.2 cm, margins negative, ER 100%, PR 95%, HER-2 negative, Ki-67 10%, T1a N0 stage IA    10/27/2016 - 11/15/2016 Radiation Therapy    Adjuvant  radiation with Dr. Sherryll Burger breast: 42.4 Gy in 16 treatments, Right breast boost: 10 Gy in 4 treatments    11/2016 -  Anti-estrogen oral therapy    Anastrozole daily    05/26/2017 Surgery    Breast abscess: Surgery performed     CHIEF COMPLIANT: Complains of right leg swelling with redness, palpable nodularity in the right breast  INTERVAL HISTORY: Tabitha [name] is a 63 year old with above-mentioned is a right breast cancer treated with lumpectomy and adjuvant radiation.  She had a nonhealing wound for several years and it healed but she felt the nodularity and went to see a different surgeon who performed another surgery and that took several months of wound VAC and it finally healed.  She was getting examined by her family who noted small nodularity in the right breast somewhat to come and be seen today.  She is also complaining of right leg swelling with a small area of erythema.  This is been going on for the past several weeks.  REVIEW OF SYSTEMS:   Constitutional: Denies fevers, chills or abnormal weight loss Eyes: Denies blurriness of vision Ears, nose, mouth, throat, and face: Denies mucositis or sore throat Respiratory: Denies cough, dyspnea or wheezes Cardiovascular: Denies palpitation, chest discomfort Gastrointestinal:  Denies nausea, heartburn or change in bowel habits Skin: Denies abnormal skin rashes Lymphatics: Denies new lymphadenopathy or easy bruising Neurological:Denies numbness, tingling or new weaknesses Behavioral/Psych: Mood is stable, no new changes  Extremities: Right leg edema with erythema Breast: Right breast nodularity All other systems were reviewed with the patient and are negative.  I have reviewed the past medical history, past surgical history, social history and family  history with the patient and they are unchanged from previous note.  ALLERGIES:  is allergic to ampicillin; barium-containing compounds; penicillins; and morphine and  related.  MEDICATIONS:  Current Outpatient Medications  Medication Sig Dispense Refill  . acetaminophen (TYLENOL) 325 MG tablet Take 325 mg by mouth daily as needed for moderate pain.     Marland Kitchen alendronate (FOSAMAX) 70 MG tablet Take 70 mg by mouth every Monday. Take with a full glass of water on an empty stomach.     . ALPRAZolam (XANAX) 0.5 MG tablet Take 0.5 mg by mouth at bedtime as needed for anxiety or sleep.     Marland Kitchen amLODipine (NORVASC) 5 MG tablet Take 5 mg by mouth daily.    Marland Kitchen anastrozole (ARIMIDEX) 1 MG tablet Take 1 tablet (1 mg total) by mouth daily. 90 tablet 3  . atorvastatin (LIPITOR) 20 MG tablet Take 20 mg by mouth every evening.     . hydrOXYzine (VISTARIL) 25 MG capsule Take 25 mg by mouth 3 (three) times daily as needed for itching.    Marland Kitchen lisinopril (PRINIVIL,ZESTRIL) 40 MG tablet Take 40 mg by mouth daily.    . metFORMIN (GLUCOPHAGE) 500 MG tablet Take 500 mg by mouth 2 (two) times daily.     . Oxycodone HCl 10 MG TABS Take 10 mg by mouth 3 (three) times daily.     . Pancrelipase, Lip-Prot-Amyl, (CREON) 24000-76000 units CPEP Take 2 capsules by mouth 3 (three) times daily before meals.     Marland Kitchen sulfamethoxazole-trimethoprim (BACTRIM DS,SEPTRA DS) 800-160 MG tablet Take 1 tablet by mouth 2 (two) times daily. 14 tablet 0  . triamterene-hydrochlorothiazide (MAXZIDE) 75-50 MG tablet Take 1 tablet by mouth daily.     No current facility-administered medications for this visit.     PHYSICAL EXAMINATION: ECOG PERFORMANCE STATUS: 1 - Symptomatic but completely ambulatory  Vitals:   04/16/18 0921  BP: 138/83  Pulse: 70  Resp: 18  Temp: 97.6 F (36.4 C)  SpO2: 100%   Filed Weights   04/16/18 0921  Weight: 170 lb 14.4 oz (77.5 kg)    GENERAL:alert, no distress and comfortable SKIN: skin color, texture, turgor are normal, no rashes or significant lesions EYES: normal, Conjunctiva are pink and non-injected, sclera clear OROPHARYNX:no exudate, no erythema and lips, buccal  mucosa, and tongue normal  NECK: supple, thyroid normal size, non-tender, without nodularity LYMPH:  no palpable lymphadenopathy in the cervical, axillary or inguinal LUNGS: clear to auscultation and percussion with normal breathing effort HEART: regular rate & rhythm and no murmurs and no lower extremity edema ABDOMEN:abdomen soft, non-tender and normal bowel sounds MUSCULOSKELETAL:no cyanosis of digits and no clubbing  NEURO: alert & oriented x 3 with fluent speech, no focal motor/sensory deficits EXTREMITIES: No lower extremity edema BREAST: Scars from prior surgery were noted.. (exam performed in the presence of a chaperone)    LABORATORY DATA:  I have reviewed the data as listed CMP Latest Ref Rng & Units 06/04/2017 06/02/2017 09/05/2016  Glucose 65 - 99 mg/dL 143(H) 130(H) 130(H)  BUN 6 - 20 mg/dL _0 Creatinine 0.44 - 1.00 mg/dL 1.01(H) 0.75 0.73  Sodium 135 - 145 mmol/L 136 138 138  Potassium 3.5 - 5.1 mmol/L 4.3 4.2 4.2  Chloride 101 - 111 mmol/L 105 103 106  CO2 22 - 32 mmol/L _1 Calcium 8.9 - 10.3 mg/dL 9.4 9.2 9.4  Total Protein 6.5 - 8.1 g/dL - 7.1 -  Total Bilirubin 0.3 - 1.2 mg/dL -  0.7 -  Alkaline Phos 38 - 126 U/L - 69 -  AST 15 - 41 U/L - 19 -  ALT 14 - 54 U/L - 18 -    Lab Results  Component Value Date   WBC 11.5 (H) 06/04/2017   HGB 12.7 06/04/2017   HCT 38.5 06/04/2017   MCV 87.9 06/04/2017   PLT 244 06/04/2017   NEUTROABS 5.9 06/15/2016    ASSESSMENT & PLAN:  Malignant neoplasm of lower-outer quadrant of right breast of female, estrogen receptor positive ([place]) 08/08/2018Right lumpectomy: Small residual focus of IDC grade 1, 0.2 cm, margins negative, ER 100%, PR 95%, HER-2 negative, Ki-67 10%, T1a N0 stage IA (07/05/2016: Breast conserving surgery: No tumor detected on the pathological evaluation.) Adjuvant radiation therapy done in [place] Longstanding nonhealing wound right breast  Current treatment: Adjuvant antiestrogen therapy with  anastrozole 1 mg daily started October 2018  Anastrozole toxicities: Occasional hot flashes denies any myalgias  Breast cancer surveillance: 1.  Breast exam  breast scars do not appear to show any evidence of recurrence of malignancy.  She will be due for her mammograms in a month.  I think it is reasonable to do the mammogram at that time. 2. Mammogram 09/05/2017: Benign Right breast mammogram and ultrasound: 11/09/2017 stable postlumpectomy changes right breast  Right leg swelling and cellulitis: I gave her prescription for Bactrim and obtained ultrasound of the leg.  We are awaiting the results of the ultrasound.  Return to clinic in 1 year for follow-up    No orders of the defined types were placed in this encounter.  The patient has a good understanding of the overall plan. she agrees with it. she will call with any problems that may develop before the next visit here.   Harriette Ohara, MD 04/16/18

## 2018-04-16 NOTE — Assessment & Plan Note (Signed)
08/08/2018Right lumpectomy: Small residual focus of IDC grade 1, 0.2 cm, margins negative, ER 100%, PR 95%, HER-2 negative, Ki-67 10%, T1a N0 stage IA (07/05/2016: Breast conserving surgery: No tumor detected on the pathological evaluation.) Adjuvant radiation therapy done in [place] Longstanding nonhealing wound right breast  Current treatment: Adjuvant antiestrogen therapy with anastrozole 1 mg daily started October 2018  Anastrozole toxicities: Occasional hot flashes denies any myalgias  Breast cancer surveillance: 1.  Breast exam nonhealing breast wound 2. Mammogram 09/05/2017: Benign Right breast mammogram and ultrasound: 11/09/2017 stable postlumpectomy changes right breast  Return to clinic in 1 year for follow-up

## 2018-04-17 DIAGNOSIS — R2241 Localized swelling, mass and lump, right lower limb: Secondary | ICD-10-CM | POA: Diagnosis not present

## 2018-04-17 DIAGNOSIS — M7989 Other specified soft tissue disorders: Secondary | ICD-10-CM | POA: Diagnosis not present

## 2018-04-17 DIAGNOSIS — R600 Localized edema: Secondary | ICD-10-CM | POA: Diagnosis not present

## 2018-04-18 ENCOUNTER — Telehealth: Payer: Self-pay

## 2018-04-18 NOTE — Telephone Encounter (Signed)
Called pt to let her know that her RLE doppler was negative for dvt, per Dr.Gudena review. Pt on antibiotic for cellulitis. Pt to call if symptoms worsen. Pt verbalized understanding and thankful for the call.

## 2018-04-19 ENCOUNTER — Ambulatory Visit: Payer: PPO | Admitting: Hematology and Oncology

## 2018-06-11 DIAGNOSIS — Z6829 Body mass index (BMI) 29.0-29.9, adult: Secondary | ICD-10-CM | POA: Diagnosis not present

## 2018-06-11 DIAGNOSIS — I10 Essential (primary) hypertension: Secondary | ICD-10-CM | POA: Diagnosis not present

## 2018-06-11 DIAGNOSIS — F419 Anxiety disorder, unspecified: Secondary | ICD-10-CM | POA: Diagnosis not present

## 2018-06-11 DIAGNOSIS — E1143 Type 2 diabetes mellitus with diabetic autonomic (poly)neuropathy: Secondary | ICD-10-CM | POA: Diagnosis not present

## 2018-07-09 DIAGNOSIS — L959 Vasculitis limited to the skin, unspecified: Secondary | ICD-10-CM | POA: Diagnosis not present

## 2018-07-09 DIAGNOSIS — Z6829 Body mass index (BMI) 29.0-29.9, adult: Secondary | ICD-10-CM | POA: Diagnosis not present

## 2018-07-31 DIAGNOSIS — H40053 Ocular hypertension, bilateral: Secondary | ICD-10-CM | POA: Diagnosis not present

## 2018-07-31 DIAGNOSIS — E119 Type 2 diabetes mellitus without complications: Secondary | ICD-10-CM | POA: Diagnosis not present

## 2018-07-31 DIAGNOSIS — H353191 Nonexudative age-related macular degeneration, unspecified eye, early dry stage: Secondary | ICD-10-CM | POA: Diagnosis not present

## 2018-07-31 DIAGNOSIS — H2513 Age-related nuclear cataract, bilateral: Secondary | ICD-10-CM | POA: Diagnosis not present

## 2018-08-08 DIAGNOSIS — Z6829 Body mass index (BMI) 29.0-29.9, adult: Secondary | ICD-10-CM | POA: Diagnosis not present

## 2018-08-08 DIAGNOSIS — M545 Low back pain: Secondary | ICD-10-CM | POA: Diagnosis not present

## 2018-08-09 DIAGNOSIS — H25813 Combined forms of age-related cataract, bilateral: Secondary | ICD-10-CM | POA: Diagnosis not present

## 2018-08-09 DIAGNOSIS — E119 Type 2 diabetes mellitus without complications: Secondary | ICD-10-CM | POA: Diagnosis not present

## 2018-08-09 DIAGNOSIS — H35363 Drusen (degenerative) of macula, bilateral: Secondary | ICD-10-CM | POA: Diagnosis not present

## 2018-08-09 DIAGNOSIS — H53413 Scotoma involving central area, bilateral: Secondary | ICD-10-CM | POA: Diagnosis not present

## 2018-08-09 DIAGNOSIS — H35342 Macular cyst, hole, or pseudohole, left eye: Secondary | ICD-10-CM | POA: Diagnosis not present

## 2018-08-09 DIAGNOSIS — H31093 Other chorioretinal scars, bilateral: Secondary | ICD-10-CM | POA: Diagnosis not present

## 2018-09-04 ENCOUNTER — Ambulatory Visit: Payer: PPO | Admitting: Hematology and Oncology

## 2018-09-12 DIAGNOSIS — R928 Other abnormal and inconclusive findings on diagnostic imaging of breast: Secondary | ICD-10-CM | POA: Diagnosis not present

## 2018-09-12 DIAGNOSIS — Z170 Estrogen receptor positive status [ER+]: Secondary | ICD-10-CM | POA: Diagnosis not present

## 2018-09-12 DIAGNOSIS — C50511 Malignant neoplasm of lower-outer quadrant of right female breast: Secondary | ICD-10-CM | POA: Diagnosis not present

## 2018-09-17 DIAGNOSIS — Z170 Estrogen receptor positive status [ER+]: Secondary | ICD-10-CM | POA: Diagnosis not present

## 2018-09-17 DIAGNOSIS — C50511 Malignant neoplasm of lower-outer quadrant of right female breast: Secondary | ICD-10-CM | POA: Diagnosis not present

## 2018-10-09 DIAGNOSIS — E119 Type 2 diabetes mellitus without complications: Secondary | ICD-10-CM | POA: Diagnosis not present

## 2018-10-09 DIAGNOSIS — Z6829 Body mass index (BMI) 29.0-29.9, adult: Secondary | ICD-10-CM | POA: Diagnosis not present

## 2018-10-09 DIAGNOSIS — I10 Essential (primary) hypertension: Secondary | ICD-10-CM | POA: Diagnosis not present

## 2018-10-09 DIAGNOSIS — K210 Gastro-esophageal reflux disease with esophagitis: Secondary | ICD-10-CM | POA: Diagnosis not present

## 2018-10-09 DIAGNOSIS — M545 Low back pain: Secondary | ICD-10-CM | POA: Diagnosis not present

## 2018-11-22 DIAGNOSIS — M545 Low back pain: Secondary | ICD-10-CM | POA: Diagnosis not present

## 2018-11-22 DIAGNOSIS — I10 Essential (primary) hypertension: Secondary | ICD-10-CM | POA: Diagnosis not present

## 2018-11-22 DIAGNOSIS — R1013 Epigastric pain: Secondary | ICD-10-CM | POA: Diagnosis not present

## 2018-11-22 DIAGNOSIS — M79621 Pain in right upper arm: Secondary | ICD-10-CM | POA: Diagnosis not present

## 2018-11-22 DIAGNOSIS — E119 Type 2 diabetes mellitus without complications: Secondary | ICD-10-CM | POA: Diagnosis not present

## 2018-11-22 DIAGNOSIS — N189 Chronic kidney disease, unspecified: Secondary | ICD-10-CM | POA: Diagnosis not present

## 2018-11-22 DIAGNOSIS — K219 Gastro-esophageal reflux disease without esophagitis: Secondary | ICD-10-CM | POA: Diagnosis not present

## 2018-11-22 DIAGNOSIS — Z6829 Body mass index (BMI) 29.0-29.9, adult: Secondary | ICD-10-CM | POA: Diagnosis not present

## 2018-11-23 DIAGNOSIS — R600 Localized edema: Secondary | ICD-10-CM | POA: Diagnosis not present

## 2018-11-23 DIAGNOSIS — M79621 Pain in right upper arm: Secondary | ICD-10-CM | POA: Diagnosis not present

## 2018-12-19 DIAGNOSIS — Z170 Estrogen receptor positive status [ER+]: Secondary | ICD-10-CM | POA: Diagnosis not present

## 2018-12-19 DIAGNOSIS — C50511 Malignant neoplasm of lower-outer quadrant of right female breast: Secondary | ICD-10-CM | POA: Diagnosis not present

## 2019-01-23 DIAGNOSIS — I10 Essential (primary) hypertension: Secondary | ICD-10-CM | POA: Diagnosis not present

## 2019-01-23 DIAGNOSIS — E119 Type 2 diabetes mellitus without complications: Secondary | ICD-10-CM | POA: Diagnosis not present

## 2019-01-23 DIAGNOSIS — Z6829 Body mass index (BMI) 29.0-29.9, adult: Secondary | ICD-10-CM | POA: Diagnosis not present

## 2019-01-23 DIAGNOSIS — K219 Gastro-esophageal reflux disease without esophagitis: Secondary | ICD-10-CM | POA: Diagnosis not present

## 2019-01-23 DIAGNOSIS — M545 Low back pain: Secondary | ICD-10-CM | POA: Diagnosis not present

## 2019-03-08 DIAGNOSIS — E119 Type 2 diabetes mellitus without complications: Secondary | ICD-10-CM | POA: Diagnosis not present

## 2019-03-08 DIAGNOSIS — H35363 Drusen (degenerative) of macula, bilateral: Secondary | ICD-10-CM | POA: Diagnosis not present

## 2019-03-08 DIAGNOSIS — H31093 Other chorioretinal scars, bilateral: Secondary | ICD-10-CM | POA: Diagnosis not present

## 2019-03-08 DIAGNOSIS — H35342 Macular cyst, hole, or pseudohole, left eye: Secondary | ICD-10-CM | POA: Diagnosis not present

## 2019-03-08 DIAGNOSIS — H53413 Scotoma involving central area, bilateral: Secondary | ICD-10-CM | POA: Diagnosis not present

## 2019-03-08 DIAGNOSIS — H25813 Combined forms of age-related cataract, bilateral: Secondary | ICD-10-CM | POA: Diagnosis not present

## 2019-03-26 DIAGNOSIS — R1084 Generalized abdominal pain: Secondary | ICD-10-CM | POA: Diagnosis not present

## 2019-03-26 DIAGNOSIS — Z6830 Body mass index (BMI) 30.0-30.9, adult: Secondary | ICD-10-CM | POA: Diagnosis not present

## 2019-03-27 DIAGNOSIS — M545 Low back pain: Secondary | ICD-10-CM | POA: Diagnosis not present

## 2019-03-27 DIAGNOSIS — F17210 Nicotine dependence, cigarettes, uncomplicated: Secondary | ICD-10-CM | POA: Diagnosis not present

## 2019-03-27 DIAGNOSIS — E119 Type 2 diabetes mellitus without complications: Secondary | ICD-10-CM | POA: Diagnosis not present

## 2019-03-27 DIAGNOSIS — K219 Gastro-esophageal reflux disease without esophagitis: Secondary | ICD-10-CM | POA: Diagnosis not present

## 2019-03-27 DIAGNOSIS — Z23 Encounter for immunization: Secondary | ICD-10-CM | POA: Diagnosis not present

## 2019-03-27 DIAGNOSIS — Z7983 Long term (current) use of bisphosphonates: Secondary | ICD-10-CM | POA: Diagnosis not present

## 2019-03-27 DIAGNOSIS — G8929 Other chronic pain: Secondary | ICD-10-CM | POA: Diagnosis not present

## 2019-03-27 DIAGNOSIS — F17200 Nicotine dependence, unspecified, uncomplicated: Secondary | ICD-10-CM | POA: Diagnosis not present

## 2019-03-27 DIAGNOSIS — Z853 Personal history of malignant neoplasm of breast: Secondary | ICD-10-CM | POA: Diagnosis not present

## 2019-03-27 DIAGNOSIS — I10 Essential (primary) hypertension: Secondary | ICD-10-CM | POA: Diagnosis not present

## 2019-03-27 DIAGNOSIS — Z7984 Long term (current) use of oral hypoglycemic drugs: Secondary | ICD-10-CM | POA: Diagnosis not present

## 2019-03-27 DIAGNOSIS — K859 Acute pancreatitis without necrosis or infection, unspecified: Secondary | ICD-10-CM | POA: Diagnosis not present

## 2019-03-27 DIAGNOSIS — E785 Hyperlipidemia, unspecified: Secondary | ICD-10-CM | POA: Diagnosis not present

## 2019-03-27 DIAGNOSIS — K861 Other chronic pancreatitis: Secondary | ICD-10-CM | POA: Diagnosis not present

## 2019-03-27 DIAGNOSIS — Z20822 Contact with and (suspected) exposure to covid-19: Secondary | ICD-10-CM | POA: Diagnosis not present

## 2019-03-27 DIAGNOSIS — Z880 Allergy status to penicillin: Secondary | ICD-10-CM | POA: Diagnosis not present

## 2019-04-16 ENCOUNTER — Ambulatory Visit: Payer: PPO | Admitting: Hematology and Oncology

## 2019-04-17 NOTE — Progress Notes (Signed)
Patient Care Team: Neale Burly, MD as PCP - General (Internal Medicine) Excell Seltzer, MD (Inactive) as Consulting Physician (General Surgery) Nicholas Lose, MD as Consulting Physician (Hematology and Oncology) Eppie Gibson, MD as Attending Physician (Radiation Oncology) Gardenia Phlegm, NP as Nurse Practitioner (Hematology and Oncology) Audry Pili, MD as Referring Physician (Radiation Oncology)  DIAGNOSIS:    ICD-10-CM   1. Malignant neoplasm of lower-outer quadrant of right breast of female, estrogen receptor positive ([place])  C50.511    Z17.0     SUMMARY OF ONCOLOGIC HISTORY: Oncology History  Malignant neoplasm of lower-outer quadrant of right breast of female, estrogen receptor positive ([place])  06/08/2016 Initial Diagnosis   Right breast asymmetry posteriorly 7 mm size, no ultrasound correlate, AFFIRM biopsy: Grade 2 invasive ductal carcinoma ER 100%, PR 95%, Ki-67 10%, HER-2 negative ratio 1.58, T1 BN 0 stage IA clinical stage   06/15/2016 Genetic Testing   Genetic testing did not reveal a deleterious mutation.  Variants of uncertain significance (VUSs) called DICER1 c.1468C>T (p.Arg490Cys) and MSH3 c.2041C>T (p.Pro681Ser) were also noted.  Genes tested include: APC, ATM, AXIN2, BARD1, BMPR1A, BRCA1, BRCA2, BRIP1, CDH1, CDKN2A, CHEK2, CTNNA1, DICER1, EPCAM, GREM1, HOXB13, KIT, MEN1, MLH1, MSH2, MSH3, MSH6, MUTYH, NBN, NF1, NTHL1, PALB2, PDGFRA, PMS2, POLD1, POLE, PTEN, RAD50, RAD51C, RAD51D, SDHA, SDHB, SDHC, SDHD, SMAD4, SMARCA4, STK11, TP53, TSC1, TSC2, and VHL.  UPDATE: MSH3 c.2041C>T (p.Pro681Ser) VUS was reclassified as "Likely Benign." The report date is 12/12/2017.    07/06/2016 Surgery   Right lumpectomy: No tumor noted on the resected specimen   09/14/2016 Surgery   Right lumpectomy: Small residual focus of IDC grade 1, 0.2 cm, margins negative, ER 100%, PR 95%, HER-2 negative, Ki-67 10%, T1a N0 stage IA   10/27/2016 - 11/15/2016 Radiation Therapy   Adjuvant radiation with Dr. Sherryll Burger breast: 42.4 Gy in 16 treatments, Right breast boost: 10 Gy in 4 treatments   11/2016 -  Anti-estrogen oral therapy   Anastrozole daily   05/26/2017 Surgery   Breast abscess: Surgery performed     CHIEF COMPLIANT: Follow-up pf right breast cancer on anastrozole   INTERVAL HISTORY: Tabitha [name] is a 64 y.o. with above-mentioned history of right breast cancer treated with lumpectomy, adjuvant radiation, and who is currently on antiestrogen therapy with anastrozole. She presents to the clinic today for annual follow-up.   ALLERGIES:  is allergic to ampicillin; barium-containing compounds; penicillins; and morphine and related.  MEDICATIONS:  Current Outpatient Medications  Medication Sig Dispense Refill  . acetaminophen (TYLENOL) 325 MG tablet Take 325 mg by mouth daily as needed for moderate pain.     Marland Kitchen alendronate (FOSAMAX) 70 MG tablet Take 70 mg by mouth every Monday. Take with a full glass of water on an empty stomach.     . ALPRAZolam (XANAX) 0.5 MG tablet Take 0.5 mg by mouth at bedtime as needed for anxiety or sleep.     Marland Kitchen amLODipine (NORVASC) 5 MG tablet Take 5 mg by mouth daily.    Marland Kitchen anastrozole (ARIMIDEX) 1 MG tablet Take 1 tablet (1 mg total) by mouth daily. 90 tablet 3  . atorvastatin (LIPITOR) 20 MG tablet Take 20 mg by mouth every evening.     . hydrOXYzine (VISTARIL) 25 MG capsule Take 25 mg by mouth 3 (three) times daily as needed for itching.    Marland Kitchen lisinopril (PRINIVIL,ZESTRIL) 40 MG tablet Take 40 mg by mouth daily.    . metFORMIN (GLUCOPHAGE) 500 MG tablet Take 500 mg by  mouth 2 (two) times daily.     . Oxycodone HCl 10 MG TABS Take 10 mg by mouth 3 (three) times daily.     . Pancrelipase, Lip-Prot-Amyl, (CREON) 24000-76000 units CPEP Take 2 capsules by mouth 3 (three) times daily before meals.     Marland Kitchen sulfamethoxazole-trimethoprim (BACTRIM DS,SEPTRA DS) 800-160 MG tablet Take 1 tablet by mouth 2 (two) times daily. 14 tablet  0  . triamterene-hydrochlorothiazide (MAXZIDE) 75-50 MG tablet Take 1 tablet by mouth daily.     No current facility-administered medications for this visit.    PHYSICAL EXAMINATION: ECOG PERFORMANCE STATUS: 1 - Symptomatic but completely ambulatory  Vitals:   04/18/19 1038  BP: 131/76  Pulse: 75  Resp: 18  Temp: 98.5 F (36.9 C)  SpO2: 100%   Filed Weights   04/18/19 1038  Weight: 168 lb 9.6 oz (76.5 kg)    BREAST: No palpable masses or nodules in either right or left breasts. No palpable axillary supraclavicular or infraclavicular adenopathy no breast tenderness or nipple discharge. (exam performed in the presence of a chaperone)  LABORATORY DATA:  I have reviewed the data as listed CMP Latest Ref Rng & Units 06/04/2017 06/02/2017 09/05/2016  Glucose 65 - 99 mg/dL 143(H) 130(H) 130(H)  BUN 6 - 20 mg/dL '18 9 8  ' Creatinine 0.44 - 1.00 mg/dL 1.01(H) 0.75 0.73  Sodium 135 - 145 mmol/L 136 138 138  Potassium 3.5 - 5.1 mmol/L 4.3 4.2 4.2  Chloride 101 - 111 mmol/L 105 103 106  CO2 22 - 32 mmol/L '23 24 24  ' Calcium 8.9 - 10.3 mg/dL 9.4 9.2 9.4  Total Protein 6.5 - 8.1 g/dL - 7.1 -  Total Bilirubin 0.3 - 1.2 mg/dL - 0.7 -  Alkaline Phos 38 - 126 U/L - 69 -  AST 15 - 41 U/L - 19 -  ALT 14 - 54 U/L - 18 -    Lab Results  Component Value Date   WBC 11.5 (H) 06/04/2017   HGB 12.7 06/04/2017   HCT 38.5 06/04/2017   MCV 87.9 06/04/2017   PLT 244 06/04/2017   NEUTROABS 5.9 06/15/2016    ASSESSMENT & PLAN:  Malignant neoplasm of lower-outer quadrant of right breast of female, estrogen receptor positive ([place]) 08/08/2018Right lumpectomy: Small residual focus of IDC grade 1, 0.2 cm, margins negative, ER 100%, PR 95%, HER-2 negative, Ki-67 10%, T1a N0 stage IA (07/05/2016: Breast conserving surgery: No tumor detected on the pathological evaluation.) Adjuvant radiation therapy done in [place] Longstanding nonhealing wound right breast  Current treatment: Adjuvant antiestrogen  therapy with anastrozole 1 mg daily started October 2018  Anastrozole toxicities:Occasional hot flashes denies any myalgias  Hospitalization February 2021: Pancreatitis.  She has had a few episodes of this before.  Breast cancer surveillance: 1.Breast exam breast scars do not appear to show any evidence of recurrence of malignancy.  She will be due for her mammograms in a month.  I think it is reasonable to do the mammogram at that time. 2.Mammogram7/30/2019: Benign Right breast mammogram and ultrasound: 11/09/2017 stable postlumpectomy changes right breast Patient gets mammograms at the [facility] center in [place].  We will try to get a copy of that report.   Return to clinic in 1 year for follow-up    No orders of the defined types were placed in this encounter.  The patient has a good understanding of the overall plan. she agrees with it. she will call with any problems that may develop before the next visit here.  Total time spent: 20 mins including face to face time and time spent for planning, charting and coordination of care  Nicholas Lose, MD 04/18/2019  I, Cloyde Reams Dorshimer, am acting as scribe for Dr. Nicholas Lose.  I have reviewed the above documentation for accuracy and completeness, and I agree with the above.

## 2019-04-18 ENCOUNTER — Telehealth: Payer: Self-pay | Admitting: Hematology and Oncology

## 2019-04-18 ENCOUNTER — Inpatient Hospital Stay: Payer: PPO | Attending: Hematology and Oncology | Admitting: Hematology and Oncology

## 2019-04-18 ENCOUNTER — Other Ambulatory Visit: Payer: Self-pay

## 2019-04-18 DIAGNOSIS — Z79811 Long term (current) use of aromatase inhibitors: Secondary | ICD-10-CM | POA: Insufficient documentation

## 2019-04-18 DIAGNOSIS — C50511 Malignant neoplasm of lower-outer quadrant of right female breast: Secondary | ICD-10-CM | POA: Diagnosis not present

## 2019-04-18 DIAGNOSIS — Z170 Estrogen receptor positive status [ER+]: Secondary | ICD-10-CM | POA: Insufficient documentation

## 2019-04-18 MED ORDER — ANASTROZOLE 1 MG PO TABS: 1.0000 mg | ORAL_TABLET | Freq: Every day | ORAL | 3 refills | Status: DC

## 2019-04-18 NOTE — Telephone Encounter (Signed)
I talk with patient regarding schedule  

## 2019-04-18 NOTE — Assessment & Plan Note (Signed)
08/08/2018Right lumpectomy: Small residual focus of IDC grade 1, 0.2 cm, margins negative, ER 100%, PR 95%, HER-2 negative, Ki-67 10%, T1a N0 stage IA (07/05/2016: Breast conserving surgery: No tumor detected on the pathological evaluation.) Adjuvant radiation therapy done in [place] Longstanding nonhealing wound right breast  Current treatment: Adjuvant antiestrogen therapy with anastrozole 1 mg daily started October 2018  Anastrozole toxicities:Occasional hot flashes denies any myalgias  Breast cancer surveillance: 1.Breast exam breast scars do not appear to show any evidence of recurrence of malignancy.  She will be due for her mammograms in a month.  I think it is reasonable to do the mammogram at that time. 2.Mammogram7/30/2019: Benign Right breast mammogram and ultrasound: 11/09/2017 stable postlumpectomy changes right breast She has not had a mammogram last year.  We will try to set it up as soon as possible.  Return to clinic in 1 year for follow-up

## 2019-04-30 DIAGNOSIS — R1084 Generalized abdominal pain: Secondary | ICD-10-CM | POA: Diagnosis not present

## 2019-04-30 DIAGNOSIS — Z6830 Body mass index (BMI) 30.0-30.9, adult: Secondary | ICD-10-CM | POA: Diagnosis not present

## 2019-05-06 ENCOUNTER — Encounter (INDEPENDENT_AMBULATORY_CARE_PROVIDER_SITE_OTHER): Payer: Self-pay | Admitting: Gastroenterology

## 2019-05-07 ENCOUNTER — Ambulatory Visit (INDEPENDENT_AMBULATORY_CARE_PROVIDER_SITE_OTHER): Payer: PPO | Admitting: Internal Medicine

## 2019-05-07 ENCOUNTER — Encounter (INDEPENDENT_AMBULATORY_CARE_PROVIDER_SITE_OTHER): Payer: Self-pay | Admitting: Internal Medicine

## 2019-05-07 ENCOUNTER — Other Ambulatory Visit: Payer: Self-pay

## 2019-05-07 VITALS — BP 133/77 | HR 85 | Temp 97.3°F | Ht 64.0 in | Wt 168.6 lb

## 2019-05-07 DIAGNOSIS — K859 Acute pancreatitis without necrosis or infection, unspecified: Secondary | ICD-10-CM | POA: Insufficient documentation

## 2019-05-07 DIAGNOSIS — K8500 Idiopathic acute pancreatitis without necrosis or infection: Secondary | ICD-10-CM | POA: Diagnosis not present

## 2019-05-07 DIAGNOSIS — K861 Other chronic pancreatitis: Secondary | ICD-10-CM | POA: Diagnosis not present

## 2019-05-07 MED ORDER — PANTOPRAZOLE SODIUM 40 MG PO TBEC: 40.0000 mg | DELAYED_RELEASE_TABLET | Freq: Every day | ORAL | 5 refills | Status: AC

## 2019-05-07 NOTE — Progress Notes (Signed)
Presenting complaint;  Recent hospitalization for pancreatitis.   Persistent epigastric pain and nausea.  Database and subjective:  Patient is 64 year old Caucasian female who has history of pancreatitis dating back to 2006 when she was evaluated at [facility] and EUS suggested micro lithiasis and bile duct.  She underwent ERCP with sphincterotomy by me in March 2006.  Patient has had intermittent episodes of pancreatitis since then.  She has been on pancreatic enzyme supplement which she stopped about 2 years ago. Based on imaging studies at [facility] she was told that she has chronic pancreatitis. She had flareup in December 2017 when she had ultrasound revealing bile duct of 12.2 mm and fatty liver.  She had follow-up MR which revealed mildly dilated extrahepatic biliary system without filling defects.  Mild dilation of bile duct was felt to be post cholecystectomy. I last saw her in the office in September 2018 for epigastric pain and diarrhea.  She could have an infection or IBS.  Stool studies were requested but not completed.  She was treated with dicyclomine.  She was taking pancreatic enzyme supplement at that time. Patient states she was doing well until about a month ago when she developed epigastric pain with nausea and she had single episode of vomiting.  She went on a limited diet.  However her pain continued and after 4 days she was seen by Dr. Sherrie Sport.  She was diagnosed with acute pancreatitis and hospitalized at [facility].  Patient improved somewhat with medical therapy and she was discharged few days later. Patient states her pain is not getting better.  She has constant pain which is worse after meals.  She has nausea.  She describes this pain to be sharp and it radiates to the right and into her back.  She has more pain when she lies flat.  Therefore she has been sleeping in a recliner since she was discharged from the hospital.  She has not experienced fever chills melena or rectal  bleeding.  Her appetite is not good.  She has lost 17 pounds since her last visit of September 2018.  She states weight loss occurred while she was undergoing therapy for breast cancer which was diagnosed in 2018.  She remains in remission.  She is taking pain medication 4 times a day which is primarily for back pain and she feels it is also providing some relief of epigastric pain. She does not take OTC NSAIDs.  No history of peptic ulcer disease.  She has never drank alcohol.  She is smoke vapor cigarettes.   Current Medications: Outpatient Encounter Medications as of 05/07/2019  Medication Sig  . acetaminophen (TYLENOL) 325 MG tablet Take 325 mg by mouth daily as needed for moderate pain.   Marland Kitchen alendronate (FOSAMAX) 70 MG tablet Take 70 mg by mouth every Monday. Take with a full glass of water on an empty stomach.   . ALPRAZolam (XANAX) 0.5 MG tablet Take 0.5 mg by mouth at bedtime as needed for anxiety or sleep.   Marland Kitchen amLODipine (NORVASC) 5 MG tablet Take 5 mg by mouth daily.  Marland Kitchen anastrozole (ARIMIDEX) 1 MG tablet Take 1 tablet (1 mg total) by mouth daily.  Marland Kitchen atorvastatin (LIPITOR) 20 MG tablet Take 20 mg by mouth every evening.   Marland Kitchen lisinopril (PRINIVIL,ZESTRIL) 40 MG tablet Take 40 mg by mouth daily.  . metFORMIN (GLUCOPHAGE) 500 MG tablet Take 500 mg by mouth 2 (two) times daily.   . Oxycodone HCl 10 MG TABS Take 10 mg by mouth  3 (three) times daily.   . Pancrelipase, Lip-Prot-Amyl, (CREON) 24000-76000 units CPEP Take 2 capsules by mouth 3 (three) times daily before meals.   . triamterene-hydrochlorothiazide (MAXZIDE) 75-50 MG tablet Take 1 tablet by mouth daily.  . [DISCONTINUED] hydrOXYzine (VISTARIL) 25 MG capsule Take 25 mg by mouth 3 (three) times daily as needed for itching.   No facility-administered encounter medications on file as of 05/07/2019.   Past Medical History:  Diagnosis Date  . Anxiety   . Breast abscess    right breast   . Cancer ([place]) 06/2016   invasive ductal  RIGHT   . Chronic back pain   . Chronic pancreatitis ([place]) 01/27/2016  . Depression   . Diabetes ([place]) 01/27/2016   TYPE 2  . Genetic testing 07/22/2016   Ms. Henes underwent genetic counseling and testing for hereditary cancer syndromes on 06/15/2016. Her results were negative for mutations in all 46 genes analyzed by [facility]'s 46-gene Common Hereditary Cancers Panel. Genes analyzed include: APC, ATM, AXIN2, BARD1, BMPR1A, BRCA1, BRCA2, BRIP1, CDH1, CDKN2A, CHEK2, CTNNA1, DICER1, EPCAM, GREM1, HOXB13, KIT, MEN1, MLH1, MSH2, MSH3, MSH6, MUTYH, NBN,  . GERD (gastroesophageal reflux disease)    HX OF  . Glaucoma 01/27/2016   OFF MEDS BOTH EYES  . High cholesterol 01/27/2016  . History of kidney stones   . Hypertension   . Personal history of radiation therapy    2018  .       .    .       Objective: Blood pressure 133/77, pulse 85, temperature (!) 97.3 F (36.3 C), temperature source Temporal, height '5\' 4"'  (1.626 m), weight 168 lb 9.6 oz (76.5 kg). Patient is alert and in no acute distress. She is wearing a facial mask. Conjunctiva is pink. Sclera is nonicteric Oropharyngeal mucosa is normal. She has upper and lower dentures in place. No neck masses or thyromegaly noted. Cardiac exam with regular rhythm normal S1 and S2. No murmur or gallop noted. Lungs are clear to auscultation. Abdomen is symmetrical.  Bowel sounds are normal.  On palpation abdomen is soft.  She has mild tenderness in both right and left upper quadrants and moderate tenderness mid epigastric region.  No organomegaly or masses. No LE edema or clubbing noted.   Assessment:   Acute on chronic pancreatitis.  15 years ago she was thought to have pancreatitis secondary to microlithiasis and underwent biliary sphincterotomy but it did not ameliorate her disease.  She was on pancreatic enzyme supplement but she stopped it sometime after her September 2018 visit.  Since her episode of acute pancreatitis 1 month ago she has  not improved any.  Therefore 1 has to worry about complications such as pseudocyst.  Plan:  Will review imaging studies with help of Dr. Thornton Papas.  I am unable to access imaging studies from the office. Patient will continue low-fat diet. Pantoprazole 40 mg by mouth 30 minutes before breakfast daily. Patient will go to the lab for CBC, serum lipase and comprehensive chemistry panel. We will schedule patient for abdominal pelvic CT with IV contrast only as she is intolerant of oral contrast. Further recommendations will be made when CT completed. Office visit in 2 months.

## 2019-05-07 NOTE — Patient Instructions (Signed)
2 Creon capsules with each meal and 1 with snack. Low-fat diet and avoid red meat. Physician will call with results of blood tests and CT scan when completed.

## 2019-05-08 LAB — COMPREHENSIVE METABOLIC PANEL
AG Ratio: 2 (calc) (ref 1.0–2.5)
ALT: 20 U/L (ref 6–29)
AST: 19 U/L (ref 10–35)
Albumin: 4.5 g/dL (ref 3.6–5.1)
Alkaline phosphatase (APISO): 79 U/L (ref 37–153)
BUN: 10 mg/dL (ref 7–25)
CO2: 29 mmol/L (ref 20–32)
Calcium: 9.9 mg/dL (ref 8.6–10.4)
Chloride: 105 mmol/L (ref 98–110)
Creat: 0.71 mg/dL (ref 0.50–0.99)
Globulin: 2.3 g/dL (calc) (ref 1.9–3.7)
Glucose, Bld: 112 mg/dL (ref 65–139)
Potassium: 4.2 mmol/L (ref 3.5–5.3)
Sodium: 140 mmol/L (ref 135–146)
Total Bilirubin: 0.6 mg/dL (ref 0.2–1.2)
Total Protein: 6.8 g/dL (ref 6.1–8.1)

## 2019-05-08 LAB — CBC
HCT: 44.3 % (ref 35.0–45.0)
Hemoglobin: 15 g/dL (ref 11.7–15.5)
MCH: 29.2 pg (ref 27.0–33.0)
MCHC: 33.9 g/dL (ref 32.0–36.0)
MCV: 86.4 fL (ref 80.0–100.0)
MPV: 9.6 fL (ref 7.5–12.5)
Platelets: 250 10*3/uL (ref 140–400)
RBC: 5.13 10*6/uL — ABNORMAL HIGH (ref 3.80–5.10)
RDW: 12.1 % (ref 11.0–15.0)
WBC: 7.7 10*3/uL (ref 3.8–10.8)

## 2019-05-08 LAB — LIPASE: Lipase: 21 U/L (ref 7–60)

## 2019-05-09 ENCOUNTER — Other Ambulatory Visit: Payer: Self-pay

## 2019-05-09 ENCOUNTER — Ambulatory Visit (HOSPITAL_COMMUNITY)
Admission: RE | Admit: 2019-05-09 | Discharge: 2019-05-09 | Disposition: A | Discharge status: Home / Self Care | Payer: PPO | Source: Ambulatory Visit | Attending: Internal Medicine | Admitting: Internal Medicine

## 2019-05-09 DIAGNOSIS — K8500 Idiopathic acute pancreatitis without necrosis or infection: Secondary | ICD-10-CM | POA: Insufficient documentation

## 2019-05-09 DIAGNOSIS — I700 Atherosclerosis of aorta: Secondary | ICD-10-CM | POA: Diagnosis not present

## 2019-05-09 MED ORDER — IOHEXOL 300 MG/ML  SOLN
100.0000 mL | Freq: Once | INTRAMUSCULAR | Status: AC | PRN
  Administered 2019-05-09: 12:00:00 100 mL via INTRAVENOUS

## 2019-05-28 DIAGNOSIS — M65311 Trigger thumb, right thumb: Secondary | ICD-10-CM | POA: Diagnosis not present

## 2019-05-28 DIAGNOSIS — H04301 Unspecified dacryocystitis of right lacrimal passage: Secondary | ICD-10-CM | POA: Diagnosis not present

## 2019-05-28 DIAGNOSIS — Z6829 Body mass index (BMI) 29.0-29.9, adult: Secondary | ICD-10-CM | POA: Diagnosis not present

## 2019-06-12 DIAGNOSIS — Z01419 Encounter for gynecological examination (general) (routine) without abnormal findings: Secondary | ICD-10-CM | POA: Diagnosis not present

## 2019-06-17 DIAGNOSIS — Z170 Estrogen receptor positive status [ER+]: Secondary | ICD-10-CM | POA: Diagnosis not present

## 2019-06-17 DIAGNOSIS — C50511 Malignant neoplasm of lower-outer quadrant of right female breast: Secondary | ICD-10-CM | POA: Diagnosis not present

## 2019-06-27 ENCOUNTER — Ambulatory Visit (INDEPENDENT_AMBULATORY_CARE_PROVIDER_SITE_OTHER): Payer: PPO | Admitting: Internal Medicine

## 2019-06-27 ENCOUNTER — Other Ambulatory Visit: Payer: Self-pay

## 2019-06-27 ENCOUNTER — Encounter (INDEPENDENT_AMBULATORY_CARE_PROVIDER_SITE_OTHER): Payer: Self-pay | Admitting: Internal Medicine

## 2019-06-27 VITALS — BP 146/81 | HR 75 | Temp 97.4°F | Ht 64.0 in | Wt 168.8 lb

## 2019-06-27 DIAGNOSIS — K861 Other chronic pancreatitis: Secondary | ICD-10-CM

## 2019-06-27 DIAGNOSIS — K219 Gastro-esophageal reflux disease without esophagitis: Secondary | ICD-10-CM

## 2019-06-27 NOTE — Patient Instructions (Signed)
Please notify if you have another episode of moderate to severe epigastric pain in which case we will do blood work LFTs serum amylase and lipase.

## 2019-06-27 NOTE — Progress Notes (Signed)
Presenting complaint;  Chronic pancreatitis.  Epigastric pain.  Database and subjective:  Patient is 64 year old Caucasian female who has history of pancreatitis dating back to 15 years ago.  She has had ERCP with sphincterotomy in the past as EUS had suggested choledocholithiasis but none was found.  She had a second EUS at [facility] revealing changes of chronic pancreatitis. Patient had an episode of abdominal pain back in February 2021 when she was felt to have flareup of pancreatitis and she was admitted to [facility].  Her serum lipase was elevated.  She did not undergo any imaging studies.  She was seen in the office on 05/07/2019.  She had abdominal pelvic CT on 05/09/2019 and it did not show any evidence of pancreatitis dilated bile duct or choledocholithiasis.  She was noted to have fat-containing pelvic/ventral wall hernia and advanced coronary artery atherosclerosis.  I recommended observation regarding her GI symptoms. Patient states she is feeling better she has not had an episode of severe epigastric pain.  She has postprandial pain every now and then.  She is on oxycodone for chronic low back pain and she feels it helps epigastric pain as well.  She tried dicyclomine but it did not help.  She states she does not eat fried food and eats less meat.  She is watching her diet very closely.  She says PPI is working very well.  She denies dysphagia nausea or vomiting.  She complains of pain in lumbar region which is radiating into her left lower extremity.  She has noted slight numbness.  She is planning to see Dr. Trenton Gammon.  She has had 4 back surgeries and has hardware in her back. She also complains of insomnia.  She says she rarely sleeps more than 4 to 5 hours a night.  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 She states her bowels move daily.  She denies melena or rectal bleeding.   Current Medications: Outpatient Encounter Medications as of 06/27/2019   Medication Sig  . acetaminophen (TYLENOL) 325 MG tablet Take 325 mg by mouth daily as needed for moderate pain.   Marland Kitchen alendronate (FOSAMAX) 70 MG tablet Take 70 mg by mouth every Monday. Take with a full glass of water on an empty stomach.   . ALPRAZolam (XANAX) 0.5 MG tablet Take 0.5 mg by mouth at bedtime as needed for anxiety or sleep.   Marland Kitchen amLODipine (NORVASC) 5 MG tablet Take 5 mg by mouth daily.  Marland Kitchen anastrozole (ARIMIDEX) 1 MG tablet Take 1 tablet (1 mg total) by mouth daily.  Marland Kitchen atorvastatin (LIPITOR) 20 MG tablet Take 20 mg by mouth every evening.   Marland Kitchen lisinopril (PRINIVIL,ZESTRIL) 40 MG tablet Take 40 mg by mouth daily.  . metFORMIN (GLUCOPHAGE) 500 MG tablet Take 500 mg by mouth 2 (two) times daily.   . Oxycodone HCl 10 MG TABS Take 10 mg by mouth 3 (three) times daily.   . Pancrelipase, Lip-Prot-Amyl, (CREON) 24000-76000 units CPEP Take 2 capsules by mouth 3 (three) times daily before meals.   . pantoprazole (PROTONIX) 40 MG tablet Take 1 tablet (40 mg total) by mouth daily before breakfast.  . triamterene-hydrochlorothiazide (MAXZIDE) 75-50 MG tablet Take 1 tablet by mouth daily.   No facility-administered encounter medications on file as of 06/27/2019.     Objective: Blood pressure (!) 146/81, pulse 75, temperature (!) 97.4 F (36.3 C), temperature source Temporal, height '5\' 4"'  (1.626 m), weight 168 lb 12.8 oz (76.6  kg). Patient is alert and in no acute distress. She is wearing a facial mask. Conjunctiva is pink. Sclera is nonicteric Oropharyngeal mucosa is normal. No neck masses or thyromegaly noted. Cardiac exam with regular rhythm normal S1 and S2. No murmur or gallop noted. Lungs are clear to auscultation. Abdomen is full.  No bruits noted.  On palpation abdomen is soft.  She has mild midepigastric tenderness.  No organomegaly or masses. No LE edema or clubbing noted.  Labs/studies Results:  CBC Latest Ref Rng & Units 05/07/2019 06/04/2017 06/02/2017  WBC 3.8 - 10.8  Thousand/uL 7.7 11.5(H) 8.4  Hemoglobin 11.7 - 15.5 g/dL 15.0 12.7 14.9  Hematocrit 35.0 - 45.0 % 44.3 38.5 44.5  Platelets 140 - 400 Thousand/uL 250 244 236    CMP Latest Ref Rng & Units 05/07/2019 06/04/2017 06/02/2017  Glucose 65 - 139 mg/dL 112 143(H) 130(H)  BUN 7 - 25 mg/dL '10 18 9  ' Creatinine 0.50 - 0.99 mg/dL 0.71 1.01(H) 0.75  Sodium 135 - 146 mmol/L 140 136 138  Potassium 3.5 - 5.3 mmol/L 4.2 4.3 4.2  Chloride 98 - 110 mmol/L 105 105 103  CO2 20 - 32 mmol/L '29 23 24  ' Calcium 8.6 - 10.4 mg/dL 9.9 9.4 9.2  Total Protein 6.1 - 8.1 g/dL 6.8 - 7.1  Total Bilirubin 0.2 - 1.2 mg/dL 0.6 - 0.7  Alkaline Phos 38 - 126 U/L - - 69  AST 10 - 35 U/L 19 - 19  ALT 6 - 29 U/L 20 - 18    Hepatic Function Latest Ref Rng & Units 05/07/2019 06/02/2017 06/15/2016  Total Protein 6.1 - 8.1 g/dL 6.8 7.1 7.3  Albumin 3.5 - 5.0 g/dL - 3.8 4.0  AST 10 - 35 U/L '19 19 29  ' ALT 6 - 29 U/L 20 18 40  Alk Phosphatase 38 - 126 U/L - 69 74  Total Bilirubin 0.2 - 1.2 mg/dL 0.6 0.7 0.71     Assessment:  #1.  Chronic epigastric pain with intermittent flareup most likely secondary to chronic pancreatitis.  Chronic pancreatitis was diagnosed on EUS over 15 years ago.  She is on pancreatic enzyme supplement which seems to have helped decrease pain severity.  Recent CT did not show any evidence of pseudocyst or dilated pancreatic duct.  If she has another episode of acute pancreatitis will consider reevaluation with endoscopic ultrasound.  #2.  GERD.  She is doing well with therapy.  #3.  Abdominal pelvic CT last month revealed cautery artery calcifications.  She may benefit from evaluation by cardiologist.  She should discuss with Dr. Sherrie Sport.  #4.  Low back pain radiating to left lower extremity.  Symptoms suggest she got it caught nerve pain.  Patient advised to follow-up with Dr. Trenton Gammon.   Plan:  Patient will continue a low-fat diet. Continue pantoprazole at current dose of 40 mg by mouth 30 minutes before  breakfast daily and pancreatic enzyme supplement as before. If patient has another episode of severe pain will check her LFTs amylase and lipase and go from there. Office visit in 6 months.

## 2019-07-01 DIAGNOSIS — Z0000 Encounter for general adult medical examination without abnormal findings: Secondary | ICD-10-CM | POA: Diagnosis not present

## 2019-07-01 DIAGNOSIS — I10 Essential (primary) hypertension: Secondary | ICD-10-CM | POA: Diagnosis not present

## 2019-07-01 DIAGNOSIS — H04301 Unspecified dacryocystitis of right lacrimal passage: Secondary | ICD-10-CM | POA: Diagnosis not present

## 2019-07-01 DIAGNOSIS — Z6829 Body mass index (BMI) 29.0-29.9, adult: Secondary | ICD-10-CM | POA: Diagnosis not present

## 2019-07-01 DIAGNOSIS — Z1331 Encounter for screening for depression: Secondary | ICD-10-CM | POA: Diagnosis not present

## 2019-07-01 DIAGNOSIS — E119 Type 2 diabetes mellitus without complications: Secondary | ICD-10-CM | POA: Diagnosis not present

## 2019-07-01 DIAGNOSIS — M65311 Trigger thumb, right thumb: Secondary | ICD-10-CM | POA: Diagnosis not present

## 2019-07-02 ENCOUNTER — Ambulatory Visit (INDEPENDENT_AMBULATORY_CARE_PROVIDER_SITE_OTHER): Payer: PPO | Admitting: Internal Medicine

## 2019-07-03 ENCOUNTER — Encounter (INDEPENDENT_AMBULATORY_CARE_PROVIDER_SITE_OTHER): Payer: Self-pay | Admitting: *Deleted

## 2019-07-25 ENCOUNTER — Other Ambulatory Visit: Payer: Self-pay | Admitting: Neurosurgery

## 2019-07-25 DIAGNOSIS — M5416 Radiculopathy, lumbar region: Secondary | ICD-10-CM

## 2019-08-06 DIAGNOSIS — M48061 Spinal stenosis, lumbar region without neurogenic claudication: Secondary | ICD-10-CM | POA: Diagnosis not present

## 2019-08-06 DIAGNOSIS — M5416 Radiculopathy, lumbar region: Secondary | ICD-10-CM | POA: Diagnosis not present

## 2019-08-06 DIAGNOSIS — M5127 Other intervertebral disc displacement, lumbosacral region: Secondary | ICD-10-CM | POA: Diagnosis not present

## 2019-08-14 ENCOUNTER — Ambulatory Visit (INDEPENDENT_AMBULATORY_CARE_PROVIDER_SITE_OTHER): Payer: PPO | Admitting: Gastroenterology

## 2019-08-15 DIAGNOSIS — M5416 Radiculopathy, lumbar region: Secondary | ICD-10-CM | POA: Diagnosis not present

## 2019-08-15 DIAGNOSIS — M4310 Spondylolisthesis, site unspecified: Secondary | ICD-10-CM | POA: Diagnosis not present

## 2019-08-19 ENCOUNTER — Other Ambulatory Visit (INDEPENDENT_AMBULATORY_CARE_PROVIDER_SITE_OTHER): Payer: Self-pay | Admitting: *Deleted

## 2019-08-19 ENCOUNTER — Encounter (INDEPENDENT_AMBULATORY_CARE_PROVIDER_SITE_OTHER): Payer: Self-pay | Admitting: *Deleted

## 2019-08-19 ENCOUNTER — Telehealth (INDEPENDENT_AMBULATORY_CARE_PROVIDER_SITE_OTHER): Payer: Self-pay | Admitting: *Deleted

## 2019-08-19 DIAGNOSIS — Z860100 Personal history of colon polyps, unspecified: Secondary | ICD-10-CM

## 2019-08-19 MED ORDER — PLENVU 140 G PO SOLR: 1.0000 | Freq: Once | ORAL | 0 refills | Status: AC

## 2019-08-19 NOTE — Telephone Encounter (Signed)
Patient needs Plenvu (copay card) ° °

## 2019-08-19 NOTE — Telephone Encounter (Signed)
Referring MD/PCP: hasanaj   Procedure: tcs w propofol  Reason/Indication:  Hx polyps  Has patient had this procedure before?  Yes, 2014 - Britta Mccreedy  If so, when, by whom and where?    Is there a family history of colon cancer?  no  Who?  What age when diagnosed?    Is patient diabetic?   yes      Does patient have prosthetic heart valve or mechanical valve?  no  Do you have a pacemaker/defibrillator?  no  Has patient ever had endocarditis/atrial fibrillation? no  Does patient use oxygen? no  Has patient had joint replacement within last 12 months?  no  Is patient constipated or do they take laxatives? no  Does patient have a history of alcohol/drug use?  no  Is patient on blood thinner such as Coumadin, Plavix and/or Aspirin? no  Medications: anastrozole 1 mg daily, metformin 500 mg bid, atorvastatin 20 mg daily, lisinopril 40 mg daily, triam.hctz 75/50 mg daily, amlodipine 5 mg daily, oxycodone 10 mg tid, alprazolam 0.5 mg daily  Allergies: morphine, ampicillin, barium  Medication Adjustment per Dr Rehman/Dr Jenetta Downer hold diabetic meds evening before and morning of  Procedure date & time: 10/02/19

## 2019-08-26 ENCOUNTER — Telehealth (INDEPENDENT_AMBULATORY_CARE_PROVIDER_SITE_OTHER): Payer: Self-pay | Admitting: Internal Medicine

## 2019-08-26 NOTE — Telephone Encounter (Signed)
Patient would like a call back regarding her prep for her colonoscopy - please advise - [phone]

## 2019-08-26 NOTE — Telephone Encounter (Signed)
Spoke to patient, answered questions about prep

## 2019-08-29 ENCOUNTER — Other Ambulatory Visit: Payer: PPO

## 2019-08-31 ENCOUNTER — Other Ambulatory Visit: Payer: PPO

## 2019-09-02 DIAGNOSIS — Z0000 Encounter for general adult medical examination without abnormal findings: Secondary | ICD-10-CM | POA: Diagnosis not present

## 2019-09-02 DIAGNOSIS — E119 Type 2 diabetes mellitus without complications: Secondary | ICD-10-CM | POA: Diagnosis not present

## 2019-09-02 DIAGNOSIS — I10 Essential (primary) hypertension: Secondary | ICD-10-CM | POA: Diagnosis not present

## 2019-09-02 DIAGNOSIS — Z6829 Body mass index (BMI) 29.0-29.9, adult: Secondary | ICD-10-CM | POA: Diagnosis not present

## 2019-09-02 DIAGNOSIS — E7849 Other hyperlipidemia: Secondary | ICD-10-CM | POA: Diagnosis not present

## 2019-09-03 DIAGNOSIS — H2513 Age-related nuclear cataract, bilateral: Secondary | ICD-10-CM | POA: Diagnosis not present

## 2019-09-03 DIAGNOSIS — H353191 Nonexudative age-related macular degeneration, unspecified eye, early dry stage: Secondary | ICD-10-CM | POA: Diagnosis not present

## 2019-09-03 DIAGNOSIS — H40053 Ocular hypertension, bilateral: Secondary | ICD-10-CM | POA: Diagnosis not present

## 2019-09-03 DIAGNOSIS — H524 Presbyopia: Secondary | ICD-10-CM | POA: Diagnosis not present

## 2019-09-03 DIAGNOSIS — H52203 Unspecified astigmatism, bilateral: Secondary | ICD-10-CM | POA: Diagnosis not present

## 2019-09-03 DIAGNOSIS — H5203 Hypermetropia, bilateral: Secondary | ICD-10-CM | POA: Diagnosis not present

## 2019-09-03 DIAGNOSIS — H35073 Retinal telangiectasis, bilateral: Secondary | ICD-10-CM | POA: Diagnosis not present

## 2019-09-03 DIAGNOSIS — E119 Type 2 diabetes mellitus without complications: Secondary | ICD-10-CM | POA: Diagnosis not present

## 2019-09-13 DIAGNOSIS — M48062 Spinal stenosis, lumbar region with neurogenic claudication: Secondary | ICD-10-CM | POA: Diagnosis not present

## 2019-09-13 DIAGNOSIS — M5416 Radiculopathy, lumbar region: Secondary | ICD-10-CM | POA: Diagnosis not present

## 2019-09-25 DIAGNOSIS — R928 Other abnormal and inconclusive findings on diagnostic imaging of breast: Secondary | ICD-10-CM | POA: Diagnosis not present

## 2019-09-25 DIAGNOSIS — C50511 Malignant neoplasm of lower-outer quadrant of right female breast: Secondary | ICD-10-CM | POA: Diagnosis not present

## 2019-09-25 DIAGNOSIS — Z170 Estrogen receptor positive status [ER+]: Secondary | ICD-10-CM | POA: Diagnosis not present

## 2019-09-30 ENCOUNTER — Other Ambulatory Visit (HOSPITAL_COMMUNITY): Payer: PPO

## 2019-10-02 ENCOUNTER — Encounter (HOSPITAL_COMMUNITY): Payer: Self-pay

## 2019-10-02 ENCOUNTER — Ambulatory Visit (HOSPITAL_COMMUNITY): Admit: 2019-10-02 | Payer: PPO | Admitting: Internal Medicine

## 2019-10-02 DIAGNOSIS — H25813 Combined forms of age-related cataract, bilateral: Secondary | ICD-10-CM | POA: Diagnosis not present

## 2019-10-02 DIAGNOSIS — M5416 Radiculopathy, lumbar region: Secondary | ICD-10-CM | POA: Diagnosis not present

## 2019-10-02 DIAGNOSIS — H35363 Drusen (degenerative) of macula, bilateral: Secondary | ICD-10-CM | POA: Diagnosis not present

## 2019-10-02 DIAGNOSIS — H31013 Macula scars of posterior pole (postinflammatory) (post-traumatic), bilateral: Secondary | ICD-10-CM | POA: Diagnosis not present

## 2019-10-02 DIAGNOSIS — H53413 Scotoma involving central area, bilateral: Secondary | ICD-10-CM | POA: Diagnosis not present

## 2019-10-02 DIAGNOSIS — H35342 Macular cyst, hole, or pseudohole, left eye: Secondary | ICD-10-CM | POA: Diagnosis not present

## 2019-10-02 DIAGNOSIS — E119 Type 2 diabetes mellitus without complications: Secondary | ICD-10-CM | POA: Diagnosis not present

## 2019-10-02 SURGERY — COLONOSCOPY WITH PROPOFOL
Anesthesia: Monitor Anesthesia Care

## 2019-10-08 DIAGNOSIS — Z23 Encounter for immunization: Secondary | ICD-10-CM | POA: Diagnosis not present

## 2019-11-04 DIAGNOSIS — Z0000 Encounter for general adult medical examination without abnormal findings: Secondary | ICD-10-CM | POA: Diagnosis not present

## 2019-11-04 DIAGNOSIS — Z6830 Body mass index (BMI) 30.0-30.9, adult: Secondary | ICD-10-CM | POA: Diagnosis not present

## 2019-11-04 DIAGNOSIS — I10 Essential (primary) hypertension: Secondary | ICD-10-CM | POA: Diagnosis not present

## 2019-11-04 DIAGNOSIS — E7849 Other hyperlipidemia: Secondary | ICD-10-CM | POA: Diagnosis not present

## 2019-11-04 DIAGNOSIS — M545 Low back pain: Secondary | ICD-10-CM | POA: Diagnosis not present

## 2019-11-04 DIAGNOSIS — E119 Type 2 diabetes mellitus without complications: Secondary | ICD-10-CM | POA: Diagnosis not present

## 2019-11-04 DIAGNOSIS — F411 Generalized anxiety disorder: Secondary | ICD-10-CM | POA: Diagnosis not present

## 2019-12-18 DIAGNOSIS — C50511 Malignant neoplasm of lower-outer quadrant of right female breast: Secondary | ICD-10-CM | POA: Diagnosis not present

## 2019-12-18 DIAGNOSIS — R2232 Localized swelling, mass and lump, left upper limb: Secondary | ICD-10-CM | POA: Diagnosis not present

## 2019-12-18 DIAGNOSIS — Z170 Estrogen receptor positive status [ER+]: Secondary | ICD-10-CM | POA: Diagnosis not present

## 2019-12-18 DIAGNOSIS — Z1211 Encounter for screening for malignant neoplasm of colon: Secondary | ICD-10-CM | POA: Diagnosis not present

## 2019-12-30 DIAGNOSIS — R928 Other abnormal and inconclusive findings on diagnostic imaging of breast: Secondary | ICD-10-CM | POA: Diagnosis not present

## 2019-12-30 DIAGNOSIS — N6489 Other specified disorders of breast: Secondary | ICD-10-CM | POA: Diagnosis not present

## 2019-12-30 DIAGNOSIS — N6321 Unspecified lump in the left breast, upper outer quadrant: Secondary | ICD-10-CM | POA: Diagnosis not present

## 2019-12-30 DIAGNOSIS — R2232 Localized swelling, mass and lump, left upper limb: Secondary | ICD-10-CM | POA: Diagnosis not present

## 2019-12-31 ENCOUNTER — Ambulatory Visit (INDEPENDENT_AMBULATORY_CARE_PROVIDER_SITE_OTHER): Payer: PPO | Admitting: Internal Medicine

## 2020-01-06 DIAGNOSIS — E1143 Type 2 diabetes mellitus with diabetic autonomic (poly)neuropathy: Secondary | ICD-10-CM | POA: Diagnosis not present

## 2020-01-06 DIAGNOSIS — E7849 Other hyperlipidemia: Secondary | ICD-10-CM | POA: Diagnosis not present

## 2020-01-06 DIAGNOSIS — Z6831 Body mass index (BMI) 31.0-31.9, adult: Secondary | ICD-10-CM | POA: Diagnosis not present

## 2020-01-06 DIAGNOSIS — I10 Essential (primary) hypertension: Secondary | ICD-10-CM | POA: Diagnosis not present

## 2020-01-21 DIAGNOSIS — Z01818 Encounter for other preprocedural examination: Secondary | ICD-10-CM | POA: Diagnosis not present

## 2020-01-23 DIAGNOSIS — Z79811 Long term (current) use of aromatase inhibitors: Secondary | ICD-10-CM | POA: Diagnosis not present

## 2020-01-23 DIAGNOSIS — E785 Hyperlipidemia, unspecified: Secondary | ICD-10-CM | POA: Diagnosis not present

## 2020-01-23 DIAGNOSIS — E119 Type 2 diabetes mellitus without complications: Secondary | ICD-10-CM | POA: Diagnosis not present

## 2020-01-23 DIAGNOSIS — I10 Essential (primary) hypertension: Secondary | ICD-10-CM | POA: Diagnosis not present

## 2020-01-23 DIAGNOSIS — D120 Benign neoplasm of cecum: Secondary | ICD-10-CM | POA: Diagnosis not present

## 2020-01-23 DIAGNOSIS — C50511 Malignant neoplasm of lower-outer quadrant of right female breast: Secondary | ICD-10-CM | POA: Diagnosis not present

## 2020-01-23 DIAGNOSIS — Z7983 Long term (current) use of bisphosphonates: Secondary | ICD-10-CM | POA: Diagnosis not present

## 2020-01-23 DIAGNOSIS — Z7984 Long term (current) use of oral hypoglycemic drugs: Secondary | ICD-10-CM | POA: Diagnosis not present

## 2020-01-23 DIAGNOSIS — D126 Benign neoplasm of colon, unspecified: Secondary | ICD-10-CM | POA: Diagnosis not present

## 2020-01-23 DIAGNOSIS — Z1211 Encounter for screening for malignant neoplasm of colon: Secondary | ICD-10-CM | POA: Diagnosis not present

## 2020-01-23 DIAGNOSIS — D127 Benign neoplasm of rectosigmoid junction: Secondary | ICD-10-CM | POA: Diagnosis not present

## 2020-01-23 DIAGNOSIS — Z79899 Other long term (current) drug therapy: Secondary | ICD-10-CM | POA: Diagnosis not present

## 2020-01-23 DIAGNOSIS — K641 Second degree hemorrhoids: Secondary | ICD-10-CM | POA: Diagnosis not present

## 2020-01-23 DIAGNOSIS — K648 Other hemorrhoids: Secondary | ICD-10-CM | POA: Diagnosis not present

## 2020-01-23 DIAGNOSIS — F419 Anxiety disorder, unspecified: Secondary | ICD-10-CM | POA: Diagnosis not present

## 2020-01-23 DIAGNOSIS — Z923 Personal history of irradiation: Secondary | ICD-10-CM | POA: Diagnosis not present

## 2020-01-23 DIAGNOSIS — D122 Benign neoplasm of ascending colon: Secondary | ICD-10-CM | POA: Diagnosis not present

## 2020-01-23 DIAGNOSIS — F17210 Nicotine dependence, cigarettes, uncomplicated: Secondary | ICD-10-CM | POA: Diagnosis not present

## 2020-01-23 DIAGNOSIS — Z780 Asymptomatic menopausal state: Secondary | ICD-10-CM | POA: Diagnosis not present

## 2020-01-23 DIAGNOSIS — D124 Benign neoplasm of descending colon: Secondary | ICD-10-CM | POA: Diagnosis not present

## 2020-01-23 DIAGNOSIS — K635 Polyp of colon: Secondary | ICD-10-CM | POA: Diagnosis not present

## 2020-01-23 DIAGNOSIS — Z170 Estrogen receptor positive status [ER+]: Secondary | ICD-10-CM | POA: Diagnosis not present

## 2020-03-04 DIAGNOSIS — Z0000 Encounter for general adult medical examination without abnormal findings: Secondary | ICD-10-CM | POA: Diagnosis not present

## 2020-03-04 DIAGNOSIS — E1143 Type 2 diabetes mellitus with diabetic autonomic (poly)neuropathy: Secondary | ICD-10-CM | POA: Diagnosis not present

## 2020-03-04 DIAGNOSIS — I10 Essential (primary) hypertension: Secondary | ICD-10-CM | POA: Diagnosis not present

## 2020-03-04 DIAGNOSIS — E7849 Other hyperlipidemia: Secondary | ICD-10-CM | POA: Diagnosis not present

## 2020-03-04 DIAGNOSIS — Z6831 Body mass index (BMI) 31.0-31.9, adult: Secondary | ICD-10-CM | POA: Diagnosis not present

## 2020-03-18 DIAGNOSIS — D126 Benign neoplasm of colon, unspecified: Secondary | ICD-10-CM | POA: Diagnosis not present

## 2020-04-02 DIAGNOSIS — H25813 Combined forms of age-related cataract, bilateral: Secondary | ICD-10-CM | POA: Diagnosis not present

## 2020-04-02 DIAGNOSIS — H02825 Cysts of left lower eyelid: Secondary | ICD-10-CM | POA: Diagnosis not present

## 2020-04-02 DIAGNOSIS — H31013 Macula scars of posterior pole (postinflammatory) (post-traumatic), bilateral: Secondary | ICD-10-CM | POA: Diagnosis not present

## 2020-04-02 DIAGNOSIS — H35363 Drusen (degenerative) of macula, bilateral: Secondary | ICD-10-CM | POA: Diagnosis not present

## 2020-04-02 DIAGNOSIS — H35342 Macular cyst, hole, or pseudohole, left eye: Secondary | ICD-10-CM | POA: Diagnosis not present

## 2020-04-02 DIAGNOSIS — H53413 Scotoma involving central area, bilateral: Secondary | ICD-10-CM | POA: Diagnosis not present

## 2020-04-16 NOTE — Progress Notes (Incomplete)
Patient Care Team: Neale Burly, MD as PCP - General (Internal Medicine) Excell Seltzer, MD (Inactive) as Consulting Physician (General Surgery) Nicholas Lose, MD as Consulting Physician (Hematology and Oncology) Eppie Gibson, MD as Attending Physician (Radiation Oncology) Gardenia Phlegm, NP as Nurse Practitioner (Hematology and Oncology) Audry Pili, MD as Referring Physician (Radiation Oncology)  DIAGNOSIS: No diagnosis found.  SUMMARY OF ONCOLOGIC HISTORY: Oncology History  Malignant neoplasm of lower-outer quadrant of right breast of female, estrogen receptor positive ([place])  06/08/2016 Initial Diagnosis   Right breast asymmetry posteriorly 7 mm size, no ultrasound correlate, AFFIRM biopsy: Grade 2 invasive ductal carcinoma ER 100%, PR 95%, Ki-67 10%, HER-2 negative ratio 1.58, T1 BN 0 stage IA clinical stage   06/15/2016 Genetic Testing   Genetic testing did not reveal a deleterious mutation.  Variants of uncertain significance (VUSs) called DICER1 c.1468C>T (p.Arg490Cys) and MSH3 c.2041C>T (p.Pro681Ser) were also noted.  Genes tested include: APC, ATM, AXIN2, BARD1, BMPR1A, BRCA1, BRCA2, BRIP1, CDH1, CDKN2A, CHEK2, CTNNA1, DICER1, EPCAM, GREM1, HOXB13, KIT, MEN1, MLH1, MSH2, MSH3, MSH6, MUTYH, NBN, NF1, NTHL1, PALB2, PDGFRA, PMS2, POLD1, POLE, PTEN, RAD50, RAD51C, RAD51D, SDHA, SDHB, SDHC, SDHD, SMAD4, SMARCA4, STK11, TP53, TSC1, TSC2, and VHL.  UPDATE: MSH3 c.2041C>T (p.Pro681Ser) VUS was reclassified as "Likely Benign." The report date is 12/12/2017.    07/06/2016 Surgery   Right lumpectomy: No tumor noted on the resected specimen   09/14/2016 Surgery   Right lumpectomy: Small residual focus of IDC grade 1, 0.2 cm, margins negative, ER 100%, PR 95%, HER-2 negative, Ki-67 10%, T1a N0 stage IA   10/27/2016 - 11/15/2016 Radiation Therapy   Adjuvant radiation with Dr. Sherryll Burger breast: 42.4 Gy in 16 treatments, Right breast boost: 10 Gy in 4 treatments    11/2016 -  Anti-estrogen oral therapy   Anastrozole daily   05/26/2017 Surgery   Breast abscess: Surgery performed     CHIEF COMPLIANT: Follow-up pf right breast cancer on anastrozole  INTERVAL HISTORY: Tabitha [name] is a 65 y.o. with above-mentioned history of right breast cancer treated with lumpectomy, adjuvant radiation, and who is currently on antiestrogen therapy with anastrozole. She presents to the clinic today for annual follow-up.   ALLERGIES:  is allergic to ampicillin, barium-containing compounds, penicillins, and morphine and related.  MEDICATIONS:  Current Outpatient Medications  Medication Sig Dispense Refill  . acetaminophen (TYLENOL) 325 MG tablet Take 325 mg by mouth daily as needed for moderate pain.     Marland Kitchen alendronate (FOSAMAX) 70 MG tablet Take 70 mg by mouth every Monday. Take with a full glass of water on an empty stomach.     . ALPRAZolam (XANAX) 0.5 MG tablet Take 0.5 mg by mouth at bedtime as needed for anxiety or sleep.     Marland Kitchen amLODipine (NORVASC) 5 MG tablet Take 5 mg by mouth daily.    Marland Kitchen anastrozole (ARIMIDEX) 1 MG tablet Take 1 tablet (1 mg total) by mouth daily. 90 tablet 3  . atorvastatin (LIPITOR) 20 MG tablet Take 20 mg by mouth every evening.     Marland Kitchen lisinopril (PRINIVIL,ZESTRIL) 40 MG tablet Take 40 mg by mouth daily.    . metFORMIN (GLUCOPHAGE) 500 MG tablet Take 500 mg by mouth 2 (two) times daily.     . Oxycodone HCl 10 MG TABS Take 10 mg by mouth 3 (three) times daily.     . Pancrelipase, Lip-Prot-Amyl, (CREON) 24000-76000 units CPEP Take 2 capsules by mouth 3 (three) times daily before meals.     Marland Kitchen  pantoprazole (PROTONIX) 40 MG tablet Take 1 tablet (40 mg total) by mouth daily before breakfast. 30 tablet 5  . triamterene-hydrochlorothiazide (MAXZIDE) 75-50 MG tablet Take 1 tablet by mouth daily.     No current facility-administered medications for this visit.    PHYSICAL EXAMINATION: ECOG PERFORMANCE STATUS: {CHL ONC ECOG  [phone]}  There were no vitals filed for this visit. There were no vitals filed for this visit.  BREAST:*** No palpable masses or nodules in either right or left breasts. No palpable axillary supraclavicular or infraclavicular adenopathy no breast tenderness or nipple discharge. (exam performed in the presence of a chaperone)  LABORATORY DATA:  I have reviewed the data as listed CMP Latest Ref Rng & Units 05/07/2019 06/04/2017 06/02/2017  Glucose 65 - 139 mg/dL 112 143(H) 130(H)  BUN 7 - 25 mg/dL _0 Creatinine 0.50 - 0.99 mg/dL 0.71 1.01(H) 0.75  Sodium 135 - 146 mmol/L 140 136 138  Potassium 3.5 - 5.3 mmol/L 4.2 4.3 4.2  Chloride 98 - 110 mmol/L 105 105 103  CO2 20 - 32 mmol/L _1 Calcium 8.6 - 10.4 mg/dL 9.9 9.4 9.2  Total Protein 6.1 - 8.1 g/dL 6.8 - 7.1  Total Bilirubin 0.2 - 1.2 mg/dL 0.6 - 0.7  Alkaline Phos 38 - 126 U/L - - 69  AST 10 - 35 U/L 19 - 19  ALT 6 - 29 U/L 20 - 18    Lab Results  Component Value Date   WBC 7.7 05/07/2019   HGB 15.0 05/07/2019   HCT 44.3 05/07/2019   MCV 86.4 05/07/2019   PLT 250 05/07/2019   NEUTROABS 5.9 06/15/2016    ASSESSMENT & PLAN:  No problem-specific Assessment & Plan notes found for this encounter.    No orders of the defined types were placed in this encounter.  The patient has a good understanding of the overall plan. she agrees with it. she will call with any problems that may develop before the next visit here.  Total time spent: *** mins including face to face time and time spent for planning, charting and coordination of care  Rulon Eisenmenger, MD, MPH 04/16/2020  I, Molly Dorshimer, am acting as scribe for Dr. Nicholas Lose.  {insert scribe attestation}

## 2020-04-17 ENCOUNTER — Inpatient Hospital Stay: Payer: PPO | Attending: Hematology and Oncology | Admitting: Hematology and Oncology

## 2020-04-17 ENCOUNTER — Encounter: Payer: Self-pay | Admitting: *Deleted

## 2020-04-17 NOTE — Assessment & Plan Note (Deleted)
08/08/2018Right lumpectomy: Small residual focus of IDC grade 1, 0.2 cm, margins negative, ER 100%, PR 95%, HER-2 negative, Ki-67 10%, T1a N0 stage IA (07/05/2016: Breast conserving surgery: No tumor detected on the pathological evaluation.) Adjuvant radiation therapy done in [place] Longstanding nonhealing wound right breast  Current treatment: Adjuvant antiestrogen therapy with anastrozole 1 mg daily started October 2018  Anastrozole toxicities:Occasional hot flashes denies any myalgias  Hospitalization February 2021: Pancreatitis.  She has had a few episodes of this before.  Breast cancer surveillance: 1.Breast exam3/12/2020: Benign. 2.Mammogram  Right breast mammogram and ultrasound: 11/09/2017 stable postlumpectomy changes right breast Patient gets mammograms at the [facility] center in [place].  We will try to get a copy of that report.   Return to clinic in 1 year for follow-up

## 2020-04-27 ENCOUNTER — Other Ambulatory Visit: Payer: Self-pay | Admitting: Hematology and Oncology

## 2020-05-04 DIAGNOSIS — I209 Angina pectoris, unspecified: Secondary | ICD-10-CM | POA: Diagnosis not present

## 2020-05-04 DIAGNOSIS — I10 Essential (primary) hypertension: Secondary | ICD-10-CM | POA: Diagnosis not present

## 2020-05-04 DIAGNOSIS — E1143 Type 2 diabetes mellitus with diabetic autonomic (poly)neuropathy: Secondary | ICD-10-CM | POA: Diagnosis not present

## 2020-05-04 DIAGNOSIS — Z6832 Body mass index (BMI) 32.0-32.9, adult: Secondary | ICD-10-CM | POA: Diagnosis not present

## 2020-05-04 DIAGNOSIS — E7849 Other hyperlipidemia: Secondary | ICD-10-CM | POA: Diagnosis not present

## 2020-05-08 DIAGNOSIS — I209 Angina pectoris, unspecified: Secondary | ICD-10-CM | POA: Diagnosis not present

## 2020-05-18 DIAGNOSIS — Z6832 Body mass index (BMI) 32.0-32.9, adult: Secondary | ICD-10-CM | POA: Diagnosis not present

## 2020-05-18 DIAGNOSIS — H6691 Otitis media, unspecified, right ear: Secondary | ICD-10-CM | POA: Diagnosis not present

## 2020-05-18 DIAGNOSIS — J305 Allergic rhinitis due to food: Secondary | ICD-10-CM | POA: Diagnosis not present

## 2020-06-08 NOTE — Assessment & Plan Note (Signed)
08/08/2018Right lumpectomy: Small residual focus of IDC grade 1, 0.2 cm, margins negative, ER 100%, PR 95%, HER-2 negative, Ki-67 10%, T1a N0 stage IA (07/05/2016: Breast conserving surgery: No tumor detected on the pathological evaluation.) Adjuvant radiation therapy done in [place] Longstanding nonhealing wound right breast  Current treatment: Adjuvant antiestrogen therapy with anastrozole 1 mg daily started October 2018  Anastrozole toxicities:Occasional hot flashes denies any myalgias  Hospitalization February 2021: Pancreatitis.  She has had a few episodes of this before.  Breast cancer surveillance: 1.Breast exambreast scars do not appear to show any evidence of recurrence of malignancy. She will be due for her mammograms in a month. I think it is reasonable to do the mammogram at that time. 2.Mammogram7/30/2019: Benign Right breast mammogram and ultrasound: 11/09/2017 stable postlumpectomy changes right breast Patient gets mammograms at the [facility] center in [place].  We will try to get a copy of that report.   Return to clinic in 1 year for follow-up

## 2020-06-08 NOTE — Progress Notes (Signed)
Patient Care Team: Neale Burly, MD as PCP - General (Internal Medicine) Excell Seltzer, MD (Inactive) as Consulting Physician (General Surgery) Nicholas Lose, MD as Consulting Physician (Hematology and Oncology) Eppie Gibson, MD as Attending Physician (Radiation Oncology) Gardenia Phlegm, NP as Nurse Practitioner (Hematology and Oncology) Audry Pili, MD as Referring Physician (Radiation Oncology)  DIAGNOSIS:    ICD-10-CM   1. Malignant neoplasm of lower-outer quadrant of right breast of female, estrogen receptor positive ([place])  C50.511    Z17.0     SUMMARY OF ONCOLOGIC HISTORY: Oncology History  Malignant neoplasm of lower-outer quadrant of right breast of female, estrogen receptor positive ([place])  06/08/2016 Initial Diagnosis   Right breast asymmetry posteriorly 7 mm size, no ultrasound correlate, AFFIRM biopsy: Grade 2 invasive ductal carcinoma ER 100%, PR 95%, Ki-67 10%, HER-2 negative ratio 1.58, T1 BN 0 stage IA clinical stage   06/15/2016 Genetic Testing   Genetic testing did not reveal a deleterious mutation.  Variants of uncertain significance (VUSs) called DICER1 c.1468C>T (p.Arg490Cys) and MSH3 c.2041C>T (p.Pro681Ser) were also noted.  Genes tested include: APC, ATM, AXIN2, BARD1, BMPR1A, BRCA1, BRCA2, BRIP1, CDH1, CDKN2A, CHEK2, CTNNA1, DICER1, EPCAM, GREM1, HOXB13, KIT, MEN1, MLH1, MSH2, MSH3, MSH6, MUTYH, NBN, NF1, NTHL1, PALB2, PDGFRA, PMS2, POLD1, POLE, PTEN, RAD50, RAD51C, RAD51D, SDHA, SDHB, SDHC, SDHD, SMAD4, SMARCA4, STK11, TP53, TSC1, TSC2, and VHL.  UPDATE: MSH3 c.2041C>T (p.Pro681Ser) VUS was reclassified as "Likely Benign." The report date is 12/12/2017.    07/06/2016 Surgery   Right lumpectomy: No tumor noted on the resected specimen   09/14/2016 Surgery   Right lumpectomy: Small residual focus of IDC grade 1, 0.2 cm, margins negative, ER 100%, PR 95%, HER-2 negative, Ki-67 10%, T1a N0 stage IA   10/27/2016 - 11/15/2016 Radiation Therapy    Adjuvant radiation with Dr. Sherryll Burger breast: 42.4 Gy in 16 treatments, Right breast boost: 10 Gy in 4 treatments   11/2016 -  Anti-estrogen oral therapy   Anastrozole daily   05/26/2017 Surgery   Breast abscess: Surgery performed     CHIEF COMPLIANT: Follow-up pf right breast cancer on anastrozole  INTERVAL HISTORY: Tabitha [name] is a 65 y.o. with above-mentioned history of right breast cancer treated with lumpectomy, adjuvant radiation, and who is currently on antiestrogen therapy with anastrozole. She presents to the clinic today for annual follow-up.  Her complaint today is multiple ear infections especially on the left side with accompanied by tinnitus.  She has not gotten better with 3 rounds of antibiotics.  She has chronic right leg edema.  She is complaining of the swelling underneath the left axilla.  She is tolerating anastrozole fairly well without any problems or concerns.  ALLERGIES:  is allergic to ampicillin, barium-containing compounds, penicillins, and morphine and related.  MEDICATIONS:  Current Outpatient Medications  Medication Sig Dispense Refill  . acetaminophen (TYLENOL) 325 MG tablet Take 325 mg by mouth daily as needed for moderate pain.     Marland Kitchen alendronate (FOSAMAX) 70 MG tablet Take 70 mg by mouth every Monday. Take with a full glass of water on an empty stomach.     . ALPRAZolam (XANAX) 0.5 MG tablet Take 0.5 mg by mouth at bedtime as needed for anxiety or sleep.     Marland Kitchen amLODipine (NORVASC) 5 MG tablet Take 5 mg by mouth daily.    Marland Kitchen anastrozole (ARIMIDEX) 1 MG tablet TAKE 1 TABLET BY MOUTH DAILY 90 tablet 0  . atorvastatin (LIPITOR) 20 MG tablet Take 20 mg by  mouth every evening.     Marland Kitchen lisinopril (PRINIVIL,ZESTRIL) 40 MG tablet Take 40 mg by mouth daily.    . metFORMIN (GLUCOPHAGE) 500 MG tablet Take 500 mg by mouth 2 (two) times daily.     . Oxycodone HCl 10 MG TABS Take 10 mg by mouth 3 (three) times daily.     . Pancrelipase, Lip-Prot-Amyl, (CREON)  24000-76000 units CPEP Take 2 capsules by mouth 3 (three) times daily before meals.     . pantoprazole (PROTONIX) 40 MG tablet Take 1 tablet (40 mg total) by mouth daily before breakfast. 30 tablet 5  . triamterene-hydrochlorothiazide (MAXZIDE) 75-50 MG tablet Take 1 tablet by mouth daily.     No current facility-administered medications for this visit.    PHYSICAL EXAMINATION: ECOG PERFORMANCE STATUS: 1 - Symptomatic but completely ambulatory  Vitals:   06/09/20 0907  BP: (!) 150/78  Pulse: 73  Resp: 18  Temp: 97.9 F (36.6 C)  SpO2: 100%   Filed Weights   06/09/20 0907  Weight: 185 lb 3.2 oz (84 kg)    BREAST: Scar tissue from prior surgery but otherwise no palpable lumps or nodules of concern.. (exam performed in the presence of a chaperone)  LABORATORY DATA:  I have reviewed the data as listed CMP Latest Ref Rng & Units 05/07/2019 06/04/2017 06/02/2017  Glucose 65 - 139 mg/dL 112 143(H) 130(H)  BUN 7 - 25 mg/dL '10 18 9  ' Creatinine 0.50 - 0.99 mg/dL 0.71 1.01(H) 0.75  Sodium 135 - 146 mmol/L 140 136 138  Potassium 3.5 - 5.3 mmol/L 4.2 4.3 4.2  Chloride 98 - 110 mmol/L 105 105 103  CO2 20 - 32 mmol/L '29 23 24  ' Calcium 8.6 - 10.4 mg/dL 9.9 9.4 9.2  Total Protein 6.1 - 8.1 g/dL 6.8 - 7.1  Total Bilirubin 0.2 - 1.2 mg/dL 0.6 - 0.7  Alkaline Phos 38 - 126 U/L - - 69  AST 10 - 35 U/L 19 - 19  ALT 6 - 29 U/L 20 - 18    Lab Results  Component Value Date   WBC 7.7 05/07/2019   HGB 15.0 05/07/2019   HCT 44.3 05/07/2019   MCV 86.4 05/07/2019   PLT 250 05/07/2019   NEUTROABS 5.9 06/15/2016    ASSESSMENT & PLAN:  Malignant neoplasm of lower-outer quadrant of right breast of female, estrogen receptor positive ([place]) 08/08/2018Right lumpectomy: Small residual focus of IDC grade 1, 0.2 cm, margins negative, ER 100%, PR 95%, HER-2 negative, Ki-67 10%, T1a N0 stage IA (07/05/2016: Breast conserving surgery: No tumor detected on the pathological evaluation.) Adjuvant radiation  therapy done in [place] Longstanding nonhealing wound right breast  Current treatment: Adjuvant antiestrogen therapy with anastrozole 1 mg daily started October 2018  Anastrozole toxicities:Occasional hot flashes denies any myalgias  Hospitalization February 2021: Pancreatitis.  She has had a few episodes of this before.  Breast cancer surveillance: 1.Breast exam: I did not feel any palpable lymphadenopathy.   2.MammogramAugust 2021: Benign   Patient gets mammograms at the [facility] in [place].   Earache and tinnitus: Having received 3 antibiotics she is not better.  I discussed with her about seeing an ENT doctor.  She will discuss this with her PCP.  eturn to clinic in 1 year for follow-up    No orders of the defined types were placed in this encounter.  The patient has a good understanding of the overall plan. she agrees with it. she will call with any problems that may develop  before the next visit here.  Total time spent: 20 mins including face to face time and time spent for planning, charting and coordination of care  Rulon Eisenmenger, MD, MPH 06/09/2020  I, Cloyde Reams Dorshimer, am acting as scribe for Dr. Nicholas Lose.  I have reviewed the above documentation for accuracy and completeness, and I agree with the above.

## 2020-06-09 ENCOUNTER — Other Ambulatory Visit: Payer: Self-pay

## 2020-06-09 ENCOUNTER — Inpatient Hospital Stay: Payer: PPO | Attending: Hematology and Oncology | Admitting: Hematology and Oncology

## 2020-06-09 DIAGNOSIS — Z170 Estrogen receptor positive status [ER+]: Secondary | ICD-10-CM | POA: Insufficient documentation

## 2020-06-09 DIAGNOSIS — C50511 Malignant neoplasm of lower-outer quadrant of right female breast: Secondary | ICD-10-CM | POA: Diagnosis not present

## 2020-06-09 DIAGNOSIS — H9202 Otalgia, left ear: Secondary | ICD-10-CM | POA: Diagnosis not present

## 2020-06-09 DIAGNOSIS — Z923 Personal history of irradiation: Secondary | ICD-10-CM | POA: Insufficient documentation

## 2020-06-09 DIAGNOSIS — H9312 Tinnitus, left ear: Secondary | ICD-10-CM | POA: Diagnosis not present

## 2020-06-09 MED ORDER — ANASTROZOLE 1 MG PO TABS: 1.0000 mg | ORAL_TABLET | Freq: Every day | ORAL | 4 refills | Status: AC

## 2020-06-15 DIAGNOSIS — Z170 Estrogen receptor positive status [ER+]: Secondary | ICD-10-CM | POA: Diagnosis not present

## 2020-06-15 DIAGNOSIS — C50511 Malignant neoplasm of lower-outer quadrant of right female breast: Secondary | ICD-10-CM | POA: Diagnosis not present

## 2020-06-24 DIAGNOSIS — R1084 Generalized abdominal pain: Secondary | ICD-10-CM | POA: Diagnosis not present

## 2020-06-24 DIAGNOSIS — E1143 Type 2 diabetes mellitus with diabetic autonomic (poly)neuropathy: Secondary | ICD-10-CM | POA: Diagnosis not present

## 2020-06-24 DIAGNOSIS — E7849 Other hyperlipidemia: Secondary | ICD-10-CM | POA: Diagnosis not present

## 2020-06-24 DIAGNOSIS — H6691 Otitis media, unspecified, right ear: Secondary | ICD-10-CM | POA: Diagnosis not present

## 2020-06-24 DIAGNOSIS — Z6832 Body mass index (BMI) 32.0-32.9, adult: Secondary | ICD-10-CM | POA: Diagnosis not present

## 2020-06-24 DIAGNOSIS — I10 Essential (primary) hypertension: Secondary | ICD-10-CM | POA: Diagnosis not present

## 2020-06-24 DIAGNOSIS — J305 Allergic rhinitis due to food: Secondary | ICD-10-CM | POA: Diagnosis not present

## 2020-08-24 DIAGNOSIS — E1143 Type 2 diabetes mellitus with diabetic autonomic (poly)neuropathy: Secondary | ICD-10-CM | POA: Diagnosis not present

## 2020-08-24 DIAGNOSIS — I10 Essential (primary) hypertension: Secondary | ICD-10-CM | POA: Diagnosis not present

## 2020-08-24 DIAGNOSIS — Z0000 Encounter for general adult medical examination without abnormal findings: Secondary | ICD-10-CM | POA: Diagnosis not present

## 2020-08-24 DIAGNOSIS — E7849 Other hyperlipidemia: Secondary | ICD-10-CM | POA: Diagnosis not present

## 2020-08-24 DIAGNOSIS — R1084 Generalized abdominal pain: Secondary | ICD-10-CM | POA: Diagnosis not present

## 2020-08-24 DIAGNOSIS — Z6832 Body mass index (BMI) 32.0-32.9, adult: Secondary | ICD-10-CM | POA: Diagnosis not present

## 2020-09-03 DIAGNOSIS — H353191 Nonexudative age-related macular degeneration, unspecified eye, early dry stage: Secondary | ICD-10-CM | POA: Diagnosis not present

## 2020-09-03 DIAGNOSIS — H2513 Age-related nuclear cataract, bilateral: Secondary | ICD-10-CM | POA: Diagnosis not present

## 2020-09-03 DIAGNOSIS — H40053 Ocular hypertension, bilateral: Secondary | ICD-10-CM | POA: Diagnosis not present

## 2020-09-03 DIAGNOSIS — E119 Type 2 diabetes mellitus without complications: Secondary | ICD-10-CM | POA: Diagnosis not present

## 2020-09-03 DIAGNOSIS — H35073 Retinal telangiectasis, bilateral: Secondary | ICD-10-CM | POA: Diagnosis not present

## 2020-09-17 DIAGNOSIS — Z6833 Body mass index (BMI) 33.0-33.9, adult: Secondary | ICD-10-CM | POA: Diagnosis not present

## 2020-09-17 DIAGNOSIS — M545 Low back pain, unspecified: Secondary | ICD-10-CM | POA: Diagnosis not present

## 2020-10-26 DIAGNOSIS — I10 Essential (primary) hypertension: Secondary | ICD-10-CM | POA: Diagnosis not present

## 2020-10-26 DIAGNOSIS — E7849 Other hyperlipidemia: Secondary | ICD-10-CM | POA: Diagnosis not present

## 2020-10-26 DIAGNOSIS — Z6832 Body mass index (BMI) 32.0-32.9, adult: Secondary | ICD-10-CM | POA: Diagnosis not present

## 2020-10-26 DIAGNOSIS — I700 Atherosclerosis of aorta: Secondary | ICD-10-CM | POA: Diagnosis not present

## 2020-10-26 DIAGNOSIS — E119 Type 2 diabetes mellitus without complications: Secondary | ICD-10-CM | POA: Diagnosis not present

## 2020-10-26 DIAGNOSIS — M545 Low back pain, unspecified: Secondary | ICD-10-CM | POA: Diagnosis not present

## 2020-10-27 ENCOUNTER — Encounter: Payer: Self-pay | Admitting: Genetic Counselor

## 2020-10-27 NOTE — Progress Notes (Signed)
UPDATE: DICER1 c.1468C>T (p.Arg490Cys) VUS was reclassified as "Likely Benign". The report date is 10/26/2020.

## 2020-10-29 DIAGNOSIS — H35342 Macular cyst, hole, or pseudohole, left eye: Secondary | ICD-10-CM | POA: Diagnosis not present

## 2020-10-29 DIAGNOSIS — H31013 Macula scars of posterior pole (postinflammatory) (post-traumatic), bilateral: Secondary | ICD-10-CM | POA: Diagnosis not present

## 2020-10-29 DIAGNOSIS — H25813 Combined forms of age-related cataract, bilateral: Secondary | ICD-10-CM | POA: Diagnosis not present

## 2020-10-29 DIAGNOSIS — H35363 Drusen (degenerative) of macula, bilateral: Secondary | ICD-10-CM | POA: Diagnosis not present

## 2020-10-29 DIAGNOSIS — H53413 Scotoma involving central area, bilateral: Secondary | ICD-10-CM | POA: Diagnosis not present

## 2020-12-16 DIAGNOSIS — C50511 Malignant neoplasm of lower-outer quadrant of right female breast: Secondary | ICD-10-CM | POA: Diagnosis not present

## 2020-12-16 DIAGNOSIS — Z170 Estrogen receptor positive status [ER+]: Secondary | ICD-10-CM | POA: Diagnosis not present

## 2020-12-30 DIAGNOSIS — I700 Atherosclerosis of aorta: Secondary | ICD-10-CM | POA: Diagnosis not present

## 2020-12-30 DIAGNOSIS — E7849 Other hyperlipidemia: Secondary | ICD-10-CM | POA: Diagnosis not present

## 2020-12-30 DIAGNOSIS — Z6832 Body mass index (BMI) 32.0-32.9, adult: Secondary | ICD-10-CM | POA: Diagnosis not present

## 2020-12-30 DIAGNOSIS — M545 Low back pain, unspecified: Secondary | ICD-10-CM | POA: Diagnosis not present

## 2020-12-30 DIAGNOSIS — E119 Type 2 diabetes mellitus without complications: Secondary | ICD-10-CM | POA: Diagnosis not present

## 2020-12-30 DIAGNOSIS — I10 Essential (primary) hypertension: Secondary | ICD-10-CM | POA: Diagnosis not present

## 2021-01-06 DIAGNOSIS — R922 Inconclusive mammogram: Secondary | ICD-10-CM | POA: Diagnosis not present

## 2021-01-06 DIAGNOSIS — Z170 Estrogen receptor positive status [ER+]: Secondary | ICD-10-CM | POA: Diagnosis not present

## 2021-01-06 DIAGNOSIS — Z9889 Other specified postprocedural states: Secondary | ICD-10-CM | POA: Diagnosis not present

## 2021-01-06 DIAGNOSIS — C50511 Malignant neoplasm of lower-outer quadrant of right female breast: Secondary | ICD-10-CM | POA: Diagnosis not present

## 2021-03-03 DIAGNOSIS — M545 Low back pain, unspecified: Secondary | ICD-10-CM | POA: Diagnosis not present

## 2021-03-03 DIAGNOSIS — E119 Type 2 diabetes mellitus without complications: Secondary | ICD-10-CM | POA: Diagnosis not present

## 2021-03-03 DIAGNOSIS — I10 Essential (primary) hypertension: Secondary | ICD-10-CM | POA: Diagnosis not present

## 2021-03-03 DIAGNOSIS — E7849 Other hyperlipidemia: Secondary | ICD-10-CM | POA: Diagnosis not present

## 2021-03-03 DIAGNOSIS — Z6833 Body mass index (BMI) 33.0-33.9, adult: Secondary | ICD-10-CM | POA: Diagnosis not present

## 2021-03-03 DIAGNOSIS — I700 Atherosclerosis of aorta: Secondary | ICD-10-CM | POA: Diagnosis not present

## 2021-04-01 DIAGNOSIS — I10 Essential (primary) hypertension: Secondary | ICD-10-CM | POA: Diagnosis not present

## 2021-04-01 DIAGNOSIS — M5416 Radiculopathy, lumbar region: Secondary | ICD-10-CM | POA: Diagnosis not present

## 2021-04-01 DIAGNOSIS — M4310 Spondylolisthesis, site unspecified: Secondary | ICD-10-CM | POA: Diagnosis not present

## 2021-04-01 DIAGNOSIS — Z6832 Body mass index (BMI) 32.0-32.9, adult: Secondary | ICD-10-CM | POA: Diagnosis not present

## 2021-04-07 DIAGNOSIS — H40053 Ocular hypertension, bilateral: Secondary | ICD-10-CM | POA: Diagnosis not present

## 2021-04-08 DIAGNOSIS — M5416 Radiculopathy, lumbar region: Secondary | ICD-10-CM | POA: Diagnosis not present

## 2021-04-08 DIAGNOSIS — M4316 Spondylolisthesis, lumbar region: Secondary | ICD-10-CM | POA: Diagnosis not present

## 2021-04-08 DIAGNOSIS — M5126 Other intervertebral disc displacement, lumbar region: Secondary | ICD-10-CM | POA: Diagnosis not present

## 2021-04-14 DIAGNOSIS — I10 Essential (primary) hypertension: Secondary | ICD-10-CM | POA: Diagnosis not present

## 2021-04-14 DIAGNOSIS — M5414 Radiculopathy, thoracic region: Secondary | ICD-10-CM | POA: Diagnosis not present

## 2021-04-14 DIAGNOSIS — Z6832 Body mass index (BMI) 32.0-32.9, adult: Secondary | ICD-10-CM | POA: Diagnosis not present

## 2021-04-28 DIAGNOSIS — M4724 Other spondylosis with radiculopathy, thoracic region: Secondary | ICD-10-CM | POA: Diagnosis not present

## 2021-04-28 DIAGNOSIS — M5414 Radiculopathy, thoracic region: Secondary | ICD-10-CM | POA: Diagnosis not present

## 2021-04-29 DIAGNOSIS — I10 Essential (primary) hypertension: Secondary | ICD-10-CM | POA: Diagnosis not present

## 2021-04-29 DIAGNOSIS — E119 Type 2 diabetes mellitus without complications: Secondary | ICD-10-CM | POA: Diagnosis not present

## 2021-04-29 DIAGNOSIS — Z6834 Body mass index (BMI) 34.0-34.9, adult: Secondary | ICD-10-CM | POA: Diagnosis not present

## 2021-04-29 DIAGNOSIS — M545 Low back pain, unspecified: Secondary | ICD-10-CM | POA: Diagnosis not present

## 2021-04-29 DIAGNOSIS — Z0000 Encounter for general adult medical examination without abnormal findings: Secondary | ICD-10-CM | POA: Diagnosis not present

## 2021-04-29 DIAGNOSIS — E7849 Other hyperlipidemia: Secondary | ICD-10-CM | POA: Diagnosis not present

## 2021-04-29 DIAGNOSIS — I700 Atherosclerosis of aorta: Secondary | ICD-10-CM | POA: Diagnosis not present

## 2021-05-06 DIAGNOSIS — H31093 Other chorioretinal scars, bilateral: Secondary | ICD-10-CM | POA: Diagnosis not present

## 2021-05-06 DIAGNOSIS — H31013 Macula scars of posterior pole (postinflammatory) (post-traumatic), bilateral: Secondary | ICD-10-CM | POA: Diagnosis not present

## 2021-05-06 DIAGNOSIS — E119 Type 2 diabetes mellitus without complications: Secondary | ICD-10-CM | POA: Diagnosis not present

## 2021-05-06 DIAGNOSIS — M4310 Spondylolisthesis, site unspecified: Secondary | ICD-10-CM | POA: Diagnosis not present

## 2021-05-06 DIAGNOSIS — H25813 Combined forms of age-related cataract, bilateral: Secondary | ICD-10-CM | POA: Diagnosis not present

## 2021-05-06 DIAGNOSIS — H35342 Macular cyst, hole, or pseudohole, left eye: Secondary | ICD-10-CM | POA: Diagnosis not present

## 2021-05-06 DIAGNOSIS — H35363 Drusen (degenerative) of macula, bilateral: Secondary | ICD-10-CM | POA: Diagnosis not present

## 2021-05-26 DIAGNOSIS — M5416 Radiculopathy, lumbar region: Secondary | ICD-10-CM | POA: Diagnosis not present

## 2021-05-26 NOTE — Progress Notes (Signed)
? ?Patient Care Team: ?Neale Burly, MD as PCP - General (Internal Medicine) ?Excell Seltzer, MD (Inactive) as Consulting Physician (General Surgery) ?Nicholas Lose, MD as Consulting Physician (Hematology and Oncology) ?Eppie Gibson, MD as Attending Physician (Radiation Oncology) ?Gardenia Phlegm, NP as Nurse Practitioner (Hematology and Oncology) ?Audry Pili, MD as Referring Physician (Radiation Oncology) ? ?DIAGNOSIS:  ?Encounter Diagnoses  ?Name Primary?  ? Malignant neoplasm of lower-outer quadrant of right breast of female, estrogen receptor positive ([place])   ? Idiopathic acute pancreatitis, unspecified complication status Yes  ? ? ?SUMMARY OF ONCOLOGIC HISTORY: ?Oncology History  ?Malignant neoplasm of lower-outer quadrant of right breast of female, estrogen receptor positive ([place])  ?06/08/2016 Initial Diagnosis  ? Right breast asymmetry posteriorly 7 mm size, no ultrasound correlate, AFFIRM biopsy: Grade 2 invasive ductal carcinoma ER 100%, PR 95%, Ki-67 10%, HER-2 negative ratio 1.58, T1 BN 0 stage IA clinical stage ? ?  ?06/15/2016 Genetic Testing  ? Genetic testing did not reveal a deleterious mutation.  Variants of uncertain significance (VUSs) called DICER1 c.1468C>T (p.Arg490Cys) and MSH3 c.2041C>T (p.Pro681Ser) were also noted.  ?Genes tested include: APC, ATM, AXIN2, BARD1, BMPR1A, BRCA1, BRCA2, BRIP1, CDH1, CDKN2A, CHEK2, CTNNA1, DICER1, EPCAM, GREM1, HOXB13, KIT, MEN1, MLH1, MSH2, MSH3, MSH6, MUTYH, NBN, NF1, NTHL1, PALB2, PDGFRA, PMS2, POLD1, POLE, PTEN, RAD50, RAD51C, RAD51D, SDHA, SDHB, SDHC, SDHD, SMAD4, SMARCA4, STK11, TP53, TSC1, TSC2, and VHL. ? ?UPDATE: MSH3 c.2041C>T (p.Pro681Ser) VUS was reclassified as "Likely Benign." The report date is 12/12/2017.  ?UPDATE: DICER1 c.1468C>T (p.Arg490Cys) VUS was reclassified as "Likely Benign". The report date is 10/26/2020. ?  ?07/06/2016 Surgery  ? Right lumpectomy: No tumor noted on the resected specimen ? ?  ?09/14/2016 Surgery  ?  Right lumpectomy: Small residual focus of IDC grade 1, 0.2 cm, margins negative, ER 100%, PR 95%, HER-2 negative, Ki-67 10%, T1a N0 stage IA ? ?  ?10/27/2016 - 11/15/2016 Radiation Therapy  ? Adjuvant radiation with Dr. Sherryll Burger breast: 42.4 Gy in 16 treatments, Right breast boost: 10 Gy in 4 treatments ? ?  ?11/2016 -  Anti-estrogen oral therapy  ? Anastrozole daily ? ?  ?05/26/2017 Surgery  ? Breast abscess: Surgery performed ? ?  ? ? ?CHIEF COMPLIANT: Follow-up pf right breast cancer on anastrozole ? ?INTERVAL HISTORY: Tabitha [name] is a 66 y.o. with above-mentioned history of right breast cancer treated with lumpectomy, adjuvant radiation, and who is currently on antiestrogen therapy with anastrozole. She presents to the clinic today for annual follow-up. She state every now and then she feels some pain in breast. She denies any side effects from the anastrozole. She state that her belly has been hurting really bad.  She has a history of pancreatitis in the past.  She state her urinary tract infections has got better. ? ? ?ALLERGIES:  is allergic to ampicillin, barium-containing compounds, penicillins, and morphine and related. ? ?MEDICATIONS:  ?Current Outpatient Medications  ?Medication Sig Dispense Refill  ? acetaminophen (TYLENOL) 325 MG tablet Take 325 mg by mouth daily as needed for moderate pain.     ? alendronate (FOSAMAX) 70 MG tablet Take 70 mg by mouth every Monday. Take with a full glass of water on an empty stomach.     ? ALPRAZolam (XANAX) 0.5 MG tablet Take 0.5 mg by mouth at bedtime as needed for anxiety or sleep.     ? amLODipine (NORVASC) 5 MG tablet Take 5 mg by mouth daily.    ? anastrozole (ARIMIDEX) 1 MG tablet Take 1  tablet (1 mg total) by mouth daily. 90 tablet 4  ? atorvastatin (LIPITOR) 20 MG tablet Take 20 mg by mouth every evening.     ? lisinopril (PRINIVIL,ZESTRIL) 40 MG tablet Take 40 mg by mouth daily.    ? metFORMIN (GLUCOPHAGE) 500 MG tablet Take 500 mg by mouth 2 (two)  times daily.     ? Oxycodone HCl 10 MG TABS Take 10 mg by mouth 3 (three) times daily.     ? Pancrelipase, Lip-Prot-Amyl, (CREON) 24000-76000 units CPEP Take 2 capsules by mouth 3 (three) times daily before meals.     ? pantoprazole (PROTONIX) 40 MG tablet Take 1 tablet (40 mg total) by mouth daily before breakfast. 30 tablet 5  ? triamterene-hydrochlorothiazide (MAXZIDE) 75-50 MG tablet Take 1 tablet by mouth daily.    ? ?No current facility-administered medications for this visit.  ? ? ?PHYSICAL EXAMINATION: ?ECOG PERFORMANCE STATUS: 1 - Symptomatic but completely ambulatory ? ?Vitals:  ? 06/09/21 0911  ?BP: (!) 170/78  ?Pulse: 78  ?Resp: 19  ?Temp: 97.8 ?F (36.6 ?C)  ?SpO2: 100%  ? ?Filed Weights  ? 06/09/21 0911  ?Weight: 191 lb 14.4 oz (87 kg)  ? ? ?BREAST: No palpable masses or nodules in either right or left breasts. No palpable axillary supraclavicular or infraclavicular adenopathy no breast tenderness or nipple discharge. (exam performed in the presence of a chaperone) ? ?LABORATORY DATA:  ?I have reviewed the data as listed ? ?  Latest Ref Rng & Units 05/07/2019  ?  4:40 PM 06/04/2017  ?  4:32 AM 06/02/2017  ?  9:17 AM  ?CMP  ?Glucose 65 - 139 mg/dL 112   143   130    ?BUN 7 - 25 mg/dL '10   18   9    ' ?Creatinine 0.50 - 0.99 mg/dL 0.71   1.01   0.75    ?Sodium 135 - 146 mmol/L 140   136   138    ?Potassium 3.5 - 5.3 mmol/L 4.2   4.3   4.2    ?Chloride 98 - 110 mmol/L 105   105   103    ?CO2 20 - 32 mmol/L '29   23   24    ' ?Calcium 8.6 - 10.4 mg/dL 9.9   9.4   9.2    ?Total Protein 6.1 - 8.1 g/dL 6.8    7.1    ?Total Bilirubin 0.2 - 1.2 mg/dL 0.6    0.7    ?Alkaline Phos 38 - 126 U/L   69    ?AST 10 - 35 U/L 19    19    ?ALT 6 - 29 U/L 20    18    ? ? ?Lab Results  ?Component Value Date  ? WBC 7.7 05/07/2019  ? HGB 15.0 05/07/2019  ? HCT 44.3 05/07/2019  ? MCV 86.4 05/07/2019  ? PLT 250 05/07/2019  ? NEUTROABS 5.9 06/15/2016  ? ? ?ASSESSMENT & PLAN:  ?Malignant neoplasm of lower-outer quadrant of right breast of  female, estrogen receptor positive ([place]) ?09/14/2016 Right lumpectomy: Small residual focus of IDC grade 1, 0.2 cm, margins negative, ER 100%, PR 95%, HER-2 negative, Ki-67 10%, T1a N0 stage IA ?(07/05/2016: Breast conserving surgery: No tumor detected on the pathological evaluation.) ?Adjuvant radiation therapy done in [place] ?Longstanding nonhealing wound right breast ?  ?Current treatment: Adjuvant antiestrogen therapy with anastrozole 1 mg daily started October 2018 completed June 2023 ?  ?Anastrozole toxicities: Occasional hot flashes denies any  myalgias ?Based on risk-benefit analysis I recommended that she can stop antiestrogen therapy at this time. ? ?Hospitalization February 2021: Pancreatitis.  She has had a few episodes of this before. ?Complaining of abdominal pain epigastric: We will check amylase lipase and CBC CMP today.  I will call her with the results of these tests by this afternoon. ?  ?Breast cancer surveillance: ?1.  Breast exam: 06/09/2021: Benign.   ?2. Mammogram 01/06/2021: Benign, breast density category B ?  ?Patient gets mammograms at the [facility] in [place]. ?   ?  ?Return to clinic in 1 year for follow-up ? ? ? ?Orders Placed This Encounter  ?Procedures  ? CBC with Differential ([place] Only)  ?  Standing Status:   Future  ?  Standing Expiration Date:   06/10/2022  ? CMP ([place] only)  ?  Standing Status:   Future  ?  Standing Expiration Date:   06/10/2022  ? Amylase  ?  Standing Status:   Future  ?  Standing Expiration Date:   06/09/2022  ? Lipase, blood  ?  Standing Status:   Future  ?  Standing Expiration Date:   06/10/2022  ? ?The patient has a good understanding of the overall plan. she agrees with it. she will call with any problems that may develop before the next visit here. ?Total time spent: 30 mins including face to face time and time spent for planning, charting and co-ordination of care ? ? Harriette Ohara, MD ?06/09/21 ? ? ? I Gardiner Coins am scribing for Dr.  Lindi Adie ? ?I have reviewed the above documentation for accuracy and completeness, and I agree with the above. ?  ?

## 2021-06-09 ENCOUNTER — Inpatient Hospital Stay: Payer: PPO

## 2021-06-09 ENCOUNTER — Other Ambulatory Visit: Payer: Self-pay

## 2021-06-09 ENCOUNTER — Inpatient Hospital Stay: Payer: PPO | Attending: Hematology and Oncology | Admitting: Hematology and Oncology

## 2021-06-09 VITALS — BP 170/78 | HR 78 | Temp 97.8°F | Resp 19 | Ht 64.0 in | Wt 191.9 lb

## 2021-06-09 DIAGNOSIS — N951 Menopausal and female climacteric states: Secondary | ICD-10-CM | POA: Diagnosis not present

## 2021-06-09 DIAGNOSIS — C50511 Malignant neoplasm of lower-outer quadrant of right female breast: Secondary | ICD-10-CM | POA: Insufficient documentation

## 2021-06-09 DIAGNOSIS — K8500 Idiopathic acute pancreatitis without necrosis or infection: Secondary | ICD-10-CM

## 2021-06-09 DIAGNOSIS — Z79811 Long term (current) use of aromatase inhibitors: Secondary | ICD-10-CM | POA: Insufficient documentation

## 2021-06-09 DIAGNOSIS — Z170 Estrogen receptor positive status [ER+]: Secondary | ICD-10-CM | POA: Diagnosis not present

## 2021-06-09 LAB — CBC WITH DIFFERENTIAL (CANCER CENTER ONLY)
Abs Immature Granulocytes: 0.02 10*3/uL (ref 0.00–0.07)
Basophils Absolute: 0.1 10*3/uL (ref 0.0–0.1)
Basophils Relative: 1 %
Eosinophils Absolute: 0.2 10*3/uL (ref 0.0–0.5)
Eosinophils Relative: 3 %
HCT: 41.4 % (ref 36.0–46.0)
Hemoglobin: 14 g/dL (ref 12.0–15.0)
Immature Granulocytes: 0 %
Lymphocytes Relative: 27 %
Lymphs Abs: 2.1 10*3/uL (ref 0.7–4.0)
MCH: 29.3 pg (ref 26.0–34.0)
MCHC: 33.8 g/dL (ref 30.0–36.0)
MCV: 86.6 fL (ref 80.0–100.0)
Monocytes Absolute: 0.5 10*3/uL (ref 0.1–1.0)
Monocytes Relative: 6 %
Neutro Abs: 4.9 10*3/uL (ref 1.7–7.7)
Neutrophils Relative %: 63 %
Platelet Count: 242 10*3/uL (ref 150–400)
RBC: 4.78 MIL/uL (ref 3.87–5.11)
RDW: 12.7 % (ref 11.5–15.5)
WBC Count: 7.8 10*3/uL (ref 4.0–10.5)
nRBC: 0 % (ref 0.0–0.2)

## 2021-06-09 LAB — CMP (CANCER CENTER ONLY)
ALT: 37 U/L (ref 0–44)
AST: 31 U/L (ref 15–41)
Albumin: 4.3 g/dL (ref 3.5–5.0)
Alkaline Phosphatase: 62 U/L (ref 38–126)
Anion gap: 8 (ref 5–15)
BUN: 8 mg/dL (ref 8–23)
CO2: 24 mmol/L (ref 22–32)
Calcium: 9.2 mg/dL (ref 8.9–10.3)
Chloride: 104 mmol/L (ref 98–111)
Creatinine: 0.79 mg/dL (ref 0.44–1.00)
GFR, Estimated: >60 mL/min (ref >60)
Glucose, Bld: 132 mg/dL — ABNORMAL HIGH (ref 70–99)
Potassium: 3.8 mmol/L (ref 3.5–5.1)
Sodium: 136 mmol/L (ref 135–145)
Total Bilirubin: 0.7 mg/dL (ref 0.3–1.2)
Total Protein: 7.2 g/dL (ref 6.5–8.1)

## 2021-06-09 LAB — LIPASE, BLOOD: Lipase: 36 U/L (ref 11–51)

## 2021-06-09 LAB — AMYLASE: Amylase: 89 U/L (ref 28–100)

## 2021-06-09 NOTE — Assessment & Plan Note (Addendum)
09/14/2016?Right lumpectomy: Small residual focus of IDC grade 1, 0.2 cm, margins negative, ER 100%, PR 95%, HER-2 negative, Ki-67 10%, T1a N0 stage IA ?(07/05/2016: Breast conserving surgery: No tumor detected on the pathological evaluation.) ?Adjuvant radiation therapy done in [place] ?Longstanding nonhealing wound right breast ?? ?Current treatment: Adjuvant antiestrogen therapy with anastrozole 1 mg daily started October 2018 completed June 2023 ?? ?Anastrozole toxicities:?Occasional hot flashes denies any myalgias ?Based on risk-benefit analysis I recommended that she can stop antiestrogen therapy at this time. ? ?Hospitalization February 2021: Pancreatitis. ?She has had a few episodes of this before. ?Complaining of abdominal pain epigastric: We will check amylase lipase and CBC CMP today.  I will call her with the results of these tests by this afternoon. ?? ?Breast cancer surveillance: ?1.??Breast exam: 06/09/2021: Benign.   ?2.?Mammogram?01/06/2021: Benign, breast density category B ?  ?Patient gets mammograms at the?[facility] in [place]. ?   ?? ?Return to clinic in 1 year for follow-up ?

## 2021-06-16 DIAGNOSIS — C50511 Malignant neoplasm of lower-outer quadrant of right female breast: Secondary | ICD-10-CM | POA: Diagnosis not present

## 2021-06-16 DIAGNOSIS — Z170 Estrogen receptor positive status [ER+]: Secondary | ICD-10-CM | POA: Diagnosis not present

## 2021-07-01 DIAGNOSIS — I700 Atherosclerosis of aorta: Secondary | ICD-10-CM | POA: Diagnosis not present

## 2021-07-01 DIAGNOSIS — I10 Essential (primary) hypertension: Secondary | ICD-10-CM | POA: Diagnosis not present

## 2021-07-01 DIAGNOSIS — E7849 Other hyperlipidemia: Secondary | ICD-10-CM | POA: Diagnosis not present

## 2021-07-01 DIAGNOSIS — M545 Low back pain, unspecified: Secondary | ICD-10-CM | POA: Diagnosis not present

## 2021-07-01 DIAGNOSIS — E119 Type 2 diabetes mellitus without complications: Secondary | ICD-10-CM | POA: Diagnosis not present

## 2021-07-01 DIAGNOSIS — Z6833 Body mass index (BMI) 33.0-33.9, adult: Secondary | ICD-10-CM | POA: Diagnosis not present

## 2021-07-02 DIAGNOSIS — E7849 Other hyperlipidemia: Secondary | ICD-10-CM | POA: Diagnosis not present

## 2021-07-02 DIAGNOSIS — E119 Type 2 diabetes mellitus without complications: Secondary | ICD-10-CM | POA: Diagnosis not present

## 2021-07-02 DIAGNOSIS — I10 Essential (primary) hypertension: Secondary | ICD-10-CM | POA: Diagnosis not present

## 2021-07-02 DIAGNOSIS — I700 Atherosclerosis of aorta: Secondary | ICD-10-CM | POA: Diagnosis not present

## 2021-07-29 ENCOUNTER — Other Ambulatory Visit: Payer: Self-pay | Admitting: Hematology and Oncology

## 2021-07-29 NOTE — Telephone Encounter (Signed)
Pt requested refill for Anastrozole.  Dr. Geralyn Flash last note said Anastrozole therapy completed June 2023. No refill needed.

## 2021-09-01 DIAGNOSIS — E7849 Other hyperlipidemia: Secondary | ICD-10-CM | POA: Diagnosis not present

## 2021-09-01 DIAGNOSIS — I10 Essential (primary) hypertension: Secondary | ICD-10-CM | POA: Diagnosis not present

## 2021-09-01 DIAGNOSIS — Z6832 Body mass index (BMI) 32.0-32.9, adult: Secondary | ICD-10-CM | POA: Diagnosis not present

## 2021-09-01 DIAGNOSIS — Z0000 Encounter for general adult medical examination without abnormal findings: Secondary | ICD-10-CM | POA: Diagnosis not present

## 2021-09-01 DIAGNOSIS — E119 Type 2 diabetes mellitus without complications: Secondary | ICD-10-CM | POA: Diagnosis not present

## 2021-09-01 DIAGNOSIS — F411 Generalized anxiety disorder: Secondary | ICD-10-CM | POA: Diagnosis not present

## 2021-09-01 DIAGNOSIS — I700 Atherosclerosis of aorta: Secondary | ICD-10-CM | POA: Diagnosis not present

## 2021-09-01 DIAGNOSIS — M545 Low back pain, unspecified: Secondary | ICD-10-CM | POA: Diagnosis not present

## 2021-09-02 DIAGNOSIS — M8589 Other specified disorders of bone density and structure, multiple sites: Secondary | ICD-10-CM | POA: Diagnosis not present

## 2021-09-02 DIAGNOSIS — M810 Age-related osteoporosis without current pathological fracture: Secondary | ICD-10-CM | POA: Diagnosis not present

## 2021-10-05 DIAGNOSIS — Z6832 Body mass index (BMI) 32.0-32.9, adult: Secondary | ICD-10-CM | POA: Diagnosis not present

## 2021-10-05 DIAGNOSIS — L309 Dermatitis, unspecified: Secondary | ICD-10-CM | POA: Diagnosis not present

## 2021-10-07 DIAGNOSIS — E119 Type 2 diabetes mellitus without complications: Secondary | ICD-10-CM | POA: Diagnosis not present

## 2021-10-07 DIAGNOSIS — H353191 Nonexudative age-related macular degeneration, unspecified eye, early dry stage: Secondary | ICD-10-CM | POA: Diagnosis not present

## 2021-10-07 DIAGNOSIS — H40053 Ocular hypertension, bilateral: Secondary | ICD-10-CM | POA: Diagnosis not present

## 2021-10-07 DIAGNOSIS — H5203 Hypermetropia, bilateral: Secondary | ICD-10-CM | POA: Diagnosis not present

## 2021-11-04 DIAGNOSIS — M5418 Radiculopathy, sacral and sacrococcygeal region: Secondary | ICD-10-CM | POA: Diagnosis not present

## 2021-11-04 DIAGNOSIS — Z6832 Body mass index (BMI) 32.0-32.9, adult: Secondary | ICD-10-CM | POA: Diagnosis not present

## 2022-01-13 DIAGNOSIS — E7849 Other hyperlipidemia: Secondary | ICD-10-CM | POA: Diagnosis not present

## 2022-01-13 DIAGNOSIS — I10 Essential (primary) hypertension: Secondary | ICD-10-CM | POA: Diagnosis not present

## 2022-01-13 DIAGNOSIS — I700 Atherosclerosis of aorta: Secondary | ICD-10-CM | POA: Diagnosis not present

## 2022-01-13 DIAGNOSIS — M5418 Radiculopathy, sacral and sacrococcygeal region: Secondary | ICD-10-CM | POA: Diagnosis not present

## 2022-01-13 DIAGNOSIS — E1165 Type 2 diabetes mellitus with hyperglycemia: Secondary | ICD-10-CM | POA: Diagnosis not present

## 2022-01-13 DIAGNOSIS — Z6832 Body mass index (BMI) 32.0-32.9, adult: Secondary | ICD-10-CM | POA: Diagnosis not present

## 2022-03-24 DIAGNOSIS — E1165 Type 2 diabetes mellitus with hyperglycemia: Secondary | ICD-10-CM | POA: Diagnosis not present

## 2022-03-24 DIAGNOSIS — Z6832 Body mass index (BMI) 32.0-32.9, adult: Secondary | ICD-10-CM | POA: Diagnosis not present

## 2022-03-24 DIAGNOSIS — Z1231 Encounter for screening mammogram for malignant neoplasm of breast: Secondary | ICD-10-CM | POA: Diagnosis not present

## 2022-03-24 DIAGNOSIS — I700 Atherosclerosis of aorta: Secondary | ICD-10-CM | POA: Diagnosis not present

## 2022-03-24 DIAGNOSIS — E7849 Other hyperlipidemia: Secondary | ICD-10-CM | POA: Diagnosis not present

## 2022-03-24 DIAGNOSIS — Z0000 Encounter for general adult medical examination without abnormal findings: Secondary | ICD-10-CM | POA: Diagnosis not present

## 2022-03-24 DIAGNOSIS — I10 Essential (primary) hypertension: Secondary | ICD-10-CM | POA: Diagnosis not present

## 2022-03-24 DIAGNOSIS — F3342 Major depressive disorder, recurrent, in full remission: Secondary | ICD-10-CM | POA: Diagnosis not present

## 2022-03-31 DIAGNOSIS — Z9049 Acquired absence of other specified parts of digestive tract: Secondary | ICD-10-CM | POA: Diagnosis not present

## 2022-03-31 DIAGNOSIS — K769 Liver disease, unspecified: Secondary | ICD-10-CM | POA: Diagnosis not present

## 2022-03-31 DIAGNOSIS — K760 Fatty (change of) liver, not elsewhere classified: Secondary | ICD-10-CM | POA: Diagnosis not present

## 2022-05-19 DIAGNOSIS — H35363 Drusen (degenerative) of macula, bilateral: Secondary | ICD-10-CM | POA: Diagnosis not present

## 2022-05-19 DIAGNOSIS — H31013 Macula scars of posterior pole (postinflammatory) (post-traumatic), bilateral: Secondary | ICD-10-CM | POA: Diagnosis not present

## 2022-05-19 DIAGNOSIS — H25813 Combined forms of age-related cataract, bilateral: Secondary | ICD-10-CM | POA: Diagnosis not present

## 2022-05-19 DIAGNOSIS — H31093 Other chorioretinal scars, bilateral: Secondary | ICD-10-CM | POA: Diagnosis not present

## 2022-05-19 DIAGNOSIS — H35033 Hypertensive retinopathy, bilateral: Secondary | ICD-10-CM | POA: Diagnosis not present

## 2022-05-19 DIAGNOSIS — H02825 Cysts of left lower eyelid: Secondary | ICD-10-CM | POA: Diagnosis not present

## 2022-05-26 DIAGNOSIS — E7849 Other hyperlipidemia: Secondary | ICD-10-CM | POA: Diagnosis not present

## 2022-05-26 DIAGNOSIS — I10 Essential (primary) hypertension: Secondary | ICD-10-CM | POA: Diagnosis not present

## 2022-05-26 DIAGNOSIS — F330 Major depressive disorder, recurrent, mild: Secondary | ICD-10-CM | POA: Diagnosis not present

## 2022-05-26 DIAGNOSIS — Z0000 Encounter for general adult medical examination without abnormal findings: Secondary | ICD-10-CM | POA: Diagnosis not present

## 2022-05-26 DIAGNOSIS — E11649 Type 2 diabetes mellitus with hypoglycemia without coma: Secondary | ICD-10-CM | POA: Diagnosis not present

## 2022-05-26 DIAGNOSIS — Z6831 Body mass index (BMI) 31.0-31.9, adult: Secondary | ICD-10-CM | POA: Diagnosis not present

## 2022-05-26 DIAGNOSIS — I700 Atherosclerosis of aorta: Secondary | ICD-10-CM | POA: Diagnosis not present

## 2022-05-30 DIAGNOSIS — C50511 Malignant neoplasm of lower-outer quadrant of right female breast: Secondary | ICD-10-CM | POA: Diagnosis not present

## 2022-05-30 DIAGNOSIS — Z170 Estrogen receptor positive status [ER+]: Secondary | ICD-10-CM | POA: Diagnosis not present

## 2022-06-10 ENCOUNTER — Telehealth: Payer: Self-pay | Admitting: Hematology and Oncology

## 2022-06-10 NOTE — Telephone Encounter (Signed)
Patient called to reschedule f/u due to conflict in scheduling with spouse's tx. Patient requested dates be pushed. Patient rescheduled and notified.

## 2022-06-13 ENCOUNTER — Inpatient Hospital Stay: Payer: PPO

## 2022-06-13 ENCOUNTER — Inpatient Hospital Stay: Payer: PPO | Admitting: Hematology and Oncology

## 2022-06-16 ENCOUNTER — Telehealth: Payer: Self-pay | Admitting: Hematology and Oncology

## 2022-06-28 NOTE — Progress Notes (Signed)
Patient Care Team: Toma Deiters, MD as PCP - General (Internal Medicine) Glenna Fellows, MD (Inactive) as Consulting Physician (General Surgery) Serena Croissant, MD as Consulting Physician (Hematology and Oncology) Lonie Peak, MD as Attending Physician (Radiation Oncology) Loa Socks, NP as Nurse Practitioner (Hematology and Oncology) Audery Amel, MD as Referring Physician (Radiation Oncology)  DIAGNOSIS: No diagnosis found.  SUMMARY OF ONCOLOGIC HISTORY: Oncology History  Malignant neoplasm of lower-outer quadrant of right breast of female, estrogen receptor positive ([facility])  06/08/2016 Initial Diagnosis   Right breast asymmetry posteriorly 7 mm size, no ultrasound correlate, AFFIRM biopsy: Grade 2 invasive ductal carcinoma ER 100%, PR 95%, Ki-67 10%, HER-2 negative ratio 1.58, T1 BN 0 stage IA clinical stage   06/15/2016 Genetic Testing   Genetic testing did not reveal a deleterious mutation.  Variants of uncertain significance (VUSs) called DICER1 c.1468C>T (p.Arg490Cys) and MSH3 c.2041C>T (p.Pro681Ser) were also noted.  Genes tested include: APC, ATM, AXIN2, BARD1, BMPR1A, BRCA1, BRCA2, BRIP1, CDH1, CDKN2A, CHEK2, CTNNA1, DICER1, EPCAM, GREM1, HOXB13, KIT, MEN1, MLH1, MSH2, MSH3, MSH6, MUTYH, NBN, NF1, NTHL1, PALB2, PDGFRA, PMS2, POLD1, POLE, PTEN, RAD50, RAD51C, RAD51D, SDHA, SDHB, SDHC, SDHD, SMAD4, SMARCA4, STK11, TP53, TSC1, TSC2, and VHL.  UPDATE: MSH3 c.2041C>T (p.Pro681Ser) VUS was reclassified as "Likely Benign." The report date is 12/12/2017.  UPDATE: DICER1 c.1468C>T (p.Arg490Cys) VUS was reclassified as "Likely Benign". The report date is 10/26/2020.   07/06/2016 Surgery   Right lumpectomy: No tumor noted on the resected specimen   09/14/2016 Surgery   Right lumpectomy: Small residual focus of IDC grade 1, 0.2 cm, margins negative, ER 100%, PR 95%, HER-2 negative, Ki-67 10%, T1a N0 stage IA   10/27/2016 - 11/15/2016 Radiation Therapy   Adjuvant  radiation with Dr. Assunta Curtis breast: 42.4 Gy in 16 treatments, Right breast boost: 10 Gy in 4 treatments   11/2016 -  Anti-estrogen oral therapy   Anastrozole daily   05/26/2017 Surgery   Breast abscess: Surgery performed     CHIEF COMPLIANT: Follow-up pf right breast cancer on anastrozole   INTERVAL HISTORY: Tabitha [name] is a 67 y.o. with above-mentioned history of right breast cancer treated with lumpectomy, adjuvant radiation, and who is currently on antiestrogen therapy with anastrozole. She presents to the clinic today for annual follow-up.    ALLERGIES:  is allergic to ampicillin, barium-containing compounds, penicillins, and morphine and codeine.  MEDICATIONS:  Current Outpatient Medications  Medication Sig Dispense Refill   acetaminophen (TYLENOL) 325 MG tablet Take 325 mg by mouth daily as needed for moderate pain.      alendronate (FOSAMAX) 70 MG tablet Take 70 mg by mouth every Monday. Take with a full glass of water on an empty stomach.      ALPRAZolam (XANAX) 0.5 MG tablet Take 0.5 mg by mouth at bedtime as needed for anxiety or sleep.      amLODipine (NORVASC) 5 MG tablet Take 5 mg by mouth daily.     anastrozole (ARIMIDEX) 1 MG tablet Take 1 tablet (1 mg total) by mouth daily. 90 tablet 4   atorvastatin (LIPITOR) 20 MG tablet Take 20 mg by mouth every evening.      lisinopril (PRINIVIL,ZESTRIL) 40 MG tablet Take 40 mg by mouth daily.     metFORMIN (GLUCOPHAGE) 500 MG tablet Take 500 mg by mouth 2 (two) times daily.      Oxycodone HCl 10 MG TABS Take 10 mg by mouth 3 (three) times daily.      Pancrelipase, Lip-Prot-Amyl, (CREON)  24000-76000 units CPEP Take 2 capsules by mouth 3 (three) times daily before meals.      pantoprazole (PROTONIX) 40 MG tablet Take 1 tablet (40 mg total) by mouth daily before breakfast. 30 tablet 5   triamterene-hydrochlorothiazide (MAXZIDE) 75-50 MG tablet Take 1 tablet by mouth daily.     No current facility-administered medications  for this visit.    PHYSICAL EXAMINATION: ECOG PERFORMANCE STATUS: {CHL ONC ECOG PS:[phone]}  There were no vitals filed for this visit. There were no vitals filed for this visit.  BREAST:*** No palpable masses or nodules in either right or left breasts. No palpable axillary supraclavicular or infraclavicular adenopathy no breast tenderness or nipple discharge. (exam performed in the presence of a chaperone)  LABORATORY DATA:  I have reviewed the data as listed    Latest Ref Rng & Units 06/09/2021    9:28 AM 05/07/2019    4:40 PM 06/04/2017    4:32 AM  CMP  Glucose 70 - 99 mg/dL 324  401  027   BUN 8 - 23 mg/dL 8  10  18    Creatinine 0.44 - 1.00 mg/dL 2.53  6.64  4.03   Sodium 135 - 145 mmol/L 136  140  136   Potassium 3.5 - 5.1 mmol/L 3.8  4.2  4.3   Chloride 98 - 111 mmol/L 104  105  105   CO2 22 - 32 mmol/L 24  29  23    Calcium 8.9 - 10.3 mg/dL 9.2  9.9  9.4   Total Protein 6.5 - 8.1 g/dL 7.2  6.8    Total Bilirubin 0.3 - 1.2 mg/dL 0.7  0.6    Alkaline Phos 38 - 126 U/L 62     AST 15 - 41 U/L 31  19    ALT 0 - 44 U/L 37  20      Lab Results  Component Value Date   WBC 7.8 06/09/2021   HGB 14.0 06/09/2021   HCT 41.4 06/09/2021   MCV 86.6 06/09/2021   PLT 242 06/09/2021   NEUTROABS 4.9 06/09/2021    ASSESSMENT & PLAN:  No problem-specific Assessment & Plan notes found for this encounter.    No orders of the defined types were placed in this encounter.  The patient has a good understanding of the overall plan. she agrees with it. she will call with any problems that may develop before the next visit here. Total time spent: 30 mins including face to face time and time spent for planning, charting and co-ordination of care   Sherlyn Lick, CMA 06/28/22    I Janan Ridge am acting as a Neurosurgeon for [employer]  ***

## 2022-06-29 ENCOUNTER — Other Ambulatory Visit: Payer: Self-pay | Admitting: *Deleted

## 2022-06-29 DIAGNOSIS — Z170 Estrogen receptor positive status [ER+]: Secondary | ICD-10-CM

## 2022-06-30 ENCOUNTER — Inpatient Hospital Stay: Payer: PPO | Attending: Hematology and Oncology

## 2022-06-30 ENCOUNTER — Inpatient Hospital Stay (HOSPITAL_BASED_OUTPATIENT_CLINIC_OR_DEPARTMENT_OTHER): Payer: PPO | Admitting: Hematology and Oncology

## 2022-06-30 ENCOUNTER — Other Ambulatory Visit: Payer: Self-pay

## 2022-06-30 VITALS — BP 119/63 | HR 60 | Temp 97.7°F | Resp 18 | Ht 64.0 in | Wt 181.3 lb

## 2022-06-30 DIAGNOSIS — Z170 Estrogen receptor positive status [ER+]: Secondary | ICD-10-CM

## 2022-06-30 DIAGNOSIS — C50511 Malignant neoplasm of lower-outer quadrant of right female breast: Secondary | ICD-10-CM

## 2022-06-30 DIAGNOSIS — Z79811 Long term (current) use of aromatase inhibitors: Secondary | ICD-10-CM | POA: Diagnosis not present

## 2022-06-30 LAB — CBC WITH DIFFERENTIAL (CANCER CENTER ONLY)
Abs Immature Granulocytes: 0.02 10*3/uL (ref 0.00–0.07)
Basophils Absolute: 0.1 10*3/uL (ref 0.0–0.1)
Basophils Relative: 1 %
Eosinophils Absolute: 0.2 10*3/uL (ref 0.0–0.5)
Eosinophils Relative: 3 %
HCT: 41.3 % (ref 36.0–46.0)
Hemoglobin: 13.5 g/dL (ref 12.0–15.0)
Immature Granulocytes: 0 %
Lymphocytes Relative: 31 %
Lymphs Abs: 2 10*3/uL (ref 0.7–4.0)
MCH: 29.2 pg (ref 26.0–34.0)
MCHC: 32.7 g/dL (ref 30.0–36.0)
MCV: 89.2 fL (ref 80.0–100.0)
Monocytes Absolute: 0.4 10*3/uL (ref 0.1–1.0)
Monocytes Relative: 6 %
Neutro Abs: 3.8 10*3/uL (ref 1.7–7.7)
Neutrophils Relative %: 59 %
Platelet Count: 242 10*3/uL (ref 150–400)
RBC: 4.63 MIL/uL (ref 3.87–5.11)
RDW: 12.9 % (ref 11.5–15.5)
WBC Count: 6.5 10*3/uL (ref 4.0–10.5)
nRBC: 0 % (ref 0.0–0.2)

## 2022-06-30 LAB — CMP (CANCER CENTER ONLY)
ALT: 30 U/L (ref 0–44)
AST: 24 U/L (ref 15–41)
Albumin: 4.1 g/dL (ref 3.5–5.0)
Alkaline Phosphatase: 57 U/L (ref 38–126)
Anion gap: 9 (ref 5–15)
BUN: 12 mg/dL (ref 8–23)
CO2: 25 mmol/L (ref 22–32)
Calcium: 8.9 mg/dL (ref 8.9–10.3)
Chloride: 105 mmol/L (ref 98–111)
Creatinine: 0.82 mg/dL (ref 0.44–1.00)
GFR, Estimated: >60 mL/min (ref >60)
Glucose, Bld: 138 mg/dL — ABNORMAL HIGH (ref 70–99)
Potassium: 4.1 mmol/L (ref 3.5–5.1)
Sodium: 139 mmol/L (ref 135–145)
Total Bilirubin: 0.6 mg/dL (ref 0.3–1.2)
Total Protein: 6.3 g/dL — ABNORMAL LOW (ref 6.5–8.1)

## 2022-06-30 LAB — LIPASE, BLOOD: Lipase: 36 U/L (ref 11–51)

## 2022-06-30 LAB — AMYLASE: Amylase: 75 U/L (ref 28–100)

## 2022-06-30 NOTE — Assessment & Plan Note (Signed)
09/14/2016 Right lumpectomy: Small residual focus of IDC grade 1, 0.2 cm, margins negative, ER 100%, PR 95%, HER-2 negative, Ki-67 10%, T1a N0 stage IA (07/05/2016: Breast conserving surgery: No tumor detected on the pathological evaluation.) Adjuvant radiation therapy done in [place] Longstanding nonhealing wound right breast   Current treatment: Adjuvant antiestrogen therapy with anastrozole 1 mg daily started October 2018 completed June 2023   Hospitalization February 2021: Pancreatitis.  She has had a few episodes of this before.   Breast cancer surveillance: 1.  Breast exam: 06/30/2022: Benign.   2. Mammogram 01/2022: Benign, breast density category B   Patient gets mammograms at the [facility] in [place].      Return to clinic in 1 year for follow-up

## 2022-07-01 ENCOUNTER — Telehealth: Payer: Self-pay | Admitting: Hematology and Oncology

## 2022-07-01 NOTE — Telephone Encounter (Signed)
Scheduled appointment per 5/23 los. Left voicemail.

## 2022-07-07 DIAGNOSIS — I10 Essential (primary) hypertension: Secondary | ICD-10-CM | POA: Diagnosis not present

## 2022-07-07 DIAGNOSIS — Z6832 Body mass index (BMI) 32.0-32.9, adult: Secondary | ICD-10-CM | POA: Diagnosis not present

## 2022-07-07 DIAGNOSIS — M62830 Muscle spasm of back: Secondary | ICD-10-CM | POA: Diagnosis not present

## 2022-07-21 DIAGNOSIS — Z6831 Body mass index (BMI) 31.0-31.9, adult: Secondary | ICD-10-CM | POA: Diagnosis not present

## 2022-07-21 DIAGNOSIS — E11649 Type 2 diabetes mellitus with hypoglycemia without coma: Secondary | ICD-10-CM | POA: Diagnosis not present

## 2022-07-21 DIAGNOSIS — I700 Atherosclerosis of aorta: Secondary | ICD-10-CM | POA: Diagnosis not present

## 2022-07-21 DIAGNOSIS — M62830 Muscle spasm of back: Secondary | ICD-10-CM | POA: Diagnosis not present

## 2022-07-21 DIAGNOSIS — F330 Major depressive disorder, recurrent, mild: Secondary | ICD-10-CM | POA: Diagnosis not present

## 2022-07-21 DIAGNOSIS — E7849 Other hyperlipidemia: Secondary | ICD-10-CM | POA: Diagnosis not present

## 2022-07-21 DIAGNOSIS — I10 Essential (primary) hypertension: Secondary | ICD-10-CM | POA: Diagnosis not present

## 2022-09-20 DIAGNOSIS — E7849 Other hyperlipidemia: Secondary | ICD-10-CM | POA: Diagnosis not present

## 2022-09-20 DIAGNOSIS — I700 Atherosclerosis of aorta: Secondary | ICD-10-CM | POA: Diagnosis not present

## 2022-09-20 DIAGNOSIS — E1143 Type 2 diabetes mellitus with diabetic autonomic (poly)neuropathy: Secondary | ICD-10-CM | POA: Diagnosis not present

## 2022-09-20 DIAGNOSIS — I209 Angina pectoris, unspecified: Secondary | ICD-10-CM | POA: Diagnosis not present

## 2022-09-20 DIAGNOSIS — F330 Major depressive disorder, recurrent, mild: Secondary | ICD-10-CM | POA: Diagnosis not present

## 2022-09-20 DIAGNOSIS — Z6831 Body mass index (BMI) 31.0-31.9, adult: Secondary | ICD-10-CM | POA: Diagnosis not present

## 2022-09-20 DIAGNOSIS — I10 Essential (primary) hypertension: Secondary | ICD-10-CM | POA: Diagnosis not present

## 2022-09-20 DIAGNOSIS — M62830 Muscle spasm of back: Secondary | ICD-10-CM | POA: Diagnosis not present

## 2022-11-16 DIAGNOSIS — E1143 Type 2 diabetes mellitus with diabetic autonomic (poly)neuropathy: Secondary | ICD-10-CM | POA: Diagnosis not present

## 2022-11-16 DIAGNOSIS — I700 Atherosclerosis of aorta: Secondary | ICD-10-CM | POA: Diagnosis not present

## 2022-11-16 DIAGNOSIS — M62830 Muscle spasm of back: Secondary | ICD-10-CM | POA: Diagnosis not present

## 2022-11-16 DIAGNOSIS — F330 Major depressive disorder, recurrent, mild: Secondary | ICD-10-CM | POA: Diagnosis not present

## 2022-11-16 DIAGNOSIS — E7849 Other hyperlipidemia: Secondary | ICD-10-CM | POA: Diagnosis not present

## 2022-11-16 DIAGNOSIS — I10 Essential (primary) hypertension: Secondary | ICD-10-CM | POA: Diagnosis not present

## 2022-11-16 DIAGNOSIS — Z6831 Body mass index (BMI) 31.0-31.9, adult: Secondary | ICD-10-CM | POA: Diagnosis not present

## 2022-11-30 DIAGNOSIS — E1143 Type 2 diabetes mellitus with diabetic autonomic (poly)neuropathy: Secondary | ICD-10-CM | POA: Diagnosis not present

## 2022-11-30 DIAGNOSIS — I10 Essential (primary) hypertension: Secondary | ICD-10-CM | POA: Diagnosis not present

## 2022-11-30 DIAGNOSIS — Z6831 Body mass index (BMI) 31.0-31.9, adult: Secondary | ICD-10-CM | POA: Diagnosis not present

## 2022-11-30 DIAGNOSIS — M62830 Muscle spasm of back: Secondary | ICD-10-CM | POA: Diagnosis not present

## 2022-11-30 DIAGNOSIS — E7849 Other hyperlipidemia: Secondary | ICD-10-CM | POA: Diagnosis not present

## 2022-11-30 DIAGNOSIS — M79601 Pain in right arm: Secondary | ICD-10-CM | POA: Diagnosis not present

## 2022-11-30 DIAGNOSIS — F330 Major depressive disorder, recurrent, mild: Secondary | ICD-10-CM | POA: Diagnosis not present

## 2022-11-30 DIAGNOSIS — I700 Atherosclerosis of aorta: Secondary | ICD-10-CM | POA: Diagnosis not present

## 2022-12-15 DIAGNOSIS — Z01419 Encounter for gynecological examination (general) (routine) without abnormal findings: Secondary | ICD-10-CM | POA: Diagnosis not present

## 2022-12-15 DIAGNOSIS — Z90710 Acquired absence of both cervix and uterus: Secondary | ICD-10-CM | POA: Diagnosis not present

## 2022-12-15 DIAGNOSIS — Z853 Personal history of malignant neoplasm of breast: Secondary | ICD-10-CM | POA: Diagnosis not present

## 2023-02-09 DIAGNOSIS — I10 Essential (primary) hypertension: Secondary | ICD-10-CM | POA: Diagnosis not present

## 2023-02-09 DIAGNOSIS — Z0000 Encounter for general adult medical examination without abnormal findings: Secondary | ICD-10-CM | POA: Diagnosis not present

## 2023-02-09 DIAGNOSIS — E7849 Other hyperlipidemia: Secondary | ICD-10-CM | POA: Diagnosis not present

## 2023-02-09 DIAGNOSIS — M79604 Pain in right leg: Secondary | ICD-10-CM | POA: Diagnosis not present

## 2023-02-09 DIAGNOSIS — I700 Atherosclerosis of aorta: Secondary | ICD-10-CM | POA: Diagnosis not present

## 2023-02-09 DIAGNOSIS — F330 Major depressive disorder, recurrent, mild: Secondary | ICD-10-CM | POA: Diagnosis not present

## 2023-02-09 DIAGNOSIS — M62830 Muscle spasm of back: Secondary | ICD-10-CM | POA: Diagnosis not present

## 2023-02-09 DIAGNOSIS — E1143 Type 2 diabetes mellitus with diabetic autonomic (poly)neuropathy: Secondary | ICD-10-CM | POA: Diagnosis not present

## 2023-02-09 DIAGNOSIS — Z6832 Body mass index (BMI) 32.0-32.9, adult: Secondary | ICD-10-CM | POA: Diagnosis not present

## 2023-03-09 DIAGNOSIS — M79604 Pain in right leg: Secondary | ICD-10-CM | POA: Diagnosis not present

## 2023-03-09 DIAGNOSIS — M7989 Other specified soft tissue disorders: Secondary | ICD-10-CM | POA: Diagnosis not present

## 2023-03-09 DIAGNOSIS — R600 Localized edema: Secondary | ICD-10-CM | POA: Diagnosis not present

## 2023-03-09 DIAGNOSIS — M79661 Pain in right lower leg: Secondary | ICD-10-CM | POA: Diagnosis not present

## 2023-04-06 DIAGNOSIS — H40053 Ocular hypertension, bilateral: Secondary | ICD-10-CM | POA: Diagnosis not present

## 2023-04-06 DIAGNOSIS — H353191 Nonexudative age-related macular degeneration, unspecified eye, early dry stage: Secondary | ICD-10-CM | POA: Diagnosis not present

## 2023-04-06 DIAGNOSIS — E119 Type 2 diabetes mellitus without complications: Secondary | ICD-10-CM | POA: Diagnosis not present

## 2023-04-13 DIAGNOSIS — E1143 Type 2 diabetes mellitus with diabetic autonomic (poly)neuropathy: Secondary | ICD-10-CM | POA: Diagnosis not present

## 2023-04-13 DIAGNOSIS — I10 Essential (primary) hypertension: Secondary | ICD-10-CM | POA: Diagnosis not present

## 2023-04-13 DIAGNOSIS — E7849 Other hyperlipidemia: Secondary | ICD-10-CM | POA: Diagnosis not present

## 2023-04-13 DIAGNOSIS — Z1231 Encounter for screening mammogram for malignant neoplasm of breast: Secondary | ICD-10-CM | POA: Diagnosis not present

## 2023-04-13 DIAGNOSIS — Z853 Personal history of malignant neoplasm of breast: Secondary | ICD-10-CM | POA: Diagnosis not present

## 2023-04-13 DIAGNOSIS — F330 Major depressive disorder, recurrent, mild: Secondary | ICD-10-CM | POA: Diagnosis not present

## 2023-04-13 DIAGNOSIS — Z0000 Encounter for general adult medical examination without abnormal findings: Secondary | ICD-10-CM | POA: Diagnosis not present

## 2023-04-13 DIAGNOSIS — Z6832 Body mass index (BMI) 32.0-32.9, adult: Secondary | ICD-10-CM | POA: Diagnosis not present

## 2023-04-13 DIAGNOSIS — I700 Atherosclerosis of aorta: Secondary | ICD-10-CM | POA: Diagnosis not present

## 2023-04-13 DIAGNOSIS — M62830 Muscle spasm of back: Secondary | ICD-10-CM | POA: Diagnosis not present

## 2023-04-24 DIAGNOSIS — R928 Other abnormal and inconclusive findings on diagnostic imaging of breast: Secondary | ICD-10-CM | POA: Diagnosis not present

## 2023-04-24 DIAGNOSIS — N6489 Other specified disorders of breast: Secondary | ICD-10-CM | POA: Diagnosis not present

## 2023-05-18 DIAGNOSIS — H35363 Drusen (degenerative) of macula, bilateral: Secondary | ICD-10-CM | POA: Diagnosis not present

## 2023-05-18 DIAGNOSIS — H31013 Macula scars of posterior pole (postinflammatory) (post-traumatic), bilateral: Secondary | ICD-10-CM | POA: Diagnosis not present

## 2023-05-18 DIAGNOSIS — H2513 Age-related nuclear cataract, bilateral: Secondary | ICD-10-CM | POA: Diagnosis not present

## 2023-05-18 DIAGNOSIS — H35033 Hypertensive retinopathy, bilateral: Secondary | ICD-10-CM | POA: Diagnosis not present

## 2023-05-18 DIAGNOSIS — H31093 Other chorioretinal scars, bilateral: Secondary | ICD-10-CM | POA: Diagnosis not present

## 2023-05-22 DIAGNOSIS — C50511 Malignant neoplasm of lower-outer quadrant of right female breast: Secondary | ICD-10-CM | POA: Diagnosis not present

## 2023-05-22 DIAGNOSIS — D126 Benign neoplasm of colon, unspecified: Secondary | ICD-10-CM | POA: Diagnosis not present

## 2023-05-22 DIAGNOSIS — Z170 Estrogen receptor positive status [ER+]: Secondary | ICD-10-CM | POA: Diagnosis not present

## 2023-06-15 DIAGNOSIS — I10 Essential (primary) hypertension: Secondary | ICD-10-CM | POA: Diagnosis not present

## 2023-06-15 DIAGNOSIS — I700 Atherosclerosis of aorta: Secondary | ICD-10-CM | POA: Diagnosis not present

## 2023-06-15 DIAGNOSIS — E7849 Other hyperlipidemia: Secondary | ICD-10-CM | POA: Diagnosis not present

## 2023-06-15 DIAGNOSIS — E1122 Type 2 diabetes mellitus with diabetic chronic kidney disease: Secondary | ICD-10-CM | POA: Diagnosis not present

## 2023-06-15 DIAGNOSIS — Z6832 Body mass index (BMI) 32.0-32.9, adult: Secondary | ICD-10-CM | POA: Diagnosis not present

## 2023-06-15 DIAGNOSIS — M62830 Muscle spasm of back: Secondary | ICD-10-CM | POA: Diagnosis not present

## 2023-06-15 DIAGNOSIS — N182 Chronic kidney disease, stage 2 (mild): Secondary | ICD-10-CM | POA: Diagnosis not present

## 2023-06-15 DIAGNOSIS — D692 Other nonthrombocytopenic purpura: Secondary | ICD-10-CM | POA: Diagnosis not present

## 2023-06-15 DIAGNOSIS — F330 Major depressive disorder, recurrent, mild: Secondary | ICD-10-CM | POA: Diagnosis not present

## 2023-07-04 ENCOUNTER — Ambulatory Visit: Payer: PPO | Admitting: Hematology and Oncology

## 2023-07-05 ENCOUNTER — Inpatient Hospital Stay: Payer: Self-pay | Attending: Hematology and Oncology | Admitting: Hematology and Oncology

## 2023-07-05 VITALS — BP 136/67 | HR 67 | Temp 98.0°F | Resp 18 | Ht 64.0 in | Wt 184.6 lb

## 2023-07-05 DIAGNOSIS — C50511 Malignant neoplasm of lower-outer quadrant of right female breast: Secondary | ICD-10-CM | POA: Diagnosis not present

## 2023-07-05 DIAGNOSIS — Z1721 Progesterone receptor positive status: Secondary | ICD-10-CM | POA: Insufficient documentation

## 2023-07-05 DIAGNOSIS — Z79811 Long term (current) use of aromatase inhibitors: Secondary | ICD-10-CM | POA: Insufficient documentation

## 2023-07-05 DIAGNOSIS — Z170 Estrogen receptor positive status [ER+]: Secondary | ICD-10-CM | POA: Diagnosis not present

## 2023-07-05 NOTE — Assessment & Plan Note (Signed)
 09/14/2016 Right lumpectomy: Small residual focus of IDC grade 1, 0.2 cm, margins negative, ER 100%, PR 95%, HER-2 negative, Ki-67 10%, T1a N0 stage IA (07/05/2016: Breast conserving surgery: No tumor detected on the pathological evaluation.) Adjuvant radiation therapy done in [place] Longstanding nonhealing wound right breast   Current treatment: Adjuvant antiestrogen therapy with anastrozole  1 mg daily started October 2018 completed June 2023   Hospitalization February 2021: Pancreatitis.  She has had a few episodes of this before.   Breast cancer surveillance: 1.  Breast exam: 07/05/2023: Benign.   2. Mammogram 01/2023: Benign, breast density category B   Patient gets mammograms at the [facility] center in [place].   Patient's husband has metastatic colon cancer and is getting chemotherapy.  Return to clinic in 1 year for follow-up

## 2023-07-05 NOTE — Progress Notes (Signed)
 Patient Care Team: Veda Gerald, MD as PCP - General (Internal Medicine) Ayesha Lente, MD (Inactive) as Consulting Physician (General Surgery) Cameron Cea, MD as Consulting Physician (Hematology and Oncology) Colie Dawes, MD as Attending Physician (Radiation Oncology) Percival Brace, NP as Nurse Practitioner (Hematology and Oncology) Burnadette Carrion, MD as Referring Physician (Radiation Oncology)  DIAGNOSIS:  Encounter Diagnosis  Name Primary?   Malignant neoplasm of lower-outer quadrant of right breast of female, estrogen receptor positive (HCC) Yes    SUMMARY OF ONCOLOGIC HISTORY: Oncology History  Malignant neoplasm of lower-outer quadrant of right breast of female, estrogen receptor positive (HCC)  06/08/2016 Initial Diagnosis   Right breast asymmetry posteriorly 7 mm size, no ultrasound correlate, AFFIRM biopsy: Grade 2 invasive ductal carcinoma ER 100%, PR 95%, Ki-67 10%, HER-2 negative ratio 1.58, T1 BN 0 stage IA clinical stage   06/15/2016 Genetic Testing   Genetic testing did not reveal a deleterious mutation.  Variants of uncertain significance (VUSs) called DICER1 c.1468C>T (p.Arg490Cys) and MSH3 c.2041C>T (p.Pro681Ser) were also noted.  Genes tested include: APC, ATM, AXIN2, BARD1, BMPR1A, BRCA1, BRCA2, BRIP1, CDH1, CDKN2A, CHEK2, CTNNA1, DICER1, EPCAM, GREM1, HOXB13, KIT, MEN1, MLH1, MSH2, MSH3, MSH6, MUTYH, NBN, NF1, NTHL1, PALB2, PDGFRA, PMS2, POLD1, POLE, PTEN, RAD50, RAD51C, RAD51D, SDHA, SDHB, SDHC, SDHD, SMAD4, SMARCA4, STK11, TP53, TSC1, TSC2, and VHL.  UPDATE: MSH3 c.2041C>T (p.Pro681Ser) VUS was reclassified as "Likely Benign." The report date is 12/12/2017.  UPDATE: DICER1 c.1468C>T (p.Arg490Cys) VUS was reclassified as "Likely Benign". The report date is 10/26/2020.   07/06/2016 Surgery   Right lumpectomy: No tumor noted on the resected specimen   09/14/2016 Surgery   Right lumpectomy: Small residual focus of IDC grade 1, 0.2 cm, margins  negative, ER 100%, PR 95%, HER-2 negative, Ki-67 10%, T1a N0 stage IA   10/27/2016 - 11/15/2016 Radiation Therapy   Adjuvant radiation with Dr. Alden Humphrey breast: 42.4 Gy in 16 treatments, Right breast boost: 10 Gy in 4 treatments   11/2016 -  Anti-estrogen oral therapy   Anastrozole  daily   05/26/2017 Surgery   Breast abscess: Surgery performed     CHIEF COMPLIANT: Surveillance of breast cancer  HISTORY OF PRESENT ILLNESS:  History of Present Illness Tabitha [name] is a 68 year old female who presents with concerns about an abnormal mammogram.  Her last mammogram in March showed asymmetry in the right breast, possibly due to scar tissue from previous surgery. An ultrasound was performed on the same day. She is scheduled for a follow-up mammogram in six months, which is causing her anxiety.  She is under significant stress due to her husband's advanced colon cancer, for which she is the primary caregiver. She also cares for her three granddaughters, adding to her stress. Prozac has been prescribed to help manage her stress.     ALLERGIES:  is allergic to ampicillin, barium-containing compounds, penicillins, morphine and codeine, gadolinium derivatives, and iodinated contrast media.  MEDICATIONS:  Current Outpatient Medications  Medication Sig Dispense Refill   acetaminophen  (TYLENOL ) 325 MG tablet Take 325 mg by mouth daily as needed for moderate pain.      alendronate (FOSAMAX) 70 MG tablet Take 70 mg by mouth every Monday. Take with a full glass of water  on an empty stomach.      ALPRAZolam  (XANAX ) 0.5 MG tablet Take 0.5 mg by mouth at bedtime as needed for anxiety or sleep.      amLODipine  (NORVASC ) 5 MG tablet Take 5 mg by mouth daily.  anastrozole  (ARIMIDEX ) 1 MG tablet Take 1 tablet (1 mg total) by mouth daily. 90 tablet 4   atorvastatin  (LIPITOR) 20 MG tablet Take 20 mg by mouth every evening.      doxycycline (VIBRAMYCIN) 100 MG capsule Take 100 mg by mouth 2  (two) times daily.     lisinopril  (PRINIVIL ,ZESTRIL ) 40 MG tablet Take 40 mg by mouth daily.     metFORMIN (GLUCOPHAGE) 500 MG tablet Take 500 mg by mouth 2 (two) times daily.      Oxycodone  HCl 10 MG TABS Take 10 mg by mouth 3 (three) times daily.      Pancrelipase, Lip-Prot-Amyl, (CREON) 24000-76000 units CPEP Take 2 capsules by mouth 3 (three) times daily before meals.      pantoprazole  (PROTONIX ) 40 MG tablet Take 1 tablet (40 mg total) by mouth daily before breakfast. 30 tablet 5   triamterene -hydrochlorothiazide  (MAXZIDE ) 75-50 MG tablet Take 1 tablet by mouth daily.     No current facility-administered medications for this visit.    PHYSICAL EXAMINATION: ECOG PERFORMANCE STATUS: 1 - Symptomatic but completely ambulatory  Vitals:   07/05/23 1032  BP: 136/67  Pulse: 67  Resp: 18  Temp: 98 F (36.7 C)  SpO2: 100%   Filed Weights   07/05/23 1032  Weight: 184 lb 9.6 oz (83.7 kg)    Physical Exam   (exam performed in the presence of a chaperone)  LABORATORY DATA:  I have reviewed the data as listed    Latest Ref Rng & Units 06/30/2022    7:31 AM 06/09/2021    9:28 AM 05/07/2019    4:40 PM  CMP  Glucose 70 - 99 mg/dL 130  865  784   BUN 8 - 23 mg/dL 12  8  10    Creatinine 0.44 - 1.00 mg/dL 6.96  2.95  2.84   Sodium 135 - 145 mmol/L 139  136  140   Potassium 3.5 - 5.1 mmol/L 4.1  3.8  4.2   Chloride 98 - 111 mmol/L 105  104  105   CO2 22 - 32 mmol/L 25  24  29    Calcium  8.9 - 10.3 mg/dL 8.9  9.2  9.9   Total Protein 6.5 - 8.1 g/dL 6.3  7.2  6.8   Total Bilirubin 0.3 - 1.2 mg/dL 0.6  0.7  0.6   Alkaline Phos 38 - 126 U/L 57  62    AST 15 - 41 U/L 24  31  19    ALT 0 - 44 U/L 30  37  20     Lab Results  Component Value Date   WBC 6.5 06/30/2022   HGB 13.5 06/30/2022   HCT 41.3 06/30/2022   MCV 89.2 06/30/2022   PLT 242 06/30/2022   NEUTROABS 3.8 06/30/2022    ASSESSMENT & PLAN:  Malignant neoplasm of lower-outer quadrant of right breast of female, estrogen  receptor positive (HCC) 09/14/2016 Right lumpectomy: Small residual focus of IDC grade 1, 0.2 cm, margins negative, ER 100%, PR 95%, HER-2 negative, Ki-67 10%, T1a N0 stage IA (07/05/2016: Breast conserving surgery: No tumor detected on the pathological evaluation.) Adjuvant radiation therapy done in [place] Longstanding nonhealing wound right breast   Current treatment: Adjuvant antiestrogen therapy with anastrozole  1 mg daily started October 2018 completed June 2023   Hospitalization February 2021: Pancreatitis.  She has had a few episodes of this before.   Breast cancer surveillance: 1.  Breast exam: 07/05/2023: Benign.   2. Mammogram 01/2023: Benign, breast  density category B   Patient gets mammograms at the [facility] center in [place].   Patient's husband has metastatic colon cancer and is in bad shape getting chemotherapy.  Return to clinic in 1 year for follow-up ------------------------------------- Assessment and Plan Assessment & Plan Malignant neoplasm of lower-outer quadrant of right breast Abnormal mammogram showed asymmetry, likely scar tissue from prior surgery. Ultrasound performed. Follow-up imaging planned in six months. - Repeat mammogram in six months. - Continue follow-up with oncology.  Goals of Care Her husband has metastatic colon cancer with poor response to chemotherapy. Family is in denial about her prognosis, causing her significant stress. - Discuss hospice care options with family. - Encourage open communication about prognosis and care needs. - Consider support services for caregiver stress.      No orders of the defined types were placed in this encounter.  The patient has a good understanding of the overall plan. she agrees with it. she will call with any problems that may develop before the next visit here. Total time spent: 30 mins including face to face time and time spent for planning, charting and co-ordination of care   Margert Sheerer,  MD 07/05/23

## 2023-08-03 DIAGNOSIS — I2510 Atherosclerotic heart disease of native coronary artery without angina pectoris: Secondary | ICD-10-CM | POA: Diagnosis not present

## 2023-08-03 DIAGNOSIS — Z880 Allergy status to penicillin: Secondary | ICD-10-CM | POA: Diagnosis not present

## 2023-08-03 DIAGNOSIS — E119 Type 2 diabetes mellitus without complications: Secondary | ICD-10-CM | POA: Diagnosis not present

## 2023-08-03 DIAGNOSIS — I10 Essential (primary) hypertension: Secondary | ICD-10-CM | POA: Diagnosis not present

## 2023-08-03 DIAGNOSIS — Z9049 Acquired absence of other specified parts of digestive tract: Secondary | ICD-10-CM | POA: Diagnosis not present

## 2023-08-03 DIAGNOSIS — Z1211 Encounter for screening for malignant neoplasm of colon: Secondary | ICD-10-CM | POA: Diagnosis not present

## 2023-08-03 DIAGNOSIS — Z79899 Other long term (current) drug therapy: Secondary | ICD-10-CM | POA: Diagnosis not present

## 2023-08-03 DIAGNOSIS — K635 Polyp of colon: Secondary | ICD-10-CM | POA: Diagnosis not present

## 2023-08-03 DIAGNOSIS — Z7983 Long term (current) use of bisphosphonates: Secondary | ICD-10-CM | POA: Diagnosis not present

## 2023-08-03 DIAGNOSIS — K649 Unspecified hemorrhoids: Secondary | ICD-10-CM | POA: Diagnosis not present

## 2023-08-03 DIAGNOSIS — Z853 Personal history of malignant neoplasm of breast: Secondary | ICD-10-CM | POA: Diagnosis not present

## 2023-08-03 DIAGNOSIS — Z90710 Acquired absence of both cervix and uterus: Secondary | ICD-10-CM | POA: Diagnosis not present

## 2023-08-03 DIAGNOSIS — Z7984 Long term (current) use of oral hypoglycemic drugs: Secondary | ICD-10-CM | POA: Diagnosis not present

## 2023-08-03 DIAGNOSIS — K641 Second degree hemorrhoids: Secondary | ICD-10-CM | POA: Diagnosis not present

## 2023-08-03 DIAGNOSIS — D122 Benign neoplasm of ascending colon: Secondary | ICD-10-CM | POA: Diagnosis not present

## 2023-08-03 DIAGNOSIS — Z885 Allergy status to narcotic agent status: Secondary | ICD-10-CM | POA: Diagnosis not present

## 2023-08-03 DIAGNOSIS — Z860101 Personal history of adenomatous and serrated colon polyps: Secondary | ICD-10-CM | POA: Diagnosis not present

## 2023-08-03 DIAGNOSIS — E785 Hyperlipidemia, unspecified: Secondary | ICD-10-CM | POA: Diagnosis not present

## 2023-08-03 DIAGNOSIS — F17290 Nicotine dependence, other tobacco product, uncomplicated: Secondary | ICD-10-CM | POA: Diagnosis not present

## 2023-08-03 DIAGNOSIS — F419 Anxiety disorder, unspecified: Secondary | ICD-10-CM | POA: Diagnosis not present

## 2023-08-16 DIAGNOSIS — Z6832 Body mass index (BMI) 32.0-32.9, adult: Secondary | ICD-10-CM | POA: Diagnosis not present

## 2023-08-16 DIAGNOSIS — F330 Major depressive disorder, recurrent, mild: Secondary | ICD-10-CM | POA: Diagnosis not present

## 2023-08-16 DIAGNOSIS — I700 Atherosclerosis of aorta: Secondary | ICD-10-CM | POA: Diagnosis not present

## 2023-08-16 DIAGNOSIS — E1122 Type 2 diabetes mellitus with diabetic chronic kidney disease: Secondary | ICD-10-CM | POA: Diagnosis not present

## 2023-08-16 DIAGNOSIS — M62830 Muscle spasm of back: Secondary | ICD-10-CM | POA: Diagnosis not present

## 2023-08-16 DIAGNOSIS — E7849 Other hyperlipidemia: Secondary | ICD-10-CM | POA: Diagnosis not present

## 2023-08-16 DIAGNOSIS — I10 Essential (primary) hypertension: Secondary | ICD-10-CM | POA: Diagnosis not present

## 2023-08-16 DIAGNOSIS — I2510 Atherosclerotic heart disease of native coronary artery without angina pectoris: Secondary | ICD-10-CM | POA: Diagnosis not present

## 2023-08-16 DIAGNOSIS — C50919 Malignant neoplasm of unspecified site of unspecified female breast: Secondary | ICD-10-CM | POA: Diagnosis not present

## 2023-08-16 DIAGNOSIS — D692 Other nonthrombocytopenic purpura: Secondary | ICD-10-CM | POA: Diagnosis not present

## 2023-08-16 DIAGNOSIS — N182 Chronic kidney disease, stage 2 (mild): Secondary | ICD-10-CM | POA: Diagnosis not present

## 2023-08-23 DIAGNOSIS — K641 Second degree hemorrhoids: Secondary | ICD-10-CM | POA: Diagnosis not present

## 2023-08-23 DIAGNOSIS — D126 Benign neoplasm of colon, unspecified: Secondary | ICD-10-CM | POA: Diagnosis not present

## 2023-10-30 DIAGNOSIS — Z170 Estrogen receptor positive status [ER+]: Secondary | ICD-10-CM | POA: Diagnosis not present

## 2023-10-30 DIAGNOSIS — Z08 Encounter for follow-up examination after completed treatment for malignant neoplasm: Secondary | ICD-10-CM | POA: Diagnosis not present

## 2023-10-30 DIAGNOSIS — Z853 Personal history of malignant neoplasm of breast: Secondary | ICD-10-CM | POA: Diagnosis not present

## 2023-10-30 DIAGNOSIS — C50511 Malignant neoplasm of lower-outer quadrant of right female breast: Secondary | ICD-10-CM | POA: Diagnosis not present

## 2023-11-01 DIAGNOSIS — E7849 Other hyperlipidemia: Secondary | ICD-10-CM | POA: Diagnosis not present

## 2023-11-01 DIAGNOSIS — I10 Essential (primary) hypertension: Secondary | ICD-10-CM | POA: Diagnosis not present

## 2023-11-01 DIAGNOSIS — M62830 Muscle spasm of back: Secondary | ICD-10-CM | POA: Diagnosis not present

## 2023-11-01 DIAGNOSIS — E1122 Type 2 diabetes mellitus with diabetic chronic kidney disease: Secondary | ICD-10-CM | POA: Diagnosis not present

## 2023-11-01 DIAGNOSIS — C50919 Malignant neoplasm of unspecified site of unspecified female breast: Secondary | ICD-10-CM | POA: Diagnosis not present

## 2023-11-01 DIAGNOSIS — I700 Atherosclerosis of aorta: Secondary | ICD-10-CM | POA: Diagnosis not present

## 2023-11-01 DIAGNOSIS — N182 Chronic kidney disease, stage 2 (mild): Secondary | ICD-10-CM | POA: Diagnosis not present

## 2023-11-01 DIAGNOSIS — Z6831 Body mass index (BMI) 31.0-31.9, adult: Secondary | ICD-10-CM | POA: Diagnosis not present

## 2023-11-01 DIAGNOSIS — D692 Other nonthrombocytopenic purpura: Secondary | ICD-10-CM | POA: Diagnosis not present

## 2023-11-01 DIAGNOSIS — F330 Major depressive disorder, recurrent, mild: Secondary | ICD-10-CM | POA: Diagnosis not present

## 2023-11-01 DIAGNOSIS — I2510 Atherosclerotic heart disease of native coronary artery without angina pectoris: Secondary | ICD-10-CM | POA: Diagnosis not present

## 2023-11-13 DIAGNOSIS — C50511 Malignant neoplasm of lower-outer quadrant of right female breast: Secondary | ICD-10-CM | POA: Diagnosis not present

## 2023-11-13 DIAGNOSIS — Z170 Estrogen receptor positive status [ER+]: Secondary | ICD-10-CM | POA: Diagnosis not present

## 2023-11-13 DIAGNOSIS — K429 Umbilical hernia without obstruction or gangrene: Secondary | ICD-10-CM | POA: Diagnosis not present

## 2023-11-13 DIAGNOSIS — R1084 Generalized abdominal pain: Secondary | ICD-10-CM | POA: Diagnosis not present

## 2023-11-23 DIAGNOSIS — K429 Umbilical hernia without obstruction or gangrene: Secondary | ICD-10-CM | POA: Diagnosis not present

## 2023-11-23 DIAGNOSIS — K432 Incisional hernia without obstruction or gangrene: Secondary | ICD-10-CM | POA: Diagnosis not present

## 2023-12-07 NOTE — Progress Notes (Signed)
 Tabitha [name]                                          MRN: [id number]   12/07/2023   The [facility] Quality Team Specialist reviewed this patient medical record for the purposes of chart review for care gap closure. The following were reviewed: chart review for care gap closure-kidney health evaluation for diabetes:uACR.    [facility] Quality Team

## 2023-12-27 DIAGNOSIS — I2510 Atherosclerotic heart disease of native coronary artery without angina pectoris: Secondary | ICD-10-CM | POA: Diagnosis not present

## 2023-12-27 DIAGNOSIS — M62830 Muscle spasm of back: Secondary | ICD-10-CM | POA: Diagnosis not present

## 2023-12-27 DIAGNOSIS — E7849 Other hyperlipidemia: Secondary | ICD-10-CM | POA: Diagnosis not present

## 2023-12-27 DIAGNOSIS — F330 Major depressive disorder, recurrent, mild: Secondary | ICD-10-CM | POA: Diagnosis not present

## 2023-12-27 DIAGNOSIS — D692 Other nonthrombocytopenic purpura: Secondary | ICD-10-CM | POA: Diagnosis not present

## 2023-12-27 DIAGNOSIS — Z6831 Body mass index (BMI) 31.0-31.9, adult: Secondary | ICD-10-CM | POA: Diagnosis not present

## 2023-12-27 DIAGNOSIS — N182 Chronic kidney disease, stage 2 (mild): Secondary | ICD-10-CM | POA: Diagnosis not present

## 2023-12-27 DIAGNOSIS — C50919 Malignant neoplasm of unspecified site of unspecified female breast: Secondary | ICD-10-CM | POA: Diagnosis not present

## 2023-12-27 DIAGNOSIS — E1122 Type 2 diabetes mellitus with diabetic chronic kidney disease: Secondary | ICD-10-CM | POA: Diagnosis not present

## 2023-12-27 DIAGNOSIS — I700 Atherosclerosis of aorta: Secondary | ICD-10-CM | POA: Diagnosis not present

## 2023-12-27 DIAGNOSIS — I10 Essential (primary) hypertension: Secondary | ICD-10-CM | POA: Diagnosis not present

## 2024-07-03 ENCOUNTER — Ambulatory Visit: Admitting: Hematology and Oncology
